# Patient Record
Sex: Male | Born: 1940 | Race: White | Hispanic: No | Marital: Married | Smoking: Current some day smoker
Health system: Southern US, Community
[De-identification: ages and names within clinical notes are randomized; demographics above are authoritative.]

## PROBLEM LIST (undated history)

## (undated) DIAGNOSIS — K219 Gastro-esophageal reflux disease without esophagitis: Secondary | ICD-10-CM

## (undated) DIAGNOSIS — J449 Chronic obstructive pulmonary disease, unspecified: Secondary | ICD-10-CM

## (undated) DIAGNOSIS — J984 Other disorders of lung: Secondary | ICD-10-CM

## (undated) DIAGNOSIS — I10 Essential (primary) hypertension: Secondary | ICD-10-CM

## (undated) DIAGNOSIS — C801 Malignant (primary) neoplasm, unspecified: Secondary | ICD-10-CM

## (undated) DIAGNOSIS — I739 Peripheral vascular disease, unspecified: Secondary | ICD-10-CM

## (undated) DIAGNOSIS — F419 Anxiety disorder, unspecified: Secondary | ICD-10-CM

## (undated) DIAGNOSIS — M1990 Unspecified osteoarthritis, unspecified site: Secondary | ICD-10-CM

## (undated) DIAGNOSIS — E785 Hyperlipidemia, unspecified: Secondary | ICD-10-CM

## (undated) HISTORY — DX: Hyperlipidemia, unspecified: E78.5

## (undated) HISTORY — PX: CATARACT EXTRACTION: SUR2

## (undated) HISTORY — DX: Anxiety disorder, unspecified: F41.9

## (undated) HISTORY — DX: Gastro-esophageal reflux disease without esophagitis: K21.9

## (undated) HISTORY — DX: Chronic obstructive pulmonary disease, unspecified: J44.9

## (undated) HISTORY — DX: Peripheral vascular disease, unspecified: I73.9

## (undated) HISTORY — DX: Unspecified osteoarthritis, unspecified site: M19.90

## (undated) HISTORY — DX: Malignant (primary) neoplasm, unspecified: C80.1

## (undated) HISTORY — DX: Other disorders of lung: J98.4

## (undated) HISTORY — DX: Essential (primary) hypertension: I10

---

## 2002-07-15 ENCOUNTER — Encounter: Payer: Self-pay | Admitting: Emergency Medicine

## 2002-07-15 ENCOUNTER — Emergency Department (HOSPITAL_COMMUNITY): Admission: EM | Admit: 2002-07-15 | Discharge: 2002-07-15 | Payer: Self-pay | Admitting: Emergency Medicine

## 2005-07-09 ENCOUNTER — Ambulatory Visit: Payer: Self-pay | Admitting: Cardiology

## 2006-04-29 ENCOUNTER — Ambulatory Visit: Payer: Self-pay | Admitting: Cardiovascular Disease

## 2008-02-04 ENCOUNTER — Emergency Department (HOSPITAL_COMMUNITY): Admission: EM | Admit: 2008-02-04 | Discharge: 2008-02-04 | Payer: Self-pay | Admitting: Emergency Medicine

## 2010-05-01 LAB — APTT: aPTT: 29 seconds (ref 24–37)

## 2010-05-01 LAB — CBC
HCT: 46.1 % (ref 39.0–52.0)
MCV: 93.5 fL (ref 78.0–100.0)

## 2010-05-01 LAB — DIFFERENTIAL
Basophils Relative: 1 % (ref 0–1)
Lymphocytes Relative: 31 % (ref 12–46)
Neutro Abs: 3.1 10*3/uL (ref 1.7–7.7)
Neutrophils Relative %: 54 % (ref 43–77)

## 2010-05-01 LAB — PROTIME-INR: INR: 1 (ref 0.00–1.49)

## 2010-06-02 NOTE — Assessment & Plan Note (Signed)
[name]                       [place] CARDIOLOGY OFFICE NOTE   [name], [name]                      MRN:          [id number]  DATE:04/29/2006                            DOB:          1940/12/06    [name] is seen today as a new consult at the request of [facility] Department.  Apparently, the patient was seen last year at some  point in [place].  He does not know the doctor who saw him.  He has been  having substernal chest pain.  The patient is an extremely heavy smoker,  over two packs per day.  His chest pain syndrome sounds like angina.  It  is exertional.  It is relieved with rest.  There is some radiation to  the back.  He also may have symptoms of claudication.   The patient unfortunately has been quite depressed.  He had a suicide  attempt back a few months ago.  Apparently, he took too many Xanax.  The  patient did not want to talk about all of his problems.  I tried on  multiple occasions to engage him, but he said he would rather not.  The  patient is married and says his wife is not part of the problem and has  been supportive.   The patient's past medical history is remarkable for some dizziness,  which may have been vertigo as well as some headaches.  He has had some  reflux and epigastric discomfort in the past.   He has been on lisinopril/hydrochlorothiazide for high blood pressure  since about the middle of last year.   I do not have an old EKG on the patient.  He has not been hospitalized  for his heart.  He was hospitalized in 2007 for his drug overdose.   His current medications include lisinopril/hydrochlorothiazide 10/12.5  and p.r.n. nitroglycerin.   His only past medical history is significant for a suicide attempt and  hypertension.   He has not had any previous surgery.   He is married.  They have seven children.  He has over a 60-pack-year  history of smoking.  He does not drink alcohol.  He  did not want to get  into issues regarding his significant depression.   His mother died at the age of 76 of coronary disease.  Father died at  the age of 13 of unknown causes.   A 10-point review of systems is remarkable for a bit of a chronic cough  with productive sputum.  He has not had a fever.  He has not had recent  PFTs.  The patient does describe symptoms that could be consistent with  claudication.  The 10-point review of systems is otherwise only  remarkable for occasional headaches and a chest pain syndrome.   PHYSICAL EXAMINATION:  VITAL SIGNS:  His exam is remarkable for a blood  pressure of 130/70, pulse 88 and regular.  HEENT:  Normal.  NECK:  Carotids are without bruit.  LUNGS:  Clear.  There is no wheezing.  CARDIAC:  There is an S1 and S2 with normal heart  sounds.  ABDOMEN:  Benign.  There are no renal bruits.  EXTREMITIES:  Distal pulses are intact with no edema.  There are no  femoral bruits.   IMPRESSION:  Will have to try to get records from [place].  Unfortunately,  the patient does not recall who his doctor was.  The patient clearly  needs a heart catheterization.  He has multiple coronary risk factors  and is having exertional angina.  The risks of catheterization were  discussed with him, including stroke, need for emergency surgery, and  dye reaction.  He is willing to proceed.  We will do an EKG on him today  to see if there is evidence of previous silent myocardial infarction or  coronary disease.  He will have this routine blood work and the chest x-  ray.   Depending on the results of his cath, he will need extensive risk factor  modification.  It will be difficult to get him to stop smoking, but he  will probably need baseline PFTs.   He will continue to follow up with [facility] Department in  regards to his depression.     Noralyn Pick. Eden Emms, MD, [facility]  Electronically Signed    PCN/MedQ  DD: 04/29/2006  DT: 04/30/2006  Job #: [id number]

## 2010-07-14 ENCOUNTER — Other Ambulatory Visit: Payer: Self-pay

## 2010-07-14 ENCOUNTER — Encounter (HOSPITAL_COMMUNITY)
Admission: RE | Admit: 2010-07-14 | Discharge: 2010-07-14 | Disposition: A | Discharge status: Home / Self Care | Payer: Medicare Other | Source: Ambulatory Visit | Attending: Ophthalmology | Admitting: Ophthalmology

## 2010-07-14 LAB — BASIC METABOLIC PANEL
BUN: 13 mg/dL (ref 6–23)
CO2: 26 mEq/L (ref 19–32)
Chloride: 108 mEq/L (ref 96–112)
Creatinine, Ser: 1.29 mg/dL (ref 0.50–1.35)
Glucose, Bld: 75 mg/dL (ref 70–99)

## 2010-07-20 ENCOUNTER — Ambulatory Visit (HOSPITAL_COMMUNITY)
Admission: RE | Admit: 2010-07-20 | Discharge: 2010-07-20 | Disposition: A | Discharge status: Home / Self Care | Payer: Medicare Other | Source: Ambulatory Visit | Attending: Ophthalmology | Admitting: Ophthalmology

## 2010-07-20 DIAGNOSIS — I10 Essential (primary) hypertension: Secondary | ICD-10-CM | POA: Insufficient documentation

## 2010-07-20 DIAGNOSIS — Z01812 Encounter for preprocedural laboratory examination: Secondary | ICD-10-CM | POA: Insufficient documentation

## 2010-07-20 DIAGNOSIS — H2589 Other age-related cataract: Secondary | ICD-10-CM | POA: Insufficient documentation

## 2010-07-20 DIAGNOSIS — J4489 Other specified chronic obstructive pulmonary disease: Secondary | ICD-10-CM | POA: Insufficient documentation

## 2010-07-20 DIAGNOSIS — J449 Chronic obstructive pulmonary disease, unspecified: Secondary | ICD-10-CM | POA: Insufficient documentation

## 2010-07-20 DIAGNOSIS — Z01810 Encounter for preprocedural cardiovascular examination: Secondary | ICD-10-CM | POA: Insufficient documentation

## 2010-07-24 NOTE — Op Note (Signed)
  NAMEBRENNEN, CAMPER NO.:  [id number]  MEDICAL RECORD NO.:  [id number]  LOCATION:  [facility]                          FACILITY:  [facility]  PHYSICIAN:  Susanne Greenhouse, MD       DATE OF BIRTH:  [birth year]  DATE OF PROCEDURE:  07/20/2010 DATE OF DISCHARGE:                              OPERATIVE REPORT   PREOPERATIVE DIAGNOSIS:  Combined cataract, left eye, diagnosis code 366.19.  POSTOPERATIVE DIAGNOSIS:  Combined cataract, left eye, diagnosis code 366.19.  OPERATION PERFORMED:  Phacoemulsification with posterior chamber intraocular lens implantation, left eye.  SURGEON:  Bonne Dolores. Ernesto Lashway, MD  ANESTHESIA:  General endotracheal anesthesia.  OPERATIVE SUMMARY:  In the preoperative area, dilating drops were placed into the left eye.  The patient was then brought into the operating room where he was placed under general anesthesia.  The eye was then prepped and draped.  Beginning with a 75 blade, a paracentesis port was made at the surgeon's 2 o'clock position.  The anterior chamber was then filled with a 1% nonpreserved lidocaine solution with epinephrine.  This was followed by Viscoat to deepen the chamber.  A small fornix-based peritomy was performed superiorly.  Next, a single iris hook was placed through the limbus superiorly.  A 2.4-mm keratome blade was then used to make a clear corneal incision over the iris hook.  A bent cystotome needle and Utrata forceps were used to create a continuous tear capsulotomy.  Hydrodissection was performed using balanced salt solution on a fine cannula.  The lens nucleus was then removed using phacoemulsification in a quadrant cracking technique.  The cortical material was then removed with irrigation and aspiration.  The capsular bag and anterior chamber were refilled with Provisc.  The wound was widened to approximately 3 mm and a posterior chamber intraocular lens was placed into the capsular bag without difficulty using an  [employer] lens injecting system.  A single 10-0 nylon suture was then used to close the incision as well as stromal hydration.  The Provisc was removed from the anterior chamber and capsular bag with irrigation and aspiration.  At this point, the wounds were tested for leak, which were negative.  The anterior chamber remained deep and stable.  The patient tolerated the procedure well.  There were no operative complications, and he awoke from general anesthesia without problem.  No surgical specimens.  Prosthetic device used is a Lenstec posterior chamber lens, model Softec HD, power of 22.5, serial number is [id number].          ______________________________ Susanne Greenhouse, MD     KEH/MEDQ  D:  07/20/2010  T:  07/21/2010  Job:  [id number]  Electronically Signed by Gemma Payor MD on 07/24/2010 12:37:30 PM

## 2010-10-15 ENCOUNTER — Other Ambulatory Visit: Payer: Self-pay | Admitting: Family Medicine

## 2011-03-01 ENCOUNTER — Ambulatory Visit: Payer: Medicare Other | Admitting: Family Medicine

## 2011-03-14 ENCOUNTER — Encounter: Payer: Self-pay | Admitting: Family Medicine

## 2011-03-14 ENCOUNTER — Ambulatory Visit (INDEPENDENT_AMBULATORY_CARE_PROVIDER_SITE_OTHER): Payer: Medicare Other | Admitting: Family Medicine

## 2011-03-14 VITALS — BP 160/80 | HR 110 | Resp 18 | Ht 66.25 in | Wt 138.0 lb

## 2011-03-14 DIAGNOSIS — J449 Chronic obstructive pulmonary disease, unspecified: Secondary | ICD-10-CM

## 2011-03-14 DIAGNOSIS — M2550 Pain in unspecified joint: Secondary | ICD-10-CM | POA: Insufficient documentation

## 2011-03-14 DIAGNOSIS — E785 Hyperlipidemia, unspecified: Secondary | ICD-10-CM

## 2011-03-14 DIAGNOSIS — C4490 Unspecified malignant neoplasm of skin, unspecified: Secondary | ICD-10-CM

## 2011-03-14 DIAGNOSIS — F418 Other specified anxiety disorders: Secondary | ICD-10-CM | POA: Insufficient documentation

## 2011-03-14 DIAGNOSIS — F411 Generalized anxiety disorder: Secondary | ICD-10-CM

## 2011-03-14 DIAGNOSIS — I10 Essential (primary) hypertension: Secondary | ICD-10-CM | POA: Insufficient documentation

## 2011-03-14 DIAGNOSIS — F419 Anxiety disorder, unspecified: Secondary | ICD-10-CM

## 2011-03-14 DIAGNOSIS — I739 Peripheral vascular disease, unspecified: Secondary | ICD-10-CM

## 2011-03-14 MED ORDER — AMLODIPINE BESYLATE 10 MG PO TABS: 10.0000 mg | ORAL_TABLET | Freq: Every day | ORAL | Status: DC

## 2011-03-14 MED ORDER — TIOTROPIUM BROMIDE MONOHYDRATE 18 MCG IN CAPS: 18.0000 ug | ORAL_CAPSULE | Freq: Every day | RESPIRATORY_TRACT | Status: DC

## 2011-03-14 MED ORDER — ALBUTEROL SULFATE HFA 108 (90 BASE) MCG/ACT IN AERS: 2.0000 | INHALATION_SPRAY | RESPIRATORY_TRACT | Status: DC | PRN

## 2011-03-14 NOTE — Assessment & Plan Note (Signed)
Likely has underlying osteoarthritis. At this point his pain does not bother his regular activities. He is in fairly good shape for his age.

## 2011-03-14 NOTE — Assessment & Plan Note (Signed)
Currently at baseline. Reassure the importance of cutting back on his tobacco or cessation. He will continue Spiriva and ProAir

## 2011-03-14 NOTE — Assessment & Plan Note (Signed)
He currently does not seem to have a problem with his nervousness. We'll continue to follow.

## 2011-03-14 NOTE — Progress Notes (Signed)
  Subjective:    Patient ID: [name], male    DOB: 1940/09/12, 71 y.o.   MRN: [id number]  HPI Patient presents to establish care. Last primary care provider Dr. Margo Aye in [facility]. Medications and history reviewed.  COPD- he's been smoking for greater than 50 years approximately one to one and a half packs per day. He is maintained on Provera and Spiriva. He needs these medications refilled today. He does have shortness of breath at times. He feels his breathing is at baseline currently. He's tried Chantix in the past which did not agree with him. He is also try patches. He's not ready to quit smoking.  Peripheral vascular disease- he had ABIs done about a year ago which suggested decreased blood flow in the right lower extremity. At times he has on and off discomfort in the right leg and a cold feeling.  Hypertension- he has not been taking his Hyzaar as he felt this is making him feel bad along with his Zoloft and Pravachol   Anxiety- states he had some stress regarding his wife in the past. He was feeling really nervous and anxious. That has since passed. He was started on Zoloft by his previous PCP however he only took one tablet and stop this medication   Joint pain- occasionally gets pain in his hips right greater than left when he walks however this does not occur on a regular basis. He does not take any medications for this.  Constipation- he takes over-the-counter stool softeners Skin cancer- he was told he had skin cancer on his head from direct sunlight. He was seen by a dermatologist but this was greater than 10 years ago. He has not follow up with dermatology since then. At this time he does not want to.  Colonoscopy- patient continues to decline  Review of Systems  GEN- denies fatigue, fever, weight loss,weakness, recent illness HEENT- denies eye drainage, change in vision, nasal discharge, CVS- denies chest pain, palpitations RESP- + SOB, cough, wheeze ABD-  denies N/V, change in stools, abd pain, +constipation GU- denies dysuria, hematuria, dribbling, incontinence, +urgency MSK- Occ shoulder joint pain, muscle aches, injury Neuro- denies headache, dizziness, syncope, seizure activity      Objective:   Physical Exam GEN- NAD, alert and oriented x3 HEENT- PERRL, EOMI, non injected sclera, pink conjunctiva, MMM, oropharynx clear, right cataract Neck- Supple, no bruit CVS- Regular rate- HR 90, occ PVC, no murmur RESP-CTAB, good air movement, no wheeze, no rhonchi ABD-NABS,soft, NT, ND EXT- No edema Pulses- Radial 2+, Right leg- DP 2+ PT 1+, Left PT/DP 2+ Psych- not anxious or depressed appearing, no hallucinations, normal mentation and speech Skin- keratoses noted, probable BCC on left forehead, many sunspots       Assessment & Plan:

## 2011-03-14 NOTE — Patient Instructions (Signed)
Start the new blood pressure- amlodipine  Continue the spiriva and Pro-Air  F/U in 3 weeks for your blood pressure

## 2011-03-14 NOTE — Assessment & Plan Note (Signed)
He stopped his medication secondary to feeling bad however he does not know if it was the Zoloft versus pravastatin versus his Hyzaar. He was on lisinopril HCTZ present no to he stopped this as well because of side effects. I will try him on amlodipine instead

## 2011-03-14 NOTE — Assessment & Plan Note (Signed)
He has rare symptoms at this point. I will hold on referral to vascular at this time. I did discuss with him his vascular disease is worsening because of his tobacco

## 2011-03-14 NOTE — Assessment & Plan Note (Signed)
It appears he has had basal cell carcinoma. He does not want any followup at this time. Will continue to watch any concerning lesions

## 2011-03-14 NOTE — Assessment & Plan Note (Signed)
His lipid panel from July of 2012 shows total cholesterol 2:30 HDL 32 LDL 160 triglycerides 188. At this time I will start one medication that time to make sure he does not have side effects. We will then try to reinstate his pravastatin

## 2011-03-16 ENCOUNTER — Telehealth: Payer: Self-pay | Admitting: Family Medicine

## 2011-03-16 MED ORDER — PREDNISONE 20 MG PO TABS: 20.0000 mg | ORAL_TABLET | Freq: Every day | ORAL | Status: AC

## 2011-03-16 MED ORDER — DOXYCYCLINE HYCLATE 100 MG PO TABS: 100.0000 mg | ORAL_TABLET | Freq: Two times a day (BID) | ORAL | Status: AC

## 2011-03-16 NOTE — Telephone Encounter (Signed)
Spoke with pt heavy smoker and COPD , he feels SOB, with wheezing and congestion and rattling his his chest. Also has nasal congestion Will send prednisone and doxycycline

## 2011-03-16 NOTE — Telephone Encounter (Signed)
He was just here on the 27th. He thinks he has a cold. Having some SOB No cough, clear nasal congestion. Suggested Sudafed and saline spray. Do you recommend anything else?  [facility]

## 2011-04-02 ENCOUNTER — Telehealth: Payer: Self-pay | Admitting: Family Medicine

## 2011-04-02 MED ORDER — PREDNISONE 10 MG PO TABS: ORAL_TABLET | ORAL | Status: DC

## 2011-04-02 MED ORDER — LEVOFLOXACIN 750 MG PO TABS: 750.0000 mg | ORAL_TABLET | Freq: Every day | ORAL | Status: DC

## 2011-04-02 NOTE — Telephone Encounter (Addendum)
Mr. Thum wife came in today, he his having increased cough and sputum production, he  improved with the antibiotics previously given but now has recurrent symptoms He has f/u appt on Thursday, will send in levaquin and prednisone and check him in the office

## 2011-04-04 ENCOUNTER — Emergency Department (HOSPITAL_COMMUNITY): Payer: Medicare Other

## 2011-04-04 ENCOUNTER — Other Ambulatory Visit: Payer: Self-pay

## 2011-04-04 ENCOUNTER — Inpatient Hospital Stay (HOSPITAL_COMMUNITY)
Admission: EM | Admit: 2011-04-04 | Discharge: 2011-04-07 | DRG: 208 | Disposition: A | Discharge status: Other Acute Inpatient Hospital | Payer: Medicare Other | Attending: Internal Medicine | Admitting: Internal Medicine

## 2011-04-04 ENCOUNTER — Encounter (HOSPITAL_COMMUNITY): Payer: Self-pay

## 2011-04-04 DIAGNOSIS — M2550 Pain in unspecified joint: Secondary | ICD-10-CM

## 2011-04-04 DIAGNOSIS — J449 Chronic obstructive pulmonary disease, unspecified: Secondary | ICD-10-CM

## 2011-04-04 DIAGNOSIS — F418 Other specified anxiety disorders: Secondary | ICD-10-CM | POA: Diagnosis present

## 2011-04-04 DIAGNOSIS — F29 Unspecified psychosis not due to a substance or known physiological condition: Secondary | ICD-10-CM | POA: Diagnosis present

## 2011-04-04 DIAGNOSIS — R0600 Dyspnea, unspecified: Secondary | ICD-10-CM

## 2011-04-04 DIAGNOSIS — Z720 Tobacco use: Secondary | ICD-10-CM

## 2011-04-04 DIAGNOSIS — E870 Hyperosmolality and hypernatremia: Secondary | ICD-10-CM | POA: Diagnosis not present

## 2011-04-04 DIAGNOSIS — Z79899 Other long term (current) drug therapy: Secondary | ICD-10-CM

## 2011-04-04 DIAGNOSIS — F17200 Nicotine dependence, unspecified, uncomplicated: Secondary | ICD-10-CM | POA: Diagnosis present

## 2011-04-04 DIAGNOSIS — I739 Peripheral vascular disease, unspecified: Secondary | ICD-10-CM | POA: Diagnosis present

## 2011-04-04 DIAGNOSIS — M1710 Unilateral primary osteoarthritis, unspecified knee: Secondary | ICD-10-CM | POA: Diagnosis present

## 2011-04-04 DIAGNOSIS — C4490 Unspecified malignant neoplasm of skin, unspecified: Secondary | ICD-10-CM

## 2011-04-04 DIAGNOSIS — R0902 Hypoxemia: Secondary | ICD-10-CM

## 2011-04-04 DIAGNOSIS — F411 Generalized anxiety disorder: Secondary | ICD-10-CM | POA: Diagnosis present

## 2011-04-04 DIAGNOSIS — E872 Acidosis: Secondary | ICD-10-CM

## 2011-04-04 DIAGNOSIS — J9620 Acute and chronic respiratory failure, unspecified whether with hypoxia or hypercapnia: Secondary | ICD-10-CM

## 2011-04-04 DIAGNOSIS — R0689 Other abnormalities of breathing: Secondary | ICD-10-CM

## 2011-04-04 DIAGNOSIS — E785 Hyperlipidemia, unspecified: Secondary | ICD-10-CM

## 2011-04-04 DIAGNOSIS — F419 Anxiety disorder, unspecified: Secondary | ICD-10-CM

## 2011-04-04 DIAGNOSIS — K219 Gastro-esophageal reflux disease without esophagitis: Secondary | ICD-10-CM | POA: Diagnosis present

## 2011-04-04 DIAGNOSIS — I10 Essential (primary) hypertension: Secondary | ICD-10-CM

## 2011-04-04 DIAGNOSIS — IMO0002 Reserved for concepts with insufficient information to code with codable children: Secondary | ICD-10-CM

## 2011-04-04 DIAGNOSIS — M19049 Primary osteoarthritis, unspecified hand: Secondary | ICD-10-CM | POA: Diagnosis present

## 2011-04-04 DIAGNOSIS — J441 Chronic obstructive pulmonary disease with (acute) exacerbation: Secondary | ICD-10-CM | POA: Diagnosis present

## 2011-04-04 LAB — DIFFERENTIAL
Lymphocytes Relative: 10 % — ABNORMAL LOW (ref 12–46)
Lymphs Abs: 0.9 10*3/uL (ref 0.7–4.0)
Monocytes Absolute: 0.8 10*3/uL (ref 0.1–1.0)
Monocytes Relative: 9 % (ref 3–12)
Neutro Abs: 6.7 10*3/uL (ref 1.7–7.7)
Neutrophils Relative %: 81 % — ABNORMAL HIGH (ref 43–77)

## 2011-04-04 LAB — URINE MICROSCOPIC-ADD ON

## 2011-04-04 LAB — BASIC METABOLIC PANEL
CO2: 29 mEq/L (ref 19–32)
Calcium: 10.1 mg/dL (ref 8.4–10.5)
Creatinine, Ser: 1.17 mg/dL (ref 0.50–1.35)
Glucose, Bld: 88 mg/dL (ref 70–99)

## 2011-04-04 LAB — TROPONIN I: Troponin I: <0.3 ng/mL (ref <0.30)

## 2011-04-04 LAB — URINALYSIS, ROUTINE W REFLEX MICROSCOPIC
Bilirubin Urine: NEGATIVE
Glucose, UA: NEGATIVE mg/dL
Nitrite: NEGATIVE
Specific Gravity, Urine: >1.03 — ABNORMAL HIGH (ref 1.005–1.030)
pH: 5.5 (ref 5.0–8.0)

## 2011-04-04 LAB — CBC
HCT: 51.8 % (ref 39.0–52.0)
MCHC: 34 g/dL (ref 30.0–36.0)

## 2011-04-04 MED ORDER — SODIUM CHLORIDE 0.9 % IV SOLN
INTRAVENOUS | Status: DC
  Administered 2011-04-04: 20:00:00 via INTRAVENOUS

## 2011-04-04 MED ORDER — IOHEXOL 350 MG/ML SOLN
100.0000 mL | Freq: Once | INTRAVENOUS | Status: AC | PRN
  Administered 2011-04-04: 100 mL via INTRAVENOUS

## 2011-04-04 MED ORDER — PREDNISONE 20 MG PO TABS
20.0000 mg | ORAL_TABLET | Freq: Once | ORAL | Status: AC
  Administered 2011-04-04: 20 mg via ORAL
  Filled 2011-04-04: qty 1

## 2011-04-04 MED ORDER — LORAZEPAM 2 MG/ML IJ SOLN
2.0000 mg | Freq: Once | INTRAMUSCULAR | Status: AC
  Administered 2011-04-04: 2 mg via INTRAVENOUS

## 2011-04-04 MED ORDER — ALBUTEROL SULFATE (5 MG/ML) 0.5% IN NEBU
10.0000 mg | INHALATION_SOLUTION | Freq: Once | RESPIRATORY_TRACT | Status: AC
  Administered 2011-04-04: 10 mg via RESPIRATORY_TRACT
  Filled 2011-04-04: qty 1.5
  Filled 2011-04-04: qty 0.5

## 2011-04-04 MED ORDER — IPRATROPIUM BROMIDE 0.02 % IN SOLN
1.0000 mg | Freq: Once | RESPIRATORY_TRACT | Status: AC
  Administered 2011-04-04: 1 mg via RESPIRATORY_TRACT
  Filled 2011-04-04: qty 2.5

## 2011-04-04 MED ORDER — LORAZEPAM 2 MG/ML IJ SOLN
INTRAMUSCULAR | Status: AC
  Administered 2011-04-04: 2 mg via INTRAVENOUS
  Filled 2011-04-04: qty 1

## 2011-04-04 NOTE — ED Notes (Signed)
Sob worsening since Monday, denies cp

## 2011-04-04 NOTE — ED Provider Notes (Signed)
History     CSN: [id number]  Arrival date & time 04/04/11  7829   First MD Initiated Contact with Patient 04/04/11 1955      Chief Complaint  Patient presents with  . Shortness of Breath    HPI Pt was seen at 2030.  Per pt and spouse, c/o gradual onset and worsening of persistent SOB, cough, and wheezing for the past 2 days.  Pt called his PMD, was rx levaquin and prednisone without relief.  Pt has been taking his home MDI without relief.  Pt's spouse states pt has been using her O2 N/C because he has been "so SOB."  Denies CP/SOB, no abd pain, no N/V/D, no back pain, no fevers.    Past Medical History  Diagnosis Date  . Hypertension   . Lung disease   . Chest pain   . COPD (chronic obstructive pulmonary disease)   . GERD (gastroesophageal reflux disease)   . Hyperlipidemia   . Anxiety   . PVD (peripheral vascular disease)     RLE ABI 0.8  . Cancer     skin    Past Surgical History  Procedure Date  . Cataract extraction     left eye    History  Substance Use Topics  . Smoking status: Current Everyday Smoker -- 1.0 packs/day    Types: Cigarettes, Cigars  . Smokeless tobacco: Not on file  . Alcohol Use: No    Review of Systems ROS: Statement: All systems negative except as marked or noted in the HPI; Constitutional: Negative for fever and chills. ; ; Eyes: Negative for eye pain, redness and discharge. ; ; ENMT: Negative for ear pain, hoarseness, nasal congestion, sinus pressure and sore throat. ; ; Cardiovascular: Negative for chest pain, palpitations, diaphoresis, and peripheral edema. ; ; Respiratory: +SOB, cough, wheezing.  Negative for stridor. ; ; Gastrointestinal: Negative for nausea, vomiting, diarrhea, abdominal pain, blood in stool, hematemesis, jaundice and rectal bleeding. . ; ; Genitourinary: Negative for dysuria, flank pain and hematuria. ; ; Musculoskeletal: Negative for back pain and neck pain. Negative for swelling and trauma.; ; Skin: Negative for  pruritus, rash, abrasions, blisters, bruising and skin lesion.; ; Neuro: Negative for headache, lightheadedness and neck stiffness. Negative for weakness, altered level of consciousness , altered mental status, extremity weakness, paresthesias, involuntary movement, seizure and syncope.     Allergies  Review of patient's allergies indicates no known allergies.  Home Medications   Current Outpatient Rx  Name Route Sig Dispense Refill  . ALBUTEROL SULFATE HFA 108 (90 BASE) MCG/ACT IN AERS Inhalation Inhale 2 puffs into the lungs every 4 (four) hours as needed for wheezing. 1 Inhaler 3  . AMLODIPINE BESYLATE 10 MG PO TABS Oral Take 1 tablet (10 mg total) by mouth daily. 30 tablet 2  . IBUPROFEN 200 MG PO TABS Oral Take 200 mg by mouth 3 (three) times daily as needed. For pain    . LEVOFLOXACIN 750 MG PO TABS Oral Take 1 tablet (750 mg total) by mouth daily. 7 tablet 0  . PREDNISONE 10 MG PO TABS  Take 40mg  x 3 days, 30mg  x 3 days, 20mg  x 3 days, then 10mg  x 3 days 30 tablet 0  . TIOTROPIUM BROMIDE MONOHYDRATE 18 MCG IN CAPS Inhalation Place 1 capsule (18 mcg total) into inhaler and inhale daily. 30 capsule 3    BP 144/87  Pulse 110  Temp(Src) 98.2 F (36.8 C) (Oral)  Resp 20  Ht 5\' 7"  (1.702 m)  Wt 137 lb (62.143 kg)  BMI 21.46 kg/m2  SpO2 98%  Physical Exam 2035: Physical examination:  Nursing notes reviewed; Vital signs and O2 SAT reviewed;  Constitutional: Well developed, Well nourished, Well hydrated, In no acute distress; Head:  Normocephalic, atraumatic; Eyes: EOMI, PERRL, No scleral icterus; ENMT: Mouth and pharynx normal, Mucous membranes moist; Neck: Supple, Full range of motion, No lymphadenopathy; Cardiovascular: Tachycardic rate and rhythm, No gallop; Respiratory: Breath sounds coarse, diminished & equal bilaterally, scattered wheezes, no audible wheezing, sitting upright on stretcher, tachypneic, speaking in phrases; Chest: Nontender, Movement normal; Abdomen: Soft, Nontender,  Nondistended, Normal bowel sounds; Extremities: Pulses normal, No tenderness, No edema, No calf edema or asymmetry.; Neuro: AA&Ox3, Major CN grossly intact.  No gross focal motor or sensory deficits in extremities.; Skin: Color normal, Warm, Dry, no rash.    ED Course  Procedures    MDM  MDM Reviewed: previous chart, nursing note and vitals Reviewed previous: ECG Interpretation: labs, ECG and x-ray    Date: 04/04/2011  Rate: 115  Rhythm: sinus tachycardia  QRS Axis: indeterminate  Intervals: normal  ST/T Wave abnormalities: normal  Conduction Disutrbances:none  Narrative Interpretation:   Old EKG Reviewed: unchanged; no significant changes from previous EKG dated 07/14/2010.  Results for orders placed during the hospital encounter of 04/04/11  TROPONIN I      Component Value Range   Troponin I <0.30  <0.30 (ng/mL)  BASIC METABOLIC PANEL      Component Value Range   Sodium 144  135 - 145 (mEq/L)   Potassium 3.5  3.5 - 5.1 (mEq/L)   Chloride 101  96 - 112 (mEq/L)   CO2 29  19 - 32 (mEq/L)   Glucose, Bld 88  70 - 99 (mg/dL)   BUN 23  6 - 23 (mg/dL)   Creatinine, Ser 1.61  0.50 - 1.35 (mg/dL)   Calcium 09.6  8.4 - 10.5 (mg/dL)   GFR calc non Af Amer 61 (*) >90 (mL/min)   GFR calc Af Amer 71 (*) >90 (mL/min)  CBC      Component Value Range   WBC 8.3  4.0 - 10.5 (K/uL)   RBC 5.55  4.22 - 5.81 (MIL/uL)   Hemoglobin 17.6 (*) 13.0 - 17.0 (g/dL)   HCT 04.5  40.9 - 81.1 (%)   MCV 93.3  78.0 - 100.0 (fL)   MCH 31.7  26.0 - 34.0 (pg)   MCHC 34.0  30.0 - 36.0 (g/dL)   RDW 91.4  78.2 - 95.6 (%)   Platelets 297  150 - 400 (K/uL)  DIFFERENTIAL      Component Value Range   Neutrophils Relative 81 (*) 43 - 77 (%)   Neutro Abs 6.7  1.7 - 7.7 (K/uL)   Lymphocytes Relative 10 (*) 12 - 46 (%)   Lymphs Abs 0.9  0.7 - 4.0 (K/uL)   Monocytes Relative 9  3 - 12 (%)   Monocytes Absolute 0.8  0.1 - 1.0 (K/uL)   Eosinophils Relative 0  0 - 5 (%)   Eosinophils Absolute 0.0  0.0 - 0.7  (K/uL)   Basophils Relative 0  0 - 1 (%)   Basophils Absolute 0.0  0.0 - 0.1 (K/uL)   Dg Chest 2 View 04/04/2011  *RADIOLOGY REPORT*  Clinical Data: Cough and shortness of breath  CHEST - 2 VIEW  Comparison: 07/26/2009.  Findings: Artifact overlies chest.  Heart size is normal.  There is atherosclerosis of the aorta. The lungs are hyperinflated consistent  with emphysema.  There is central bronchial thickening but no infiltrate, collapse or effusion. There is a peripheral density at the right apex measuring about 2 cm in size.  This is worrisome for a developing mass.  CT is recommended to evaluate this further.  IMPRESSION: Central bronchial thickening.  No consolidation or collapse.  Question developing 2 cm mass at the right apex laterally.  Chest CT suggested.  Original Report Authenticated By: Thomasenia Sales, M.D.     9:39 PM:  Pt already took prednisone 40mg  today, will give another 20mg  PO now.  Pt stating the nebulizer mask on his face is "smothering me."  Pt moved from sitting on stretcher with legs up to sitting off side of stretcher with legs dangling; pt c/o increasing SOB after this position change, became very tachypneic with access mm use and decreasing O2 Sats to 88% despite wearing O2 N/C.  Pt is agreeable to "try the nebulizer" again, but this time he will hold it in his hand in front of his face.  CT-A chest ordered to f/u CXR possible mass and r/o PE.     10:13 PM:  Pt is now sitting upright on stretcher with hand-held continuous neb in place, giving "thumbs up" to staff.  CT-A chest pending.  May need admit if does not improve after hour long neb, desats when walking, or concerning findings on CT scan.  Sign out to Dr. Effie Shy.         Laray Anger, DO 04/05/11 1404

## 2011-04-04 NOTE — ED Notes (Signed)
Called to CT to administer 2mg  ativan sivp per MD orders, pt tolerated procedure well after medication.

## 2011-04-05 ENCOUNTER — Ambulatory Visit: Payer: Medicare Other | Admitting: Family Medicine

## 2011-04-05 ENCOUNTER — Telehealth: Payer: Self-pay | Admitting: Family Medicine

## 2011-04-05 ENCOUNTER — Encounter (HOSPITAL_COMMUNITY): Payer: Self-pay | Admitting: Internal Medicine

## 2011-04-05 DIAGNOSIS — Z720 Tobacco use: Secondary | ICD-10-CM | POA: Diagnosis present

## 2011-04-05 LAB — BLOOD GAS, ARTERIAL
Acid-Base Excess: 0.2 mmol/L (ref 0.0–2.0)
Acid-Base Excess: 0 mmol/L (ref 0.0–2.0)
Bicarbonate: 26.7 mEq/L — ABNORMAL HIGH (ref 20.0–24.0)
Bicarbonate: 27.5 mEq/L — ABNORMAL HIGH (ref 20.0–24.0)
Delivery systems: POSITIVE
Expiratory PAP: 8
Inspiratory PAP: 20
O2 Content: 4 L/min
O2 Content: 4 L/min
O2 Saturation: 93.8 %
O2 Saturation: 95.5 %
O2 Saturation: 95.7 %
Patient temperature: 37
Patient temperature: 37
pCO2 arterial: 54.2 mmHg — ABNORMAL HIGH (ref 35.0–45.0)
pH, Arterial: 7.205 — ABNORMAL LOW (ref 7.350–7.450)
pO2, Arterial: 81.7 mmHg (ref 80.0–100.0)
pO2, Arterial: 89.6 mmHg (ref 80.0–100.0)

## 2011-04-05 LAB — URINE CULTURE
Colony Count: NO GROWTH
Culture  Setup Time: 201303202245

## 2011-04-05 LAB — MRSA PCR SCREENING: MRSA by PCR: NEGATIVE

## 2011-04-05 LAB — CBC
HCT: 44.3 % (ref 39.0–52.0)
Hemoglobin: 14.2 g/dL (ref 13.0–17.0)
MCV: 95.5 fL (ref 78.0–100.0)
RBC: 4.64 MIL/uL (ref 4.22–5.81)
RDW: 14.7 % (ref 11.5–15.5)
WBC: 5.7 10*3/uL (ref 4.0–10.5)

## 2011-04-05 LAB — BASIC METABOLIC PANEL
CO2: 28 mEq/L (ref 19–32)
Chloride: 106 mEq/L (ref 96–112)
Creatinine, Ser: 1.08 mg/dL (ref 0.50–1.35)
GFR calc Af Amer: 78 mL/min — ABNORMAL LOW (ref >90)
Potassium: 4.5 mEq/L (ref 3.5–5.1)
Sodium: 143 mEq/L (ref 135–145)

## 2011-04-05 LAB — TSH: TSH: 2.493 u[IU]/mL (ref 0.350–4.500)

## 2011-04-05 MED ORDER — IPRATROPIUM BROMIDE 0.02 % IN SOLN
0.5000 mg | Freq: Four times a day (QID) | RESPIRATORY_TRACT | Status: DC
  Administered 2011-04-05 – 2011-04-07 (×9): 0.5 mg via RESPIRATORY_TRACT
  Filled 2011-04-05 (×9): qty 2.5

## 2011-04-05 MED ORDER — LORAZEPAM 2 MG/ML IJ SOLN
0.5000 mg | INTRAMUSCULAR | Status: DC | PRN
  Administered 2011-04-05 – 2011-04-06 (×3): 1 mg via INTRAVENOUS
  Administered 2011-04-06: 0.5 mg via INTRAVENOUS
  Administered 2011-04-06 – 2011-04-07 (×3): 1 mg via INTRAVENOUS
  Filled 2011-04-05 (×7): qty 1

## 2011-04-05 MED ORDER — IPRATROPIUM BROMIDE 0.02 % IN SOLN
0.5000 mg | RESPIRATORY_TRACT | Status: DC | PRN
  Administered 2011-04-06 – 2011-04-07 (×2): 0.5 mg via RESPIRATORY_TRACT
  Filled 2011-04-05 (×3): qty 2.5

## 2011-04-05 MED ORDER — LORAZEPAM 2 MG/ML IJ SOLN
0.5000 mg | INTRAMUSCULAR | Status: DC | PRN
  Administered 2011-04-05 (×3): 1 mg via INTRAVENOUS
  Filled 2011-04-05 (×3): qty 1

## 2011-04-05 MED ORDER — BISACODYL 10 MG RE SUPP: 10.0000 mg | Freq: Every day | RECTAL | Status: DC | PRN

## 2011-04-05 MED ORDER — BIOTENE DRY MOUTH MT LIQD
15.0000 mL | Freq: Two times a day (BID) | OROMUCOSAL | Status: DC
  Administered 2011-04-05 – 2011-04-07 (×4): 15 mL via OROMUCOSAL

## 2011-04-05 MED ORDER — OXYCODONE HCL 5 MG PO TABS: 5.0000 mg | ORAL_TABLET | ORAL | Status: DC | PRN

## 2011-04-05 MED ORDER — SODIUM CHLORIDE 0.9 % IJ SOLN
3.0000 mL | Freq: Two times a day (BID) | INTRAMUSCULAR | Status: DC
  Administered 2011-04-05: 3 mL via INTRAVENOUS
  Filled 2011-04-05: qty 3

## 2011-04-05 MED ORDER — ACETAMINOPHEN 650 MG RE SUPP: 650.0000 mg | Freq: Four times a day (QID) | RECTAL | Status: DC | PRN

## 2011-04-05 MED ORDER — ALBUTEROL SULFATE (5 MG/ML) 0.5% IN NEBU: 5.0000 mg | INHALATION_SOLUTION | RESPIRATORY_TRACT | Status: AC | PRN

## 2011-04-05 MED ORDER — CHLORHEXIDINE GLUCONATE 0.12 % MT SOLN
15.0000 mL | Freq: Two times a day (BID) | OROMUCOSAL | Status: DC
  Administered 2011-04-05 – 2011-04-07 (×4): 15 mL via OROMUCOSAL
  Filled 2011-04-05 (×4): qty 15

## 2011-04-05 MED ORDER — SODIUM CHLORIDE 0.9 % IV SOLN
INTRAVENOUS | Status: DC
  Administered 2011-04-05: 07:00:00 via INTRAVENOUS
  Filled 2011-04-05: qty 1000

## 2011-04-05 MED ORDER — POTASSIUM CHLORIDE IN NACL 20-0.9 MEQ/L-% IV SOLN
INTRAVENOUS | Status: DC
  Administered 2011-04-05 – 2011-04-06 (×4): via INTRAVENOUS

## 2011-04-05 MED ORDER — LEVALBUTEROL HCL 1.25 MG/0.5ML IN NEBU
1.2500 mg | INHALATION_SOLUTION | RESPIRATORY_TRACT | Status: DC | PRN
  Administered 2011-04-06 – 2011-04-07 (×2): 1.25 mg via RESPIRATORY_TRACT
  Filled 2011-04-05 (×3): qty 0.5

## 2011-04-05 MED ORDER — ONDANSETRON HCL 4 MG/2ML IJ SOLN: 4.0000 mg | INTRAMUSCULAR | Status: DC | PRN

## 2011-04-05 MED ORDER — MORPHINE SULFATE 2 MG/ML IJ SOLN: 2.0000 mg | INTRAMUSCULAR | Status: DC | PRN

## 2011-04-05 MED ORDER — NICOTINE 21 MG/24HR TD PT24
21.0000 mg | MEDICATED_PATCH | Freq: Every day | TRANSDERMAL | Status: DC
  Administered 2011-04-05 – 2011-04-07 (×3): 21 mg via TRANSDERMAL
  Filled 2011-04-05 (×3): qty 1

## 2011-04-05 MED ORDER — AMLODIPINE BESYLATE 5 MG PO TABS
10.0000 mg | ORAL_TABLET | Freq: Every day | ORAL | Status: DC
  Administered 2011-04-05 – 2011-04-07 (×3): 10 mg via ORAL
  Filled 2011-04-05 (×3): qty 2

## 2011-04-05 MED ORDER — MOXIFLOXACIN HCL IN NACL 400 MG/250ML IV SOLN
400.0000 mg | INTRAVENOUS | Status: DC
  Administered 2011-04-05 – 2011-04-07 (×3): 400 mg via INTRAVENOUS
  Filled 2011-04-05 (×5): qty 250

## 2011-04-05 MED ORDER — LORAZEPAM 2 MG/ML IJ SOLN: 1.0000 mg | INTRAMUSCULAR | Status: DC | PRN

## 2011-04-05 MED ORDER — SODIUM CHLORIDE 0.9 % IV SOLN: INTRAVENOUS | Status: DC

## 2011-04-05 MED ORDER — ENOXAPARIN SODIUM 40 MG/0.4ML ~~LOC~~ SOLN
40.0000 mg | Freq: Every day | SUBCUTANEOUS | Status: DC
  Administered 2011-04-05 – 2011-04-07 (×3): 40 mg via SUBCUTANEOUS
  Filled 2011-04-05 (×3): qty 0.4

## 2011-04-05 MED ORDER — MOXIFLOXACIN HCL IN NACL 400 MG/250ML IV SOLN
INTRAVENOUS | Status: AC
  Filled 2011-04-05: qty 250

## 2011-04-05 MED ORDER — LORAZEPAM 2 MG/ML IJ SOLN
0.5000 mg | Freq: Once | INTRAMUSCULAR | Status: AC
Start: 2011-04-05 — End: 2011-04-05
  Administered 2011-04-05: 0.5 mg via INTRAVENOUS
  Filled 2011-04-05: qty 1

## 2011-04-05 MED ORDER — GUAIFENESIN ER 600 MG PO TB12
1200.0000 mg | ORAL_TABLET | Freq: Two times a day (BID) | ORAL | Status: DC
  Administered 2011-04-05 – 2011-04-06 (×4): 1200 mg via ORAL
  Filled 2011-04-05: qty 2
  Filled 2011-04-05: qty 1
  Filled 2011-04-05 (×2): qty 2

## 2011-04-05 MED ORDER — POLYETHYLENE GLYCOL 3350 17 G PO PACK: 17.0000 g | PACK | Freq: Every day | ORAL | Status: DC | PRN

## 2011-04-05 MED ORDER — TRAZODONE HCL 50 MG PO TABS: 25.0000 mg | ORAL_TABLET | Freq: Every evening | ORAL | Status: DC | PRN

## 2011-04-05 MED ORDER — METHYLPREDNISOLONE SODIUM SUCC 125 MG IJ SOLR
125.0000 mg | Freq: Four times a day (QID) | INTRAMUSCULAR | Status: DC
  Administered 2011-04-05 – 2011-04-07 (×10): 125 mg via INTRAVENOUS
  Filled 2011-04-05 (×8): qty 2

## 2011-04-05 MED ORDER — ONDANSETRON HCL 4 MG PO TABS: 4.0000 mg | ORAL_TABLET | Freq: Four times a day (QID) | ORAL | Status: DC | PRN

## 2011-04-05 MED ORDER — FLEET ENEMA 7-19 GM/118ML RE ENEM: 1.0000 | ENEMA | Freq: Once | RECTAL | Status: AC | PRN

## 2011-04-05 MED ORDER — LEVALBUTEROL HCL 1.25 MG/0.5ML IN NEBU
1.2500 mg | INHALATION_SOLUTION | Freq: Four times a day (QID) | RESPIRATORY_TRACT | Status: DC
  Administered 2011-04-05 – 2011-04-07 (×9): 1.25 mg via RESPIRATORY_TRACT
  Filled 2011-04-05 (×9): qty 0.5

## 2011-04-05 MED ORDER — ACETAMINOPHEN 325 MG PO TABS: 650.0000 mg | ORAL_TABLET | ORAL | Status: DC | PRN

## 2011-04-05 MED ORDER — METHYLPREDNISOLONE SODIUM SUCC 125 MG IJ SOLR
125.0000 mg | Freq: Once | INTRAMUSCULAR | Status: AC
  Administered 2011-04-05: 125 mg via INTRAVENOUS
  Filled 2011-04-05: qty 2

## 2011-04-05 MED ORDER — POTASSIUM CHLORIDE IN NACL 20-0.9 MEQ/L-% IV SOLN
INTRAVENOUS | Status: DC
  Administered 2011-04-05: 1000 mL via INTRAVENOUS

## 2011-04-05 NOTE — Plan of Care (Signed)
Problem: Consults Goal: Respiratory Problems Patient Education See Patient Education Module for education specifics.  Outcome: Progressing On bipap since admission  Problem: ICU Phase Progression Outcomes Goal: O2 sats trending toward baseline Outcome: Progressing Came to floor on Bipap Goal: Dyspnea controlled at rest Outcome: Not Progressing SOB at rest Goal: Pain controlled with appropriate interventions Outcome: Not Applicable Date Met:  04/05/11 No c/o pain

## 2011-04-05 NOTE — Progress Notes (Signed)
Subjective: This man was admitted with exacerbation of COPD and was in acute respiratory failure with type II respiratory failure. He has required BiPAP.           Physical Exam: Blood pressure 121/67, pulse 94, temperature 98.8 F (37.1 C), temperature source Oral, resp. rate 24, height 5\' 7"  (1.702 m), weight 61.5 kg (135 lb 9.3 oz), SpO2 97.00%. History looks comfortable on the BiPAP, slightly sleepy. Lung fields are actually pretty clear with a few scattered wheezes. Heart sounds are present and normal without murmurs or gallop rhythm. Abdomen is soft and nontender.   Investigations:  Recent Results (from the past 240 hour(s))  MRSA PCR SCREENING     Status: Normal   Collection Time   04/05/11  3:45 AM      Component Value Range Status Comment   MRSA by PCR NEGATIVE  NEGATIVE  Final      Basic Metabolic Panel:  Basename 04/05/11 0420 04/04/11 1957  NA 143 144  K 4.5 3.5  CL 106 101  CO2 28 29  GLUCOSE 98 88  BUN 23 23  CREATININE 1.08 1.17  CALCIUM 8.5 10.1  MG -- --  PHOS -- --   Liver Function Tests:    CBC:  Basename 04/05/11 0420 04/04/11 1957  WBC 5.7 8.3  NEUTROABS -- 6.7  HGB 14.2 17.6*  HCT 44.3 51.8  MCV 95.5 93.3  PLT 225 297    Dg Chest 2 View  04/04/2011  *RADIOLOGY REPORT*  Clinical Data: Cough and shortness of breath  CHEST - 2 VIEW  Comparison: 07/26/2009.  Findings: Artifact overlies chest.  Heart size is normal.  There is atherosclerosis of the aorta. The lungs are hyperinflated consistent with emphysema.  There is central bronchial thickening but no infiltrate, collapse or effusion. There is a peripheral density at the right apex measuring about 2 cm in size.  This is worrisome for a developing mass.  CT is recommended to evaluate this further.  IMPRESSION: Central bronchial thickening.  No consolidation or collapse.  Question developing 2 cm mass at the right apex laterally.  Chest CT suggested.  Original Report Authenticated By: Thomasenia Sales, M.D.   Ct Angio Chest W/cm &/or Wo Cm  04/04/2011  *RADIOLOGY REPORT*  Clinical Data: Shortness of breath; question of mass at the right lung apex on chest radiograph.  CT ANGIOGRAPHY CHEST  Technique:  Multidetector CT imaging of the chest using the standard protocol during bolus administration of intravenous contrast. Multiplanar reconstructed images including MIPs were obtained and reviewed to evaluate the vascular anatomy.  Contrast: OMNIPAQUE IOHEXOL 350 MG/ML IV SOLN  Comparison: Chest radiograph performed earlier today at 07:56 p.m.  Findings: There is no evidence of pulmonary embolus.  A large set of blebs is noted at the right lung apex; scattered smaller left-sided blebs are seen.  Soft tissue densities are seen at both lung apices, most likely reflecting scarring given the appearance.  No suspicious pulmonary nodules are identified.  The lungs are otherwise grossly clear.  There is no evidence of significant focal consolidation, pleural effusion or pneumothorax. No abnormal focal contrast enhancement is seen.  Scattered mediastinal nodes remain normal in size.  No mediastinal lymphadenopathy is seen.  No pericardial effusion is identified. The great vessels are unremarkable in appearance.  No axillary lymphadenopathy is seen.  The visualized portions of the thyroid gland are unremarkable in appearance.  There is a 4.7 cm cyst noted within the right hepatic lobe. Scattered  smaller hypodensities within the liver likely reflects small cysts.  The visualized portions of liver are otherwise unremarkable.  The visualized portions of the spleen are within normal limits.  No acute osseous abnormalities are seen.  IMPRESSION:  1.  No evidence of pulmonary embolus. 2.  Large set of blebs at the right lung apex; scattered smaller left-sided blebs seen. 3.  Soft tissue densities at both lung apices most likely reflect associated scarring, corresponding to the findings on prior chest radiograph.  No  suspicious pulmonary nodules identified. 4.  Scattered hepatic cysts, measuring up to 4.7 cm in size.  Original Report Authenticated By: Tonia Ghent, M.D.      Medications: I have reviewed the patient's current medications.  Impression: 1. Acute respiratory failure secondary to COPD exacerbation. 2. Hypertension. 3. Tobacco abuse, ongoing. 4. History of anxiety.     Plan: 1. ABG now. 2. If ABG looks okay, discontinue BiPAP. 3. Continue with IV steroids and antibiotics.     LOS: 1 day   Wilson Singer Pager [phone]  04/05/2011, 8:01 AM

## 2011-04-05 NOTE — ED Notes (Signed)
Hospitalist at bedside to assess pt.

## 2011-04-05 NOTE — Consult Note (Signed)
NAMECLAUDIO, [name]               ACCOUNT NO.:  [id number]  MEDICAL RECORD NO.:  [id number]  LOCATION:  IC10                          FACILITY:  [facility]  PHYSICIAN:  Xitlali Kastens L. Juanetta Gosling, M.D.DATE OF BIRTH:  [birth year]  DATE OF CONSULTATION: DATE OF DISCHARGE:                                CONSULTATION   REASON FOR CONSULTATION:  COPD.  CONSULTING PHYSICIAN:  [facility].  SUBJECTIVE:  [name] is a 71 year old who came to the emergency room because of increasing shortness of breath.  His wife also has COPD and he has apparently been using her nebulizer and home oxygen.  He has had increased shortness of breath for about a week, and came to the ER in severe respiratory distress with confusion.  He had chest CT at that point and this did not show pneumonia or pulmonary embolism, but did show COPD.  He is in the intensive care unit and has been on BiPAP most of the night.  He is confused.  He has not had any fever, chills, or chest pain.  He cannot give a lot of his history at this point.  PAST MEDICAL HISTORY:  Positive for hypertension, COPD, chest pain, gastroesophageal reflux disease, peripheral vascular disease, hyperlipidemia, and anxiety.  At home, he has been using a nebulizer and oxygen which are not prescribed to him.  He is on amlodipine 10 mg daily, albuterol inhaler, Levaquin 1 daily, prednisone on taper, and Spiriva, it is not totally clear if he takes that or  not.  His social history shows that he has about a 50-60 pack-year smoking history.  He smokes both cigars and cigarettes.  He does not use any alcohol.  He lives at home with his wife.  FAMILY HISTORY:  Positive apparently for COPD, but the extent of that is unknown.  PHYSICAL EXAMINATION:  GENERAL:  Shows that he is on a BiPAP.  He is confused. HEENT:  His pupils are reactive.  Nose and throat are clear.  Mucous membranes are moist. NECK:  Supple without masses. CHEST:  Shows decreased breath  sounds and some wheezes. HEART:  Regular without gallop. ABDOMEN:  Soft.  No masses are felt.  Bowel sounds present and active. EXTREMITIES:  Showed no edema. CENTRAL NERVOUS SYSTEM:  Shows to be very confused.  His white blood count is 8300, hemoglobin 17.6, platelets 297.  His blood gas shows his pH is 7.20, pCO2 of 72, pO2 of 86.  ASSESSMENT:  He has acute respiratory failure.  He does not have definite evidence of pneumonia.  He should be treated with intravenous steroids, antibiotics, inhaled bronchodilators, etc.  Thank you for allowing me to see him with you.     Amiyah Shryock L. Juanetta Gosling, M.D.     ELH/MEDQ  D:  04/05/2011  T:  04/05/2011  Job:  [id number]  cc:   Milinda Antis, MD

## 2011-04-05 NOTE — H&P (Addendum)
PCP:   Milinda Antis, MD, MD   Chief Complaint:  Difficulty breathing for one week  HPI: [name] is an 71 y.o. male.  Multiple medical problems including COPD and ongoing tobacco abuse, has home nebulizers and also uses his wife's home oxygen, has been having progressive shortness of breath for the past week, and marked dyspnea on exertion, and eventually was forced to come to the emergency room was found to be in severe respiratory distress, and a little confused. CT scan does not show any evidence of pneumonia or pulmonary embolus, and despite his protestations that he wants to be discharged home the hospitalist service was called to assist with management.  Patient's breathing seems difficult, and he is in such distress that he can barely talk, and cannot contribute much else to the history. There is no history of fever; he does have a past history of noncompliance but were unable to confirm whether this is an issue in this case.   Rewiew of Systems:  Unable to obtain because of patient's severe respiratory distress   Past Medical History  Diagnosis Date  . Hypertension   . Lung disease   . Chest pain   . COPD (chronic obstructive pulmonary disease)   . GERD (gastroesophageal reflux disease)   . Hyperlipidemia   . Anxiety   . PVD (peripheral vascular disease)     RLE ABI 0.8  . Cancer     skin    Past Surgical History  Procedure Date  . Cataract extraction     left eye    Medications:  HOME MEDS: Prior to Admission medications   Medication Sig Start Date End Date Taking? Authorizing Provider  albuterol (PROAIR HFA) 108 (90 BASE) MCG/ACT inhaler Inhale 2 puffs into the lungs every 4 (four) hours as needed for wheezing. 03/14/11 03/13/12 Yes Salley Scarlet, MD  amLODipine (NORVASC) 10 MG tablet Take 1 tablet (10 mg total) by mouth daily. 03/14/11 03/13/12 Yes Salley Scarlet, MD  ibuprofen (ADVIL,MOTRIN) 200 MG tablet Take 200 mg by mouth 3 (three) times daily  as needed. For pain   Yes Historical Provider, MD  levofloxacin (LEVAQUIN) 750 MG tablet Take 1 tablet (750 mg total) by mouth daily. 04/02/11 04/09/11 Yes Salley Scarlet, MD  predniSONE (DELTASONE) 10 MG tablet Take 40mg  x 3 days, 30mg  x 3 days, 20mg  x 3 days, then 10mg  x 3 days 04/02/11  Yes Salley Scarlet, MD  tiotropium (SPIRIVA) 18 MCG inhalation capsule Place 1 capsule (18 mcg total) into inhaler and inhale daily. 03/14/11  Yes Salley Scarlet, MD     Allergies:  No Known Allergies  Social History:   reports that he has been smoking Cigarettes and Cigars.  He has been smoking about 1 pack per day. He does not have any smokeless tobacco history on file. He reports that he does not drink alcohol or use illicit drugs.  Family History: History reviewed. No pertinent family history.   Physical Exam: Filed Vitals:   04/04/11 1933 04/04/11 1956 04/04/11 2111 04/04/11 2337  BP: 144/87   120/63  Pulse: 121 110  113  Temp: 98.2 F (36.8 C)     TempSrc: Oral     Resp: 24 20    Height: 5\' 7"  (1.702 m)     Weight: 62.143 kg (137 lb)     SpO2: 94% 97% 98% 95%   Blood pressure 120/63, pulse 113, temperature 98.2 F (36.8 C), temperature source Oral, resp. rate  20, height 5\' 7"  (1.702 m), weight 62.143 kg (137 lb), SpO2 95.00%.  GEN:  Severely distressed elderly Caucasian gentleman sitting up on the stretcher wearing 2 L of O2; cooperative with exam PSYCH:  alert and apparently oriented; appears extremely anxious, spiked having received 2 mg of Ativan previously.  HEENT: Mucous membranes pink and anicteric; PERRLA; EOM intact; no cervical lymphadenopathy nor thyromegaly or carotid bruit; no JVD; Breasts:: Not examined CHEST WALL: No tenderness CHEST: Tachypneic, prolonged expiration; generalized wheezes and rhonchi bilaterally  HEART: Regular tachycardia; no murmurs rubs or gallops heard BACK:; no CVA tenderness ABDOMEN:  soft non-tender; no masses, no organomegaly, normal abdominal  bowel sounds; no pannus; no intertriginous candida. Rectal Exam: Not done EXTREMITIES: ; age-appropriate arthropathy of the hands and knees; no edema; no ulcerations. Genitalia: not examined PULSES: 2+ and symmetric SKIN: Normal hydration no rash or ulceration CNS: Cranial nerves 2-12 grossly intact no focal neurologic deficit   Labs & Imaging Results for orders placed during the hospital encounter of 04/04/11 (from the past 48 hour(s))  TROPONIN I     Status: Normal   Collection Time   04/04/11  7:57 PM      Component Value Range Comment   Troponin I <0.30  <0.30 (ng/mL)   BASIC METABOLIC PANEL     Status: Abnormal   Collection Time   04/04/11  7:57 PM      Component Value Range Comment   Sodium 144  135 - 145 (mEq/L)    Potassium 3.5  3.5 - 5.1 (mEq/L)    Chloride 101  96 - 112 (mEq/L)    CO2 29  19 - 32 (mEq/L)    Glucose, Bld 88  70 - 99 (mg/dL)    BUN 23  6 - 23 (mg/dL)    Creatinine, Ser 1.61  0.50 - 1.35 (mg/dL)    Calcium 09.6  8.4 - 10.5 (mg/dL)    GFR calc non Af Amer 61 (*) >90 (mL/min)    GFR calc Af Amer 71 (*) >90 (mL/min)   CBC     Status: Abnormal   Collection Time   04/04/11  7:57 PM      Component Value Range Comment   WBC 8.3  4.0 - 10.5 (K/uL)    RBC 5.55  4.22 - 5.81 (MIL/uL)    Hemoglobin 17.6 (*) 13.0 - 17.0 (g/dL)    HCT 04.5  40.9 - 81.1 (%)    MCV 93.3  78.0 - 100.0 (fL)    MCH 31.7  26.0 - 34.0 (pg)    MCHC 34.0  30.0 - 36.0 (g/dL)    RDW 91.4  78.2 - 95.6 (%)    Platelets 297  150 - 400 (K/uL)   DIFFERENTIAL     Status: Abnormal   Collection Time   04/04/11  7:57 PM      Component Value Range Comment   Neutrophils Relative 81 (*) 43 - 77 (%)    Neutro Abs 6.7  1.7 - 7.7 (K/uL)    Lymphocytes Relative 10 (*) 12 - 46 (%)    Lymphs Abs 0.9  0.7 - 4.0 (K/uL)    Monocytes Relative 9  3 - 12 (%)    Monocytes Absolute 0.8  0.1 - 1.0 (K/uL)    Eosinophils Relative 0  0 - 5 (%)    Eosinophils Absolute 0.0  0.0 - 0.7 (K/uL)    Basophils Relative 0   0 - 1 (%)    Basophils Absolute 0.0  0.0 - 0.1 (K/uL)   URINALYSIS, ROUTINE W REFLEX MICROSCOPIC     Status: Abnormal   Collection Time   04/04/11  9:58 PM      Component Value Range Comment   Color, Urine AMBER (*) YELLOW  BIOCHEMICALS MAY BE AFFECTED BY COLOR   APPearance CLEAR  CLEAR     Specific Gravity, Urine >1.030 (*) 1.005 - 1.030     pH 5.5  5.0 - 8.0     Glucose, UA NEGATIVE  NEGATIVE (mg/dL)    Hgb urine dipstick SMALL (*) NEGATIVE     Bilirubin Urine NEGATIVE  NEGATIVE     Ketones, ur NEGATIVE  NEGATIVE (mg/dL)    Protein, ur NEGATIVE  NEGATIVE (mg/dL)    Urobilinogen, UA 0.2  0.0 - 1.0 (mg/dL)    Nitrite NEGATIVE  NEGATIVE     Leukocytes, UA NEGATIVE  NEGATIVE    URINE MICROSCOPIC-ADD ON     Status: Abnormal   Collection Time   04/04/11  9:58 PM      Component Value Range Comment   Squamous Epithelial / LPF RARE  RARE     WBC, UA 3-6  <3 (WBC/hpf)    RBC / HPF 3-6  <3 (RBC/hpf)    Bacteria, UA FEW (*) RARE    BLOOD GAS, ARTERIAL     Status: Abnormal   Collection Time   04/05/11 12:40 AM      Component Value Range Comment   O2 Content 4.0      Delivery systems NASAL CANNULA      pH, Arterial 7.205 (*) 7.350 - 7.450     pCO2 arterial 72.2 (*) 35.0 - 45.0 (mmHg)    pO2, Arterial 86.7  80.0 - 100.0 (mmHg)    Bicarbonate 27.5 (*) 20.0 - 24.0 (mEq/L)    TCO2 25.0  0 - 100 (mmol/L)    Acid-Base Excess 0.3  0.0 - 2.0 (mmol/L)    O2 Saturation 93.8      Patient temperature 37.0      Collection site RIGHT RADIAL      Drawn by [id number]      Sample type ARTERIAL      Allens test (pass/fail) PASS  PASS     Dg Chest 2 View  04/04/2011  *RADIOLOGY REPORT*  Clinical Data: Cough and shortness of breath  CHEST - 2 VIEW  Comparison: 07/26/2009.  Findings: Artifact overlies chest.  Heart size is normal.  There is atherosclerosis of the aorta. The lungs are hyperinflated consistent with emphysema.  There is central bronchial thickening but no infiltrate, collapse or effusion. There is  a peripheral density at the right apex measuring about 2 cm in size.  This is worrisome for a developing mass.  CT is recommended to evaluate this further.  IMPRESSION: Central bronchial thickening.  No consolidation or collapse.  Question developing 2 cm mass at the right apex laterally.  Chest CT suggested.  Original Report Authenticated By: Thomasenia Sales, M.D.   Ct Angio Chest W/cm &/or Wo Cm  04/04/2011  *RADIOLOGY REPORT*  Clinical Data: Shortness of breath; question of mass at the right lung apex on chest radiograph.  CT ANGIOGRAPHY CHEST  Technique:  Multidetector CT imaging of the chest using the standard protocol during bolus administration of intravenous contrast. Multiplanar reconstructed images including MIPs were obtained and reviewed to evaluate the vascular anatomy.  Contrast: OMNIPAQUE IOHEXOL 350 MG/ML IV SOLN  Comparison: Chest radiograph performed earlier today at 07:56 p.m.  Findings:  There is no evidence of pulmonary embolus.  A large set of blebs is noted at the right lung apex; scattered smaller left-sided blebs are seen.  Soft tissue densities are seen at both lung apices, most likely reflecting scarring given the appearance.  No suspicious pulmonary nodules are identified.  The lungs are otherwise grossly clear.  There is no evidence of significant focal consolidation, pleural effusion or pneumothorax. No abnormal focal contrast enhancement is seen.  Scattered mediastinal nodes remain normal in size.  No mediastinal lymphadenopathy is seen.  No pericardial effusion is identified. The great vessels are unremarkable in appearance.  No axillary lymphadenopathy is seen.  The visualized portions of the thyroid gland are unremarkable in appearance.  There is a 4.7 cm cyst noted within the right hepatic lobe. Scattered smaller hypodensities within the liver likely reflects small cysts.  The visualized portions of liver are otherwise unremarkable.  The visualized portions of the spleen are  within normal limits.  No acute osseous abnormalities are seen.  IMPRESSION:  1.  No evidence of pulmonary embolus. 2.  Large set of blebs at the right lung apex; scattered smaller left-sided blebs seen. 3.  Soft tissue densities at both lung apices most likely reflect associated scarring, corresponding to the findings on prior chest radiograph.  No suspicious pulmonary nodules identified. 4.  Scattered hepatic cysts, measuring up to 4.7 cm in size.  Original Report Authenticated By: Tonia Ghent, M.D.      Assessment Present on Admission:  .Essential hypertension, benign .Anxiety .Acute-on-chronic respiratory failure .COPD acute exacerbation .Tobacco abuse .Hyperlipidemia  PLAN: Admit to the intensive care unit and placed on BiPAP,  hopefully we can get by without intubating this gentleman with severe COPD; will monitor his respiratory status carefully and consult the pulmonary service for assistance with management   We'll give IV steroids and serial nebulized patient; we'll use Xopenex rather than the albuterol because of his severe anxiety.  The when necessary Ativan for his anxiety. Nicotine counseling and nicotine replacement  Other plans as per orders.  Critical care time: 60 minutes.   Wray Goehring 04/05/2011, 2:02 AM

## 2011-04-05 NOTE — Progress Notes (Signed)
UR Chart Review Completed  

## 2011-04-05 NOTE — Telephone Encounter (Signed)
Late documentation, I spoke with [name] and his wife yesterday morning, he was using her home oxygen, I advised him to go to the ER but he refused stating he will come to the office on Thursday. I asked his wife to call EMS if his breathing became any worse she agreed. Noted Admission for COPD which is unfortunate

## 2011-04-05 NOTE — Progress Notes (Signed)
Attempted to see pt for PT eval.  RN states that pt will not be able to tolerate it today due to respiratory distress.  Will check on pt daily and see when pt is medically stable.Marland Kitchen

## 2011-04-05 NOTE — Progress Notes (Deleted)
CRITICAL VALUE ALERT  Critical value received: Troponin 9.21, INR 6.29  Date of notification: 04/05/11  Time of notification:  0645  Critical value read back:yes  Nurse who received alert:  Rockwell Germany rn  MD notified (1st page): DonDiego  Time of first WUJW:[id number]  MD notified (2nd page):  Time of second page:  Responding MD: Delbert Harness  Time MD responded: [phone]

## 2011-04-05 NOTE — Consult Note (Signed)
[id number] 

## 2011-04-05 NOTE — ED Notes (Signed)
Respiratory therapist, kim, called to alert about panic values.   Ph 7.205 co2 72.2 po2 86.7 Bicarb 27.5

## 2011-04-05 NOTE — ED Provider Notes (Signed)
Reevaluated after CT scan- no PE, pneumonia, CHF. No pulmonary nodules. He is dyspneic at rest on nasal cannula oxygen at 4L with O2 sat 91%. He has increased work of breathing. Repeat vital signs are improved, with persistent tachycardia. Patient states he wants to go home. Solu-Medrol, IV ordered. Blood gas, ordered.   Patient is apparently more calm after getting Ativan that was given to sedate him for the CT. Marland Kitchen His wife was with him agrees that he needs to be admitted to the hospital. Wife told me that the patient has been using her oxygen at home. He does not have oxygen. I ordered Solu-Medrol 125 mg IV. The blood gases are  consistent with partially compensated respiratory acidosis. BiPAP was ordered. Admission arranged in holding orders written.  CRITICAL CARE Performed by: Flint Melter   Total critical care time: 30 min Critical care time was exclusive of separately billable procedures and treating other patients.  Critical care was necessary to treat or prevent imminent or life-threatening deterioration.  Critical care was time spent personally by me on the following activities: development of treatment plan with patient and/or surrogate as well as nursing, discussions with consultants, evaluation of patient's response to treatment, examination of patient, obtaining history from patient or surrogate, ordering and performing treatments and interventions, ordering and review of laboratory studies, ordering and review of radiographic studies, pulse oximetry and re-evaluation of patient's condition.    Flint Melter, MD 04/05/11 [phone]

## 2011-04-06 ENCOUNTER — Inpatient Hospital Stay (HOSPITAL_COMMUNITY): Payer: Medicare Other

## 2011-04-06 LAB — BLOOD GAS, ARTERIAL
Bicarbonate: 27.7 mEq/L — ABNORMAL HIGH (ref 20.0–24.0)
Expiratory PAP: 8
Inspiratory PAP: 20
Inspiratory PAP: 20
O2 Saturation: 94.9 %
Patient temperature: 37
TCO2: 25 mmol/L (ref 0–100)
pCO2 arterial: 52.1 mmHg — ABNORMAL HIGH (ref 35.0–45.0)
pH, Arterial: 7.336 — ABNORMAL LOW (ref 7.350–7.450)
pO2, Arterial: 77 mmHg — ABNORMAL LOW (ref 80.0–100.0)

## 2011-04-06 MED ORDER — BOOST / RESOURCE BREEZE PO LIQD
1.0000 | Freq: Two times a day (BID) | ORAL | Status: DC
  Administered 2011-04-06 – 2011-04-07 (×2): 1 via ORAL
  Filled 2011-04-06 (×6): qty 1

## 2011-04-06 NOTE — Progress Notes (Signed)
INITIAL ADULT NUTRITION ASSESSMENT Date: 04/06/2011   Time: 3:42 PM  Reason for Assessment: Nutrition Risk Screen  ASSESSMENT: Male 71 y.o.  Dx: Acute-on-chronic respiratory failure   Past Medical History  Diagnosis Date  . Hypertension   . Lung disease   . Chest pain   . COPD (chronic obstructive pulmonary disease)   . GERD (gastroesophageal reflux disease)   . Hyperlipidemia   . Anxiety   . PVD (peripheral vascular disease)     RLE ABI 0.8  . Cancer     skin    Scheduled Meds:   . amLODipine  10 mg Oral Daily  . antiseptic oral rinse  15 mL Mouth Rinse q12n4p  . chlorhexidine  15 mL Mouth Rinse BID  . enoxaparin  40 mg Subcutaneous Daily  . guaiFENesin  1,200 mg Oral BID  . ipratropium  0.5 mg Nebulization Q6H  . levalbuterol  1.25 mg Nebulization Q6H  . methylPREDNISolone (SOLU-MEDROL) injection  125 mg Intravenous Q6H  . moxifloxacin  400 mg Intravenous Q24H  . nicotine  21 mg Transdermal Daily  . sodium chloride  3 mL Intravenous Q12H   Continuous Infusions:   . 0.9 % NaCl with KCl 20 mEq / L 75 mL/hr at 04/06/11 1000   PRN Meds:.acetaminophen, acetaminophen, albuterol, bisacodyl, ipratropium, levalbuterol, LORazepam, morphine, ondansetron (ZOFRAN) IV, ondansetron, oxyCODONE, polyethylene glycol, sodium phosphate, traZODone, DISCONTD: LORazepam  Ht: 5\' 7"  (170.2 cm)  Wt: 133 lb 6.1 oz (60.5 kg)  Ideal Wt:  66.1 kg (145#) % Ideal Wt: 92%  Usual Wt: 130-135# % Usual Wt: 100%  Body mass index is 20.89 kg/(m^2). Underweight (given pt is >40 yo)  Food/Nutrition Related Hx: Pt has difficulty providing hx due to shortness of breath. Spouse is present to assist. Denies wt loss. Consumes Regular diet at home. Favorite foods are peanut butter crackers and [employer]eps[employer]. Discussed option of downgrading diet to chopped meat and soft vegetables to optimize nutrition intake however pt refuses at this time.    CMP     Component Value Date/Time   NA 143 04/05/2011 0420   K 4.5 04/05/2011 0420   CL 106 04/05/2011 0420   CO2 28 04/05/2011 0420   GLUCOSE 98 04/05/2011 0420   BUN 23 04/05/2011 0420   CREATININE 1.08 04/05/2011 0420   CALCIUM 8.5 04/05/2011 0420   GFRNONAA 68* 04/05/2011 0420   GFRAA 78* 04/05/2011 0420    Intake/Output Summary (Last 24 hours) at 04/06/11 1550 Last data filed at 04/06/11 1000  Gross per 24 hour  Intake   2518 ml  Output   1601 ml  Net    917 ml     Diet Order: Heart Healthy- po 75% breakfast today  Supplements/Tube Feeding:none at this time  IVF:    0.9 % NaCl with KCl 20 mEq / L Last Rate: 75 mL/hr at 04/06/11 1000    Estimated Nutritional Needs:   Kcal:1830-2135 kcal per day Protein:79-88 grams per day Fluid:1.8 Liters per day  NUTRITION DIAGNOSIS: -Underweight (NI-3.1).  Status: Ongoing  RELATED TO: increased energy needs  AS EVIDENCE BY: hx of COPD, and BMI=20.9  MONITORING/EVALUATION(Goals): Monitor: po intake meals and supplements, labs and weight status Goal is to meet >80% of minimum est nutritional needs.  EDUCATION NEEDS: -Education needs addressed  INTERVENTION: -Add Resource Breeze BID between meals  -Offer Snack between meals  Dietitian [phone]  DOCUMENTATION CODES Per approved criteria  -Underweight    Francene Boyers 04/06/2011, 3:42 PM

## 2011-04-06 NOTE — Progress Notes (Signed)
Subjective: He has remained on BiPAP. He looks comfortable. He has had episodes of agitation and his Ativan has been increased and he's more comfortable. He was off BiPAP briefly yesterday but did not tolerated  Objective: Vital signs in last 24 hours: Temp:  [97.5 F (36.4 C)] 97.5 F (36.4 C) (03/22 0347) Pulse Rate:  [66-112] 72  (03/22 0600) Resp:  [19-34] 23  (03/22 0600) BP: (115-151)/(61-88) 121/72 mmHg (03/22 0600) SpO2:  [82 %-99 %] 94 % (03/22 0600) FiO2 (%):  [3 %-30 %] 30 % (03/22 0328) Weight:  [60.5 kg (133 lb 6.1 oz)] 60.5 kg (133 lb 6.1 oz) (03/22 0500) Weight change: -1.643 kg (-3 lb 10 oz) Last BM Date: 04/05/11  Intake/Output from previous day: 03/21 0701 - 03/22 0700 In: 1773 [P.O.:120; I.V.:1653] Out: 1801 [Urine:1800; Stool:1]  PHYSICAL EXAM General appearance: no distress and Mildly sedated Resp: rhonchi bilaterally Cardio: regular rate and rhythm, S1, S2 normal, no murmur, click, rub or gallop GI: soft, non-tender; bowel sounds normal; no masses,  no organomegaly Extremities: extremities normal, atraumatic, no cyanosis or edema  Lab Results:    Basic Metabolic Panel:  Basename 04/05/11 0420 04/04/11 1957  NA 143 144  K 4.5 3.5  CL 106 101  CO2 28 29  GLUCOSE 98 88  BUN 23 23  CREATININE 1.08 1.17  CALCIUM 8.5 10.1  MG -- --  PHOS -- --   Liver Function Tests: No results found for this basename: AST:2,ALT:2,ALKPHOS:2,BILITOT:2,PROT:2,ALBUMIN:2 in the last 72 hours No results found for this basename: LIPASE:2,AMYLASE:2 in the last 72 hours No results found for this basename: AMMONIA:2 in the last 72 hours CBC:  Basename 04/05/11 0420 04/04/11 1957  WBC 5.7 8.3  NEUTROABS -- 6.7  HGB 14.2 17.6*  HCT 44.3 51.8  MCV 95.5 93.3  PLT 225 297   Cardiac Enzymes:  Basename 04/04/11 1957  CKTOTAL --  CKMB --  CKMBINDEX --  TROPONINI <0.30   BNP: No results found for this basename: PROBNP:3 in the last 72 hours D-Dimer: No results  found for this basename: DDIMER:2 in the last 72 hours CBG: No results found for this basename: GLUCAP:6 in the last 72 hours Hemoglobin A1C: No results found for this basename: HGBA1C in the last 72 hours Fasting Lipid Panel: No results found for this basename: CHOL,HDL,LDLCALC,TRIG,CHOLHDL,LDLDIRECT in the last 72 hours Thyroid Function Tests:  Basename 04/05/11 0400  TSH 2.493  T4TOTAL --  FREET4 --  T3FREE --  THYROIDAB --   Anemia Panel: No results found for this basename: VITAMINB12,FOLATE,FERRITIN,TIBC,IRON,RETICCTPCT in the last 72 hours Coagulation: No results found for this basename: LABPROT:2,INR:2 in the last 72 hours Urine Drug Screen: Drugs of Abuse  No results found for this basename: labopia, cocainscrnur, labbenz, amphetmu, thcu, labbarb    Alcohol Level: No results found for this basename: ETH:2 in the last 72 hours Urinalysis:  Basename 04/04/11 2158  COLORURINE AMBER*  LABSPEC >1.030*  PHURINE 5.5  GLUCOSEU NEGATIVE  HGBUR SMALL*  BILIRUBINUR NEGATIVE  KETONESUR NEGATIVE  PROTEINUR NEGATIVE  UROBILINOGEN 0.2  NITRITE NEGATIVE  LEUKOCYTESUR NEGATIVE   Misc. Labs:  ABGS  Basename 04/06/11 0410  PHART 7.286*  PO2ART 78.5*  TCO2 25.0  HCO3 27.7*   CULTURES Recent Results (from the past 240 hour(s))  URINE CULTURE     Status: Normal   Collection Time   04/04/11  9:58 PM      Component Value Range Status Comment   Specimen Description URINE, RANDOM   Final  Special Requests NONE   Final    Culture  Setup Time [id number]   Final    Colony Count NO GROWTH   Final    Culture NO GROWTH   Final    Report Status 04/05/2011 FINAL   Final   MRSA PCR SCREENING     Status: Normal   Collection Time   04/05/11  3:45 AM      Component Value Range Status Comment   MRSA by PCR NEGATIVE  NEGATIVE  Final    Studies/Results: Dg Chest 2 View  04/04/2011  *RADIOLOGY REPORT*  Clinical Data: Cough and shortness of breath  CHEST - 2 VIEW  Comparison:  07/26/2009.  Findings: Artifact overlies chest.  Heart size is normal.  There is atherosclerosis of the aorta. The lungs are hyperinflated consistent with emphysema.  There is central bronchial thickening but no infiltrate, collapse or effusion. There is a peripheral density at the right apex measuring about 2 cm in size.  This is worrisome for a developing mass.  CT is recommended to evaluate this further.  IMPRESSION: Central bronchial thickening.  No consolidation or collapse.  Question developing 2 cm mass at the right apex laterally.  Chest CT suggested.  Original Report Authenticated By: Thomasenia Sales, M.D.   Ct Angio Chest W/cm &/or Wo Cm  04/04/2011  *RADIOLOGY REPORT*  Clinical Data: Shortness of breath; question of mass at the right lung apex on chest radiograph.  CT ANGIOGRAPHY CHEST  Technique:  Multidetector CT imaging of the chest using the standard protocol during bolus administration of intravenous contrast. Multiplanar reconstructed images including MIPs were obtained and reviewed to evaluate the vascular anatomy.  Contrast: OMNIPAQUE IOHEXOL 350 MG/ML IV SOLN  Comparison: Chest radiograph performed earlier today at 07:56 p.m.  Findings: There is no evidence of pulmonary embolus.  A large set of blebs is noted at the right lung apex; scattered smaller left-sided blebs are seen.  Soft tissue densities are seen at both lung apices, most likely reflecting scarring given the appearance.  No suspicious pulmonary nodules are identified.  The lungs are otherwise grossly clear.  There is no evidence of significant focal consolidation, pleural effusion or pneumothorax. No abnormal focal contrast enhancement is seen.  Scattered mediastinal nodes remain normal in size.  No mediastinal lymphadenopathy is seen.  No pericardial effusion is identified. The great vessels are unremarkable in appearance.  No axillary lymphadenopathy is seen.  The visualized portions of the thyroid gland are unremarkable in  appearance.  There is a 4.7 cm cyst noted within the right hepatic lobe. Scattered smaller hypodensities within the liver likely reflects small cysts.  The visualized portions of liver are otherwise unremarkable.  The visualized portions of the spleen are within normal limits.  No acute osseous abnormalities are seen.  IMPRESSION:  1.  No evidence of pulmonary embolus. 2.  Large set of blebs at the right lung apex; scattered smaller left-sided blebs seen. 3.  Soft tissue densities at both lung apices most likely reflect associated scarring, corresponding to the findings on prior chest radiograph.  No suspicious pulmonary nodules identified. 4.  Scattered hepatic cysts, measuring up to 4.7 cm in size.  Original Report Authenticated By: Tonia Ghent, M.D.    Medications:  Scheduled:   . amLODipine  10 mg Oral Daily  . antiseptic oral rinse  15 mL Mouth Rinse q12n4p  . chlorhexidine  15 mL Mouth Rinse BID  . enoxaparin  40 mg Subcutaneous Daily  . guaiFENesin  1,200 mg Oral BID  . ipratropium  0.5 mg Nebulization Q6H  . levalbuterol  1.25 mg Nebulization Q6H  . methylPREDNISolone (SOLU-MEDROL) injection  125 mg Intravenous Q6H  . moxifloxacin  400 mg Intravenous Q24H  . nicotine  21 mg Transdermal Daily  . sodium chloride  3 mL Intravenous Q12H   Continuous:   . 0.9 % NaCl with KCl 20 mEq / L 75 mL/hr at 04/06/11 0500  . DISCONTD: sodium chloride 0.9 % 1,000 mL with potassium chloride 20 mEq infusion 75 mL/hr at 04/05/11 0700   FAO:ZHYQMVHQIONGE, acetaminophen, albuterol, bisacodyl, ipratropium, levalbuterol, LORazepam, morphine, ondansetron (ZOFRAN) IV, ondansetron, oxyCODONE, polyethylene glycol, sodium phosphate, traZODone, DISCONTD: LORazepam  Assesment: He has acute on chronic respiratory failure on the basis of COPD exacerbation. His pH is marginally better this morning. He is still on BiPAP however. Dr. Karilyn Cota and I discussed his situation and we both feel that we should continue with  BiPAP for now but that he is marginal and may end up being intubated and placed on mechanical ventilation Principal Problem:  *Acute-on-chronic respiratory failure Active Problems:  Essential hypertension, benign  Hyperlipidemia  Anxiety  COPD exacerbation  Tobacco abuse    Plan: Continue with BiPAP recheck blood gases continue other treatments    LOS: 2 days   Melinna Linarez L 04/06/2011, 7:34 AM

## 2011-04-06 NOTE — Progress Notes (Signed)
CARE MANAGEMENT NOTE 04/06/2011  Patient:  [name], [name]   Account Number:  [id number]  Date Initiated:  04/06/2011  Documentation initiated by:  Rosemary Holms  Subjective/Objective Assessment:   Pt admitted from home with his spouse. Admitted with Acute Respiratory Failure and was put on BiPAP. Now on nasal canula O2     Action/Plan:   Spoke to wife at bedside. DC with [facility] will come from [place]. (Wife currently uses them for her O2 needs) HH needs will be assessed closer to DC   Anticipated DC Date:  04/10/2011   Anticipated DC Plan:  HOME W HOME HEALTH SERVICES      DC Planning Services  CM consult      Choice offered to / List presented to:     DME arranged  [facility]UL[facility]  [facility]      DME agency  [facility]        [facility] agency  [facility]   Status of service:  In process, will continue to follow Medicare Important Message given?   (If response is "NO", the following Medicare IM given date fields will be blank) Date Medicare IM given:   Date Additional Medicare IM given:    Discharge Disposition:    Per UR Regulation:    If discussed at Long Length of Stay Meetings, dates discussed:    Comments:  04/06/11 1530 Rhyen Mazariego Leanord Hawking RN BSN CM If pt should be DC'd this weekend, please order O2/DME needs from [facility] at [phone]. Wife stated HH needs from [facility] [phone]

## 2011-04-06 NOTE — Evaluation (Signed)
Physical Therapy Evaluation Patient Details Name: [name] MRN: [id number] DOB: 11-02-1940 Today's Date: 04/06/2011  Problem List:  Patient Active Problem List  Diagnoses  . COPD (chronic obstructive pulmonary disease)  . Essential hypertension, benign  . Hyperlipidemia  . PVD (peripheral vascular disease)  . Anxiety  . Joint pain  . Skin cancer  . Acute-on-chronic respiratory failure  . COPD exacerbation  . Tobacco abuse    Past Medical History:  Past Medical History  Diagnosis Date  . Hypertension   . Lung disease   . Chest pain   . COPD (chronic obstructive pulmonary disease)   . GERD (gastroesophageal reflux disease)   . Hyperlipidemia   . Anxiety   . PVD (peripheral vascular disease)     RLE ABI 0.8  . Cancer     skin   Past Surgical History:  Past Surgical History  Procedure Date  . Cataract extraction     left eye    PT Assessment/Plan/Recommendation PT Assessment Clinical Impression Statement: pt is very deconditioned from baseline due to recent illness.Marland KitchenMarland KitchenPTA he had been very active, so he should return to normal ADLs once medical stability is achieved...currently, he needs a walker for gait and gait endurance is very limitedf...his wife and I do not think that he would be agreeable to SNF...hopefully, he will be able to rehab to baseline while in hospital  PT Recommendation/Assessment: Patient will need skilled PT in the acute care venue PT Problem List: Decreased strength;Decreased activity tolerance;Decreased mobility;Cardiopulmonary status limiting activity;Decreased knowledge of precautions;Decreased safety awareness Barriers to Discharge: None PT Therapy Diagnosis : Difficulty walking;Generalized weakness PT Plan PT Frequency: Min 3X/week PT Treatment/Interventions: DME instruction;Gait training;Functional mobility training;Therapeutic activities;Therapeutic exercise;Patient/family education PT Recommendation Follow Up Recommendations: No PT  follow up Equipment Recommended: None recommended by PT PT Goals  Acute Rehab PT Goals PT Goal Formulation: With patient Time For Goal Achievement: 2 weeks Pt will Stand: with modified independence;with no upper extremity support;1 - 2 min PT Goal: Stand - Progress: Goal set today Pt will Ambulate: 16 - 50 feet;with supervision;with least restrictive assistive device PT Goal: Ambulate - Progress: Goal set today Pt will Perform Home Exercise Program: with supervision, verbal cues required/provided PT Goal: Perform Home Exercise Program - Progress: Goal set today  PT Evaluation Precautions/Restrictions  Precautions Precautions: Fall Required Braces or Orthoses: No Restrictions Weight Bearing Restrictions: No Prior Functioning  Home Living Lives With: Spouse Receives Help From: Family Type of Home: House Home Layout: One level Home Access: Stairs to enter Entrance Stairs-Rails: None Entrance Stairs-Number of Steps: 1 Bathroom Shower/Tub: Fish farm manager Equipment: Walker - rolling;Wheelchair - manual Prior Function Level of Independence: Independent with basic ADLs;Independent with homemaking with ambulation;Independent with gait;Independent with transfers Driving: Yes Vocation: Retired Producer, television/film/video: Awake/alert Overall Cognitive Status: Appears within functional limits for tasks assessed Orientation Level: Oriented to person;Oriented to place Sensation/Coordination Sensation Light Touch: Appears Intact Stereognosis: Not tested Hot/Cold: Not tested Proprioception: Appears Intact Coordination Gross Motor Movements are Fluid and Coordinated: Yes Fine Motor Movements are Fluid and Coordinated: Not tested Extremity Assessment RUE Assessment RUE Assessment: Within Functional Limits LUE Assessment LUE Assessment: Within Functional Limits RLE Assessment RLE Assessment: Within Functional Limits LLE Assessment LLE Assessment: Within  Functional Limits Mobility (including Balance) Bed Mobility Bed Mobility: Yes Supine to Sit: 5: Supervision Sit to Supine: 5: Supervision Transfers Transfers: Yes Sit to Stand: 6: Modified independent (Device/Increase time) Stand to Sit: 6: Modified independent (Device/Increase time) Ambulation/Gait Ambulation/Gait: Yes Ambulation/Gait  Assistance: 4: Min assist Ambulation/Gait Assistance Details (indicate cue type and reason): needs assistive device due to mild gait instability...needs assist to guide walker Ambulation Distance (Feet): 12 Feet Assistive device: Rolling walker Gait Pattern: Trunk flexed;Shuffle Stairs: No Corporate treasurer: No  Posture/Postural Control Posture/Postural Control: No significant limitations Balance Balance Assessed: No Exercise    End of Session PT - End of Session Equipment Utilized During Treatment: Gait belt Activity Tolerance: Patient tolerated treatment well Patient left: in chair;with call bell in reach;with family/visitor present Nurse Communication: Mobility status for transfers;Mobility status for ambulation General Behavior During Session: [facility] for tasks performed Cognition: [facility] for tasks performed  Konrad Penta 04/06/2011, 2:58 PM

## 2011-04-06 NOTE — Progress Notes (Signed)
Subjective: This man was admitted with exacerbation of COPD and was in acute respiratory failure with type II respiratory failure. He has required BiPAP. Yesterday morning he was able to be taken off BiPAP but then he had to be put back on because of type II respiratory failure.           Physical Exam: Blood pressure 121/72, pulse 72, temperature 97.5 F (36.4 C), temperature source Axillary, resp. rate 23, height 5\' 7"  (1.702 m), weight 60.5 kg (133 lb 6.1 oz), SpO2 94.00%. History looks comfortable on the BiPAP, slightly sleepy. Lung fields are actually pretty clear with a few scattered wheezes. Heart sounds are present and normal without murmurs or gallop rhythm. Abdomen is soft and nontender.   Investigations:  Recent Results (from the past 240 hour(s))  URINE CULTURE     Status: Normal   Collection Time   04/04/11  9:58 PM      Component Value Range Status Comment   Specimen Description URINE, RANDOM   Final    Special Requests NONE   Final    Culture  Setup Time [id number]   Final    Colony Count NO GROWTH   Final    Culture NO GROWTH   Final    Report Status 04/05/2011 FINAL   Final   MRSA PCR SCREENING     Status: Normal   Collection Time   04/05/11  3:45 AM      Component Value Range Status Comment   MRSA by PCR NEGATIVE  NEGATIVE  Final      Basic Metabolic Panel:  Basename 04/05/11 0420 04/04/11 1957  NA 143 144  K 4.5 3.5  CL 106 101  CO2 28 29  GLUCOSE 98 88  BUN 23 23  CREATININE 1.08 1.17  CALCIUM 8.5 10.1  MG -- --  PHOS -- --   Liver Function Tests:    CBC:  Basename 04/05/11 0420 04/04/11 1957  WBC 5.7 8.3  NEUTROABS -- 6.7  HGB 14.2 17.6*  HCT 44.3 51.8  MCV 95.5 93.3  PLT 225 297    Dg Chest 2 View  04/04/2011  *RADIOLOGY REPORT*  Clinical Data: Cough and shortness of breath  CHEST - 2 VIEW  Comparison: 07/26/2009.  Findings: Artifact overlies chest.  Heart size is normal.  There is atherosclerosis of the aorta. The lungs are  hyperinflated consistent with emphysema.  There is central bronchial thickening but no infiltrate, collapse or effusion. There is a peripheral density at the right apex measuring about 2 cm in size.  This is worrisome for a developing mass.  CT is recommended to evaluate this further.  IMPRESSION: Central bronchial thickening.  No consolidation or collapse.  Question developing 2 cm mass at the right apex laterally.  Chest CT suggested.  Original Report Authenticated By: Thomasenia Sales, M.D.   Ct Angio Chest W/cm &/or Wo Cm  04/04/2011  *RADIOLOGY REPORT*  Clinical Data: Shortness of breath; question of mass at the right lung apex on chest radiograph.  CT ANGIOGRAPHY CHEST  Technique:  Multidetector CT imaging of the chest using the standard protocol during bolus administration of intravenous contrast. Multiplanar reconstructed images including MIPs were obtained and reviewed to evaluate the vascular anatomy.  Contrast: OMNIPAQUE IOHEXOL 350 MG/ML IV SOLN  Comparison: Chest radiograph performed earlier today at 07:56 p.m.  Findings: There is no evidence of pulmonary embolus.  A large set of blebs is noted at the right lung apex; scattered smaller left-sided  blebs are seen.  Soft tissue densities are seen at both lung apices, most likely reflecting scarring given the appearance.  No suspicious pulmonary nodules are identified.  The lungs are otherwise grossly clear.  There is no evidence of significant focal consolidation, pleural effusion or pneumothorax. No abnormal focal contrast enhancement is seen.  Scattered mediastinal nodes remain normal in size.  No mediastinal lymphadenopathy is seen.  No pericardial effusion is identified. The great vessels are unremarkable in appearance.  No axillary lymphadenopathy is seen.  The visualized portions of the thyroid gland are unremarkable in appearance.  There is a 4.7 cm cyst noted within the right hepatic lobe. Scattered smaller hypodensities within the liver  likely reflects small cysts.  The visualized portions of liver are otherwise unremarkable.  The visualized portions of the spleen are within normal limits.  No acute osseous abnormalities are seen.  IMPRESSION:  1.  No evidence of pulmonary embolus. 2.  Large set of blebs at the right lung apex; scattered smaller left-sided blebs seen. 3.  Soft tissue densities at both lung apices most likely reflect associated scarring, corresponding to the findings on prior chest radiograph.  No suspicious pulmonary nodules identified. 4.  Scattered hepatic cysts, measuring up to 4.7 cm in size.  Original Report Authenticated By: Tonia Ghent, M.D.      Medications: I have reviewed the patient's current medications.  Impression: 1. Acute respiratory failure secondary to COPD exacerbation. Requiring BiPAP at the present time. 2. Hypertension. 3. Tobacco abuse, ongoing. 4. History of anxiety.     Plan: 1. Continue on BiPAP for today. Repeat arterial blood gas at 8 AM. 2. Continue with IV steroids and antibiotics. 3. Appreciate Dr. Zadie Cleverly input.     LOS: 2 days   Wilson Singer Pager [phone]  04/06/2011, 7:48 AM

## 2011-04-07 ENCOUNTER — Encounter (HOSPITAL_COMMUNITY): Payer: Self-pay | Admitting: Anesthesiology

## 2011-04-07 ENCOUNTER — Inpatient Hospital Stay (HOSPITAL_COMMUNITY): Payer: Medicare Other

## 2011-04-07 LAB — COMPREHENSIVE METABOLIC PANEL
ALT: 14 U/L (ref 0–53)
AST: 19 U/L (ref 0–37)
Albumin: 2.7 g/dL — ABNORMAL LOW (ref 3.5–5.2)
Alkaline Phosphatase: 66 U/L (ref 39–117)
BUN: 27 mg/dL — ABNORMAL HIGH (ref 6–23)
CO2: 34 mEq/L — ABNORMAL HIGH (ref 19–32)
Calcium: 8.2 mg/dL — ABNORMAL LOW (ref 8.4–10.5)
Chloride: 112 mEq/L (ref 96–112)
Creatinine, Ser: 1.01 mg/dL (ref 0.50–1.35)
GFR calc Af Amer: 85 mL/min — ABNORMAL LOW (ref >90)
GFR calc non Af Amer: 73 mL/min — ABNORMAL LOW (ref >90)
Glucose, Bld: 139 mg/dL — ABNORMAL HIGH (ref 70–99)
Potassium: 4.5 mEq/L (ref 3.5–5.1)
Sodium: 148 mEq/L — ABNORMAL HIGH (ref 135–145)
Total Bilirubin: 0.1 mg/dL — ABNORMAL LOW (ref 0.3–1.2)
Total Protein: 5.8 g/dL — ABNORMAL LOW (ref 6.0–8.3)

## 2011-04-07 LAB — CBC
HCT: 43.1 % (ref 39.0–52.0)
Hemoglobin: 13.7 g/dL (ref 13.0–17.0)
MCH: 31.1 pg (ref 26.0–34.0)
MCHC: 31.8 g/dL (ref 30.0–36.0)
MCV: 98 fL (ref 78.0–100.0)
Platelets: 226 10*3/uL (ref 150–400)
RBC: 4.4 MIL/uL (ref 4.22–5.81)
RDW: 14.6 % (ref 11.5–15.5)
WBC: 5.7 10*3/uL (ref 4.0–10.5)

## 2011-04-07 LAB — BLOOD GAS, ARTERIAL
Acid-Base Excess: 3.4 mmol/L — ABNORMAL HIGH (ref 0.0–2.0)
Acid-Base Excess: 3.6 mmol/L — ABNORMAL HIGH (ref 0.0–2.0)
Bicarbonate: 31.4 mEq/L — ABNORMAL HIGH (ref 20.0–24.0)
Bicarbonate: 30 mEq/L — ABNORMAL HIGH (ref 20.0–24.0)
Bicarbonate: 30 mEq/L — ABNORMAL HIGH (ref 20.0–24.0)
FIO2: 100 %
MECHVT: 500 mL
O2 Content: 3 L/min
O2 Saturation: 92.2 %
O2 Saturation: 99.3 %
PEEP: 5 cmH2O
Patient temperature: 37
Patient temperature: 37
Patient temperature: 37
RATE: 15 resp/min
TCO2: 29.3 mmol/L (ref 0–100)
TCO2: 27.1 mmol/L (ref 0–100)
TCO2: 27.2 mmol/L (ref 0–100)
pCO2 arterial: 69.3 mmHg (ref 35.0–45.0)
pCO2 arterial: 67.1 mmHg (ref 35.0–45.0)
pH, Arterial: 7.194 — CL (ref 7.350–7.450)
pH, Arterial: 7.259 — ABNORMAL LOW (ref 7.350–7.450)
pH, Arterial: 7.273 — ABNORMAL LOW (ref 7.350–7.450)
pO2, Arterial: 66.2 mmHg — ABNORMAL LOW (ref 80.0–100.0)
pO2, Arterial: 497 mmHg — ABNORMAL HIGH (ref 80.0–100.0)

## 2011-04-07 MED ORDER — ETOMIDATE 2 MG/ML IV SOLN
INTRAVENOUS | Status: AC
  Filled 2011-04-07: qty 20

## 2011-04-07 MED ORDER — PROPOFOL 10 MG/ML IV EMUL
INTRAVENOUS | Status: AC
  Filled 2011-04-07: qty 100

## 2011-04-07 MED ORDER — ROCURONIUM BROMIDE 50 MG/5ML IV SOLN
INTRAVENOUS | Status: AC
  Filled 2011-04-07: qty 2

## 2011-04-07 MED ORDER — POTASSIUM CHLORIDE IN NACL 20-0.45 MEQ/L-% IV SOLN
INTRAVENOUS | Status: DC
  Administered 2011-04-07: 09:00:00 via INTRAVENOUS
  Filled 2011-04-07 (×3): qty 1000

## 2011-04-07 MED ORDER — LIDOCAINE HCL (CARDIAC) 20 MG/ML IV SOLN
INTRAVENOUS | Status: AC
  Filled 2011-04-07: qty 5

## 2011-04-07 MED ORDER — MOXIFLOXACIN HCL IN NACL 400 MG/250ML IV SOLN: 400.0000 mg | INTRAVENOUS | Status: DC

## 2011-04-07 MED ORDER — ETOMIDATE 2 MG/ML IV SOLN
INTRAVENOUS | Status: DC | PRN
  Administered 2011-04-07: 12 mg via INTRAVENOUS
  Administered 2011-04-07: 4 mg via INTRAVENOUS

## 2011-04-07 MED ORDER — PROPOFOL 10 MG/ML IV EMUL: 5.0000 ug/kg/min | INTRAVENOUS | Status: DC

## 2011-04-07 MED ORDER — POTASSIUM CHLORIDE IN NACL 20-0.45 MEQ/L-% IV SOLN: 50.0000 mL/h | INTRAVENOUS | Status: DC

## 2011-04-07 MED ORDER — ENOXAPARIN SODIUM 40 MG/0.4ML ~~LOC~~ SOLN: 40.0000 mg | Freq: Every day | SUBCUTANEOUS | Status: DC

## 2011-04-07 MED ORDER — SUCCINYLCHOLINE CHLORIDE 20 MG/ML IJ SOLN
INTRAMUSCULAR | Status: AC
  Filled 2011-04-07: qty 1

## 2011-04-07 MED ORDER — SODIUM CHLORIDE 0.9 % IJ SOLN
INTRAMUSCULAR | Status: AC
  Filled 2011-04-07: qty 6

## 2011-04-07 MED ORDER — LORAZEPAM 2 MG/ML IJ SOLN: 0.5000 mg | INTRAMUSCULAR | Status: DC | PRN

## 2011-04-07 MED ORDER — METHYLPREDNISOLONE SODIUM SUCC 125 MG IJ SOLR: 125.0000 mg | Freq: Four times a day (QID) | INTRAMUSCULAR | Status: DC

## 2011-04-07 NOTE — Progress Notes (Signed)
[facility] medical center transport team here to pick up patient and transport to [facility].

## 2011-04-07 NOTE — Progress Notes (Signed)
Patient out via [facility] transport team.  Report called to [facility].  Writer left message on son's answering machine that patient had left the hospital and is on the way to [facility].  Transport team took patients watch off and left it at the bedside. Patients how number called and step-son stated he would come and pick the watch up soon.

## 2011-04-07 NOTE — Progress Notes (Signed)
Pt became increasingly agitated during night; pt pulled off bi-pap; notified MD, pt currently on [place] at 4 lpm; will continue to monitor pt

## 2011-04-07 NOTE — Progress Notes (Signed)
Paatient's son Teddrick Mallari called back and apologized to the writer and wishes for his father, [name], to be transferred to [facility] today.  Son verbalized he had talked with his brother and they had agreed to allow him to be placed on the ventilator and shipped to [facility].  Dr. Karilyn Cota notified and cobra signed.  Patients wife signed COBRA form and agrees to have patient transformed  Respiratory therapy notified, as well as anesthesia.  Transport set up through [facility] at [facility] [phone]

## 2011-04-07 NOTE — Progress Notes (Signed)
Pt refusing to wear Bipap and attempting to get out of bed.  Helped patient to bsc.  [place] at 3 literes  placed on patient.  Attempting to talk with patient and wife, explaining why the patient needs the BIPAP; not effective in communicating needs of the patient to patient or the wife.  Wife stated, "He was fine this morning until they put him back on the machine".  Dr. Karilyn Cota notified and came to talk with patient and wife.  Pt's son was on the telephone and talked directly with Dr. Karilyn Cota.  Son adamant about patient being moved to [facility].  Assisted patient back to bed and ask patient to wear Bipap.  Pt refused and stated he was ready to leave the hospital.  Dr. Karilyn Cota aware.  ABG to be drawn by RT.  Will notify Dr. Karilyn Cota of the results.

## 2011-04-07 NOTE — Progress Notes (Signed)
The patient's son, Masami Plata, has now agreed for his father, the patient, to be transferred to [facility] intubated and mechanically ventilated.

## 2011-04-07 NOTE — Progress Notes (Addendum)
I have spoken with the  physician at [facility] who is on-call for the intensive care. I discussed the patient's condition and the concern that the patient is not improving from a respiratory standpoint. The patient does not wish to have BiPAP. I've discussed the possibility of transfer to [facility] at the request of the family. After discussing the case with the physician at [facility], he has agreed to accept the patient and we both agree that the patient should be intubated and mechanically ventilated prior to the transfer occurring so that transfer would be safe. The patient's son [name] has told me on the phone that he does not wish the patient to be intubated and he wants to sign out the patient AGAINST MEDICAL ADVICE. The patient's wife his differing  decisions to [employer]. I have told [name] that, in my medical opinion, this is extremely unwise and unsafe as this could result in further deterioration in the patient's condition, including death. Unfortunately, [name] does not agree with this and says he is on his way to take his father to the emergency room at [facility] himself, leaving this hospital AGAINST MEDICAL ADVICE.

## 2011-04-07 NOTE — Anesthesia Procedure Notes (Signed)
Procedure Name: Intubation Date/Time: 04/07/2011 2:39 PM Performed by: Franco Nones Pre-anesthesia Checklist: Patient identified, Emergency Drugs available, Suction available, Patient being monitored and Timeout performed Patient Re-evaluated:Patient Re-evaluated prior to inductionOxygen Delivery Method: Ambu bag Preoxygenation: Pre-oxygenation with 100% oxygen Intubation Type: IV induction and Cricoid Pressure applied Laryngoscope Size: Mac and 3 Grade View: Grade I Tube type: Oral Tube size: 8.0 mm Number of attempts: 1 Placement Confirmation: ETT inserted through vocal cords under direct vision,  CO2 detector and breath sounds checked- equal and bilateral Secured at: 23 cm Comments: 12mg  Amidate IV at 1435 pre  And 4 mg post intubation

## 2011-04-07 NOTE — Progress Notes (Signed)
Subjective: This man was admitted with exacerbation of COPD and was in acute respiratory failure with type II respiratory failure. He has required BiPAP. Yesterday he was better and has been off the BiPAP in the daytime. In the nighttime he refused to have the BiPAP on him.           Physical Exam: Blood pressure 145/74, pulse 98, temperature 98.4 F (36.9 C), temperature source Axillary, resp. rate 25, height 5\' 7"  (1.702 m), weight 65.2 kg (143 lb 11.8 oz), SpO2 94.00%. Lung fields are actually pretty clear with a few scattered wheezes. Heart sounds are present and normal without murmurs or gallop rhythm. Abdomen is soft and nontender.   Investigations:  Recent Results (from the past 240 hour(s))  URINE CULTURE     Status: Normal   Collection Time   04/04/11  9:58 PM      Component Value Range Status Comment   Specimen Description URINE, RANDOM   Final    Special Requests NONE   Final    Culture  Setup Time [id number]   Final    Colony Count NO GROWTH   Final    Culture NO GROWTH   Final    Report Status 04/05/2011 FINAL   Final   MRSA PCR SCREENING     Status: Normal   Collection Time   04/05/11  3:45 AM      Component Value Range Status Comment   MRSA by PCR NEGATIVE  NEGATIVE  Final      Basic Metabolic Panel:  Basename 04/07/11 0507 04/05/11 0420  NA 148* 143  K 4.5 4.5  CL 112 106  CO2 34* 28  GLUCOSE 139* 98  BUN 27* 23  CREATININE 1.01 1.08  CALCIUM 8.2* 8.5  MG -- --  PHOS -- --   Liver Function Tests:    CBC:  Basename 04/07/11 0507 04/05/11 0420 04/04/11 1957  WBC 5.7 5.7 --  NEUTROABS -- -- 6.7  HGB 13.7 14.2 --  HCT 43.1 44.3 --  MCV 98.0 95.5 --  PLT 226 225 --    Dg Chest Port 1 View  04/06/2011  *RADIOLOGY REPORT*  Clinical Data: Respiratory failure  PORTABLE CHEST - 1 VIEW  Comparison: Chest radiograph 04/04/2011  Findings: Normal mediastinum and cardiac silhouette.  Lungs are hyperinflated.  No effusion, infiltrate, or  pneumothorax.  IMPRESSION: Emphysematous change.  No acute findings.  Original Report Authenticated By: Genevive Bi, M.D.      Medications: I have reviewed the patient's current medications.  Impression: 1. Acute respiratory failure secondary to COPD exacerbation. Now off BiPAP. 2. Hypertension. 3. Tobacco abuse, ongoing. 4. History of anxiety. 5. Hypernatremia.    Plan: 1. Arterial blood gas this morning. 2. Change IV fluids to half normal saline. 3. If blood gas looks reasonable I think we could probably remove him to the floor.     LOS: 3 days   Wilson Singer Pager [phone]  04/07/2011, 7:22 AM

## 2011-04-07 NOTE — Progress Notes (Signed)
CRITICAL VALUE ALERT  Critical value received:  abg  Date of notification:  04-07-2011  Time of notification:  0856  Critical value read back:yes  Nurse who received alert:  Gypsy Decant, RN  MD notified (1st page): [phone]  Time of first page:  [phone]  MD notified (2nd page):  Time of second page:  Responding MD:  Dr. Karilyn Cota  Time MD responded:  [phone]

## 2011-04-07 NOTE — Progress Notes (Signed)
Patient's son [name] called the room and wanted to talk to Clinical research associate on the phone. Step-mother gave the phone to the Clinical research associate.  Bad connection with call, number given so son could call the desk.  Son, [name] called to talk to Clinical research associate and expressed he wanted his father to be transferred to [facility].  Explained to the son the conditions of patient coming to [facility] with patient being intubated.  Patients son stated, "He will not go on the ventilator". "If anything happens to my father while he is in that hospital, I will sue the hospital for everything you have".  Explained to son I would take his number and have the Doctor call him.  He continued to tell me he had a lawyer and he knew what he was talking about.  Relayed once more that the doctor would call him.  Dr. Karilyn Cota notified and will come to the floor in approx. 10 mins.

## 2011-04-07 NOTE — Discharge Summary (Signed)
Physician Discharge Summary  Patient ID: [name] MRN: [id number] DOB/AGE: December 08, 1940 71 y.o. Primary Care Physician:[place], Kingsley Spittle, MD, MD Admit date: 04/04/2011 Discharge date: 04/07/2011    Discharge Diagnoses:  1. Acute on chronic respiratory failure secondary to exacerbation of COPD requiring intubation and mechanical ventilation. 2. Hypertension. 3. COPD. 4. Tobacco abuse, still ongoing. 5. Anxiety.   Medication List  As of 04/07/2011  2:33 PM   STOP taking these medications         albuterol 108 (90 BASE) MCG/ACT inhaler      levofloxacin 750 MG tablet      predniSONE 10 MG tablet      tiotropium 18 MCG inhalation capsule         TAKE these medications         0.45 % NaCl with KCl 20 mEq / L 20-0.45 MEQ/L-%   Inject 50 mL/hr into the vein continuous.      amLODipine 10 MG tablet   Commonly known as: NORVASC   Take 1 tablet (10 mg total) by mouth daily.      enoxaparin 40 MG/0.4ML injection   Commonly known as: LOVENOX   Inject 0.4 mLs (40 mg total) into the skin daily.      ibuprofen 200 MG tablet   Commonly known as: ADVIL,MOTRIN   Take 200 mg by mouth 3 (three) times daily as needed. For pain      LORazepam 2 MG/ML injection   Commonly known as: ATIVAN   Inject 0.25-0.5 mLs (0.5-1 mg total) into the vein every 2 (two) hours as needed for anxiety.      methylPREDNISolone sodium succinate 125 mg/2 mL injection   Commonly known as: SOLU-MEDROL   Inject 2 mLs (125 mg total) into the vein every 6 (six) hours.      moxifloxacin 400 MG/250ML IVPB   Commonly known as: AVELOX   Inject 250 mLs (400 mg total) into the vein daily.            Discharged Condition: Stable, on ventilator.    Consults: Pulmonology, Dr. Juanetta Gosling.  Significant Diagnostic Studies: Dg Chest 2 View  04/04/2011  *RADIOLOGY REPORT*  Clinical Data: Cough and shortness of breath  CHEST - 2 VIEW  Comparison: 07/26/2009.  Findings: Artifact overlies chest.  Heart size is  normal.  There is atherosclerosis of the aorta. The lungs are hyperinflated consistent with emphysema.  There is central bronchial thickening but no infiltrate, collapse or effusion. There is a peripheral density at the right apex measuring about 2 cm in size.  This is worrisome for a developing mass.  CT is recommended to evaluate this further.  IMPRESSION: Central bronchial thickening.  No consolidation or collapse.  Question developing 2 cm mass at the right apex laterally.  Chest CT suggested.  Original Report Authenticated By: Thomasenia Sales, M.D.   Ct Angio Chest W/cm &/or Wo Cm  04/04/2011  *RADIOLOGY REPORT*  Clinical Data: Shortness of breath; question of mass at the right lung apex on chest radiograph.  CT ANGIOGRAPHY CHEST  Technique:  Multidetector CT imaging of the chest using the standard protocol during bolus administration of intravenous contrast. Multiplanar reconstructed images including MIPs were obtained and reviewed to evaluate the vascular anatomy.  Contrast: OMNIPAQUE IOHEXOL 350 MG/ML IV SOLN  Comparison: Chest radiograph performed earlier today at 07:56 p.m.  Findings: There is no evidence of pulmonary embolus.  A large set of blebs is noted at the right lung apex; scattered smaller  left-sided blebs are seen.  Soft tissue densities are seen at both lung apices, most likely reflecting scarring given the appearance.  No suspicious pulmonary nodules are identified.  The lungs are otherwise grossly clear.  There is no evidence of significant focal consolidation, pleural effusion or pneumothorax. No abnormal focal contrast enhancement is seen.  Scattered mediastinal nodes remain normal in size.  No mediastinal lymphadenopathy is seen.  No pericardial effusion is identified. The great vessels are unremarkable in appearance.  No axillary lymphadenopathy is seen.  The visualized portions of the thyroid gland are unremarkable in appearance.  There is a 4.7 cm cyst noted within the right  hepatic lobe. Scattered smaller hypodensities within the liver likely reflects small cysts.  The visualized portions of liver are otherwise unremarkable.  The visualized portions of the spleen are within normal limits.  No acute osseous abnormalities are seen.  IMPRESSION:  1.  No evidence of pulmonary embolus. 2.  Large set of blebs at the right lung apex; scattered smaller left-sided blebs seen. 3.  Soft tissue densities at both lung apices most likely reflect associated scarring, corresponding to the findings on prior chest radiograph.  No suspicious pulmonary nodules identified. 4.  Scattered hepatic cysts, measuring up to 4.7 cm in size.  Original Report Authenticated By: Tonia Ghent, M.D.   Dg Chest Port 1 View  04/06/2011  *RADIOLOGY REPORT*  Clinical Data: Respiratory failure  PORTABLE CHEST - 1 VIEW  Comparison: Chest radiograph 04/04/2011  Findings: Normal mediastinum and cardiac silhouette.  Lungs are hyperinflated.  No effusion, infiltrate, or pneumothorax.  IMPRESSION: Emphysematous change.  No acute findings.  Original Report Authenticated By: Genevive Bi, M.D.    Lab Results: Basic Metabolic Panel:  Basename 04/07/11 0507 04/05/11 0420  NA 148* 143  K 4.5 4.5  CL 112 106  CO2 34* 28  GLUCOSE 139* 98  BUN 27* 23  CREATININE 1.01 1.08  CALCIUM 8.2* 8.5  MG -- --  PHOS -- --   Liver Function Tests:  Basename 04/07/11 0507  AST 19  ALT 14  ALKPHOS 66  BILITOT 0.1*  PROT 5.8*  ALBUMIN 2.7*     CBC:  Basename 04/07/11 0507 04/05/11 0420 04/04/11 1957  WBC 5.7 5.7 --  NEUTROABS -- -- 6.7  HGB 13.7 14.2 --  HCT 43.1 44.3 --  MCV 98.0 95.5 --  PLT 226 225 --    Recent Results (from the past 240 hour(s))  URINE CULTURE     Status: Normal   Collection Time   04/04/11  9:58 PM      Component Value Range Status Comment   Specimen Description URINE, RANDOM   Final    Special Requests NONE   Final    Culture  Setup Time [id number]   Final    Colony Count NO  GROWTH   Final    Culture NO GROWTH   Final    Report Status 04/05/2011 FINAL   Final   MRSA PCR SCREENING     Status: Normal   Collection Time   04/05/11  3:45 AM      Component Value Range Status Comment   MRSA by PCR NEGATIVE  NEGATIVE  Final      Hospital Course: This 71 year old man was admitted with symptoms of dyspnea for one week prior to admission. When he was seen in the emergency room he was in type II respiratory failure with hypercapnia and hypoxia. At this time he did not wish to be admitted  to the hospital but he finally agreed. In view of his type II respiratory failure, he was put on BiPAP which he tolerated. The following day he seemed to be better enough to stop the BiPAP but this only lasted for a few hours. He was then put back on BiPAP. Yesterday and today in the first days that he overall has been off BiPAP but he is becoming fatigued and his PCO2 was rising. He is also getting very anxious and intravenous Ativan had to be given. I've discussed the patient's medical problems with his wife and his son over the phone. I've explained the severity of her COPD. The family wanted the patient to be transferred to [facility]. I've spoken with the physician at [facility] in the critical care team and he has agreed to accept the transfer. Initially the patient's son, [name], did not wish the patient to go to [facility] intubated. He has now changed his mind. Discharge Exam: Blood pressure 155/80, pulse 102, temperature 98.1 F (36.7 C), temperature source Oral, resp. rate 26, height 5\' 7"  (1.702 m), weight 65.2 kg (143 lb 11.8 oz), SpO2 99.00%.  does not look well. He is drowsy. Lung fields show bilateral wheezing. There are no crackles or bronchial breathing. Heart sounds are present and normal. He is   drowsy, likely secondary to retention of CO2. There are no obvious focal neurological signs. Disposition:  transfer to [facility] intubated and mechanically  ventilated.  Discharge Orders    Future Orders Please Complete By Expires   Diet - low sodium heart healthy      Increase activity slowly         Follow-up Information    Follow up with Milinda Antis, MD .         SignedWilson Singer Pager [phone]  04/07/2011, 2:33 PM

## 2011-04-08 NOTE — Progress Notes (Signed)
NAMEJERIAH, [name]               ACCOUNT NO.:  [id number]  MEDICAL RECORD NO.:  [id number]  LOCATION:  IC10                          FACILITY:  [facility]  PHYSICIAN:  [name], M.D.DATE OF BIRTH:  [birth year]  DATE OF PROCEDURE: DATE OF DISCHARGE:  04/07/2011                                PROGRESS NOTE   [name] is a patient of the hospitalist who is admitted with COPD exacerbation.  He has been on BiPAP, then came off the BiPAP and did not want to wear last night.  Last night.  He has done pretty well.  He is more alert than he has been.  He is still coughing a lot.  He has no other new complaints.  His laboratory work is pending.  PHYSICAL EXAMINATION:  VITAL SIGNS:  As recorded in the electronic record. CHEST:  Rhonchi and expiratory wheezes. HEART:  Regular. ABDOMEN:  Soft. GENERAL:  He is still somewhat confused but alert.  His oxygen saturation is running in the mid 90s on nasal cannula.  ASSESSMENT:  He has chronic obstructive pulmonary disease with acute respiratory failure which is improving.  Laboratory work still pending. I did not want to change any of his medicines today.     [name], M.D.     ELH/MEDQ  D:  04/07/2011  T:  04/07/2011  Job:  [id number]

## 2011-04-12 ENCOUNTER — Encounter: Payer: Self-pay | Admitting: Family Medicine

## 2011-04-16 ENCOUNTER — Telehealth: Payer: Self-pay | Admitting: Family Medicine

## 2011-04-16 MED ORDER — ALBUTEROL SULFATE (2.5 MG/3ML) 0.083% IN NEBU: 2.5000 mg | INHALATION_SOLUTION | RESPIRATORY_TRACT | Status: DC | PRN

## 2011-04-16 MED ORDER — NICOTINE 21 MG/24HR TD PT24: 1.0000 | MEDICATED_PATCH | TRANSDERMAL | Status: AC

## 2011-04-16 NOTE — Telephone Encounter (Signed)
I spoke with pt he is doing well, he was not sure about all of his meds I sent in nicotine patch for him- he is willing to try Use nebulizer as needed He will complete course of antibotics Continue symbicort No oxygen at home F/U on Thursday

## 2011-04-19 ENCOUNTER — Encounter: Payer: Self-pay | Admitting: Family Medicine

## 2011-04-19 ENCOUNTER — Ambulatory Visit (INDEPENDENT_AMBULATORY_CARE_PROVIDER_SITE_OTHER): Payer: Medicare Other | Admitting: Family Medicine

## 2011-04-19 VITALS — BP 130/64 | HR 80 | Resp 16 | Ht 66.25 in | Wt 138.0 lb

## 2011-04-19 DIAGNOSIS — R609 Edema, unspecified: Secondary | ICD-10-CM

## 2011-04-19 DIAGNOSIS — F17200 Nicotine dependence, unspecified, uncomplicated: Secondary | ICD-10-CM

## 2011-04-19 DIAGNOSIS — Z720 Tobacco use: Secondary | ICD-10-CM

## 2011-04-19 DIAGNOSIS — J449 Chronic obstructive pulmonary disease, unspecified: Secondary | ICD-10-CM

## 2011-04-19 DIAGNOSIS — F419 Anxiety disorder, unspecified: Secondary | ICD-10-CM

## 2011-04-19 DIAGNOSIS — I10 Essential (primary) hypertension: Secondary | ICD-10-CM

## 2011-04-19 DIAGNOSIS — F411 Generalized anxiety disorder: Secondary | ICD-10-CM

## 2011-04-19 DIAGNOSIS — R600 Localized edema: Secondary | ICD-10-CM

## 2011-04-19 MED ORDER — FUROSEMIDE 20 MG PO TABS: 20.0000 mg | ORAL_TABLET | Freq: Every day | ORAL | Status: DC

## 2011-04-19 MED ORDER — ALPRAZOLAM 0.25 MG PO TABS: 0.2500 mg | ORAL_TABLET | Freq: Three times a day (TID) | ORAL | Status: DC | PRN

## 2011-04-19 NOTE — Patient Instructions (Signed)
F/U 2 months  For breathing Continue current medications Call if you need an allergy medicine  Keep up the good work with the smoking.

## 2011-04-19 NOTE — Assessment & Plan Note (Signed)
Pt has quit tobacco, hopefully he will stay off tobacco products

## 2011-04-19 NOTE — Progress Notes (Signed)
  Subjective:    Patient ID: [name], male    DOB: 03/03/1940, 71 y.o.   MRN: [id number]  HPI  Pt here to f/u hospital admission, admitted with severe COPD exacerbation requiring BIPAP then intubation and ventilation. He was transferred to [facility] where he recovered. Discharged without oxygen, started on symbicort, in addition to his previous spiriva and albuterol. Has a [facility] nurse weekly. Feels he is doing great, quit tobacco.  Anxiety - given xanax for anxiety during admission this helped calm him a lot, currently out of meds, he states he has been trying to change things since his admission, denies feeling depressed or overly anxious although his wife told me he was acting like a busy body and seemed very anxious to her.  Review of Systems  GEN- denies fatigue, fever, weight loss,weakness, recent illness HEENT- denies eye drainage, change in vision, nasal discharge, CVS- denies chest pain, palpitations, +leg swelling since admission RESP- denies SOB, +cough, wheeze ABD- denies N/V, change in stools, abd pain GU- denies dysuria, hematuria, dribbling, incontinence MSK- denies joint pain, muscle aches, injury Neuro- denies headache, dizziness, syncope, seizure activity       Objective:   Physical Exam GEN- NAD, alert and oriented x3 HEENT- PERRL, EOMI, non injected sclera, pink conjunctiva, MMM, oropharynx clear Neck- Supple,  CVS- RRR, no murmur RESP-few scattered expiratory wheeze, normal WOB, no retractions,no rales, no rhonchi ABD-NABS,soft, NT,ND EXT- 1+ pitting edema lower ext Pulses- Radial, DP- 2+ Psych- began to cry when discussing tobacco use, not anxious appearing, not depressed appearing       Assessment & Plan:

## 2011-04-19 NOTE — Assessment & Plan Note (Signed)
B/P improved.

## 2011-04-19 NOTE — Assessment & Plan Note (Signed)
I think this is secondary to fluid resuscitation as no previous edema. Will give him low dose lasix.

## 2011-04-19 NOTE — Assessment & Plan Note (Signed)
Will continue low dose xanax, he does not want meds changed

## 2011-04-19 NOTE — Assessment & Plan Note (Signed)
Continue current inhalers, pt quit tobacco since admission,states he does not need any further meds to help him

## 2011-04-26 ENCOUNTER — Telehealth: Payer: Self-pay | Admitting: Family Medicine

## 2011-04-26 NOTE — Telephone Encounter (Signed)
I spoke with pt, legs are swollen but he feels good, he did not know about the water pill I did speak with his HH nurse today, BP was 130/60's He will try the lasix x 1 week, if not improved, consider decreasing or changing his norvasc

## 2011-05-04 ENCOUNTER — Other Ambulatory Visit: Payer: Self-pay | Admitting: Family Medicine

## 2011-05-04 ENCOUNTER — Telehealth: Payer: Self-pay | Admitting: Family Medicine

## 2011-05-22 ENCOUNTER — Telehealth: Payer: Self-pay

## 2011-05-22 ENCOUNTER — Telehealth: Payer: Self-pay | Admitting: Family Medicine

## 2011-05-22 NOTE — Telephone Encounter (Signed)
He needs to be seen, please give appt

## 2011-05-22 NOTE — Telephone Encounter (Signed)
Called pt with no answer.

## 2011-05-22 NOTE — Telephone Encounter (Signed)
Spoke with pt he needs an appt to be seen, states he has a chest cold He will come in tomorrow at 10:30am

## 2011-05-23 ENCOUNTER — Encounter: Payer: Self-pay | Admitting: Family Medicine

## 2011-05-23 ENCOUNTER — Ambulatory Visit (INDEPENDENT_AMBULATORY_CARE_PROVIDER_SITE_OTHER): Payer: Medicare Other | Admitting: Family Medicine

## 2011-05-23 VITALS — BP 142/72 | HR 84 | Resp 18 | Ht 66.25 in | Wt 133.1 lb

## 2011-05-23 DIAGNOSIS — I10 Essential (primary) hypertension: Secondary | ICD-10-CM

## 2011-05-23 DIAGNOSIS — J449 Chronic obstructive pulmonary disease, unspecified: Secondary | ICD-10-CM

## 2011-05-23 DIAGNOSIS — R600 Localized edema: Secondary | ICD-10-CM

## 2011-05-23 DIAGNOSIS — R609 Edema, unspecified: Secondary | ICD-10-CM

## 2011-05-23 DIAGNOSIS — F17200 Nicotine dependence, unspecified, uncomplicated: Secondary | ICD-10-CM

## 2011-05-23 DIAGNOSIS — Z720 Tobacco use: Secondary | ICD-10-CM

## 2011-05-23 MED ORDER — PREDNISONE 10 MG PO TABS: ORAL_TABLET | ORAL | Status: DC

## 2011-05-23 MED ORDER — DOXYCYCLINE HYCLATE 100 MG PO TABS: 100.0000 mg | ORAL_TABLET | Freq: Two times a day (BID) | ORAL | Status: AC

## 2011-05-23 NOTE — Telephone Encounter (Signed)
Pt in for ov 5/8

## 2011-05-23 NOTE — Telephone Encounter (Signed)
Pt seen in office

## 2011-05-23 NOTE — Patient Instructions (Addendum)
Take the symbicort twice a day  Take the antibiotics- Doxycyline twice a day for the lungs and sinuses Try the mucinex over the counter for the congestion  Use the nebulizer if your breathing gets bad  Try warm tea, salt water gargles or cholroseptic spray for your throat Keep previous appt June 4th

## 2011-05-24 ENCOUNTER — Encounter: Payer: Self-pay | Admitting: Family Medicine

## 2011-05-24 NOTE — Assessment & Plan Note (Signed)
Will treat for early COPD exacerbation with antibiotics and low dose prednisone.  Encouraged tobacco cessation, he still smokes occ

## 2011-05-24 NOTE — Assessment & Plan Note (Signed)
Resolved, no longer requires lasix

## 2011-05-24 NOTE — Assessment & Plan Note (Signed)
Continue norvasc 

## 2011-05-24 NOTE — Progress Notes (Signed)
  Subjective:    Patient ID: [name], male    DOB: 04-23-1940, 71 y.o.   MRN: [id number]  HPI Cough with congestion and rattling in chest for the past few days. He also has some sinus pressure and congestion. He continues to go out to work in the yard without difficulty. He's not experiencing any wheezing at bedtime and has been using his Symbicort only once a day as he was confused with the Spiriva. His home health nurse called in stating she had some concerns about his coughing congestion. He was recently admitted to [facility] and intubated secondary to severe COPD exacerbation. He states his nerves are good and he rarely takes the Xanax. His leg swelling has resolved and he no longer uses the Lasix.   Review of Systems  GEN- denies fatigue, fever, weight loss,weakness, recent illness HEENT- denies eye drainage, change in vision,+ nasal discharge, +sore throat CVS- denies chest pain, palpitations RESP- denies SOB,+ cough,+ wheeze ABD- denies N/V, change in stools, abd pain MSK- denies joint pain, muscle aches, injury Neuro- denies headache, dizziness, syncope, seizure activity       Objective:   Physical Exam GEN- NAD, alert and oriented x3 HEENT- PERRL, EOMI, non injected sclera, pink conjunctiva, MMM, oropharynx clear, no maxillary sinus pressure, TM Clear bilat Neck- Supple, no LAD CVS- RRR, no murmur RESP-few scattered wheeze, good air movement, normal WOB ABD-NABS,soft, NT,ND EXT- No edema Pulses- Radial, DP- 2+        Assessment & Plan:

## 2011-05-24 NOTE — Assessment & Plan Note (Signed)
He does smoke occ, everyone in his home smokes which makes this difficult for him

## 2011-06-03 ENCOUNTER — Other Ambulatory Visit: Payer: Self-pay | Admitting: Family Medicine

## 2011-06-04 ENCOUNTER — Other Ambulatory Visit: Payer: Self-pay | Admitting: Family Medicine

## 2011-06-19 ENCOUNTER — Ambulatory Visit (INDEPENDENT_AMBULATORY_CARE_PROVIDER_SITE_OTHER): Payer: Medicare Other | Admitting: Family Medicine

## 2011-06-19 ENCOUNTER — Encounter: Payer: Self-pay | Admitting: Family Medicine

## 2011-06-19 VITALS — BP 130/70 | HR 69 | Resp 18 | Ht 66.25 in | Wt 134.1 lb

## 2011-06-19 DIAGNOSIS — F419 Anxiety disorder, unspecified: Secondary | ICD-10-CM

## 2011-06-19 DIAGNOSIS — Z720 Tobacco use: Secondary | ICD-10-CM

## 2011-06-19 DIAGNOSIS — Z23 Encounter for immunization: Secondary | ICD-10-CM

## 2011-06-19 DIAGNOSIS — I10 Essential (primary) hypertension: Secondary | ICD-10-CM

## 2011-06-19 DIAGNOSIS — F411 Generalized anxiety disorder: Secondary | ICD-10-CM

## 2011-06-19 DIAGNOSIS — E785 Hyperlipidemia, unspecified: Secondary | ICD-10-CM

## 2011-06-19 DIAGNOSIS — F17200 Nicotine dependence, unspecified, uncomplicated: Secondary | ICD-10-CM

## 2011-06-19 DIAGNOSIS — J449 Chronic obstructive pulmonary disease, unspecified: Secondary | ICD-10-CM

## 2011-06-19 MED ORDER — ALPRAZOLAM 0.25 MG PO TABS: 0.2500 mg | ORAL_TABLET | Freq: Three times a day (TID) | ORAL | Status: DC | PRN

## 2011-06-19 NOTE — Patient Instructions (Signed)
Continue your current medications Pneumonia shot given today F/U 3 months

## 2011-06-20 ENCOUNTER — Encounter: Payer: Self-pay | Admitting: Family Medicine

## 2011-06-20 NOTE — Assessment & Plan Note (Signed)
Repeat lipid profile next visit

## 2011-06-20 NOTE — Assessment & Plan Note (Signed)
He does not want to change or add any long acting meds, uses sparingly, will continue

## 2011-06-20 NOTE — Assessment & Plan Note (Signed)
Well controlled, no change to meds 

## 2011-06-20 NOTE — Assessment & Plan Note (Signed)
Currently stable, no change to inhalers, spriva, symbicort, albuterol as needed

## 2011-06-20 NOTE — Assessment & Plan Note (Signed)
Counseled on cessation importance

## 2011-06-20 NOTE — Progress Notes (Signed)
  Subjective:    Patient ID: [name], male    DOB: 28-Nov-1940, 71 y.o.   MRN: [id number]  HPI  Pt here to f/u chronic medical problems, medications and history reviewed No concerns Breathing has been doing good, he is back to smoking 1ppd. Uses xanax as needed feels this keeps him calm. Declines colonoscopy Due for pneumovax  Review of Systems   GEN- denies fatigue, fever, weight loss,weakness, recent illness HEENT- denies eye drainage, change in vision, nasal discharge, CVS- denies chest pain, palpitations RESP- denies SOB, cough, wheeze ABD- denies N/V, change in stools, abd pain GU- denies dysuria, hematuria, dribbling, incontinence MSK- denies joint pain, muscle aches, injury Neuro- denies headache, dizziness, syncope, seizure activity      Objective:   Physical Exam GEN- NAD, alert and oriented x3 HEENT- PERRL, EOMI, non injected sclera, pink conjunctiva, MMM, oropharynx clear Neck- Supple,  CVS- RRR, no murmur RESP-no wheeze, normal WOB, no retractions,no rales, no rhonchi ABD-NABS,soft, NT,ND EXT- no edema Pulses- Radial, DP- 2+ Psych- normal affect and Mood     Assessment & Plan:

## 2011-06-25 ENCOUNTER — Other Ambulatory Visit: Payer: Self-pay | Admitting: Family Medicine

## 2011-07-02 ENCOUNTER — Other Ambulatory Visit: Payer: Self-pay | Admitting: Family Medicine

## 2011-07-07 ENCOUNTER — Other Ambulatory Visit: Payer: Self-pay | Admitting: Family Medicine

## 2011-08-06 ENCOUNTER — Other Ambulatory Visit: Payer: Self-pay | Admitting: Family Medicine

## 2011-08-16 ENCOUNTER — Ambulatory Visit (INDEPENDENT_AMBULATORY_CARE_PROVIDER_SITE_OTHER): Payer: Medicare Other | Admitting: Family Medicine

## 2011-08-16 ENCOUNTER — Encounter: Payer: Self-pay | Admitting: Family Medicine

## 2011-08-16 VITALS — BP 130/80 | HR 72 | Resp 18 | Ht 66.25 in | Wt 138.0 lb

## 2011-08-16 DIAGNOSIS — F419 Anxiety disorder, unspecified: Secondary | ICD-10-CM

## 2011-08-16 DIAGNOSIS — F17200 Nicotine dependence, unspecified, uncomplicated: Secondary | ICD-10-CM

## 2011-08-16 DIAGNOSIS — Z720 Tobacco use: Secondary | ICD-10-CM

## 2011-08-16 DIAGNOSIS — J441 Chronic obstructive pulmonary disease with (acute) exacerbation: Secondary | ICD-10-CM

## 2011-08-16 DIAGNOSIS — F411 Generalized anxiety disorder: Secondary | ICD-10-CM

## 2011-08-16 DIAGNOSIS — J329 Chronic sinusitis, unspecified: Secondary | ICD-10-CM

## 2011-08-16 MED ORDER — PREDNISONE 10 MG PO TABS: ORAL_TABLET | ORAL | Status: DC

## 2011-08-16 MED ORDER — BUPROPION HCL ER (SR) 150 MG PO TB12: 150.0000 mg | ORAL_TABLET | Freq: Two times a day (BID) | ORAL | Status: DC

## 2011-08-16 MED ORDER — DOXYCYCLINE HYCLATE 100 MG PO TABS: 100.0000 mg | ORAL_TABLET | Freq: Two times a day (BID) | ORAL | Status: AC

## 2011-08-16 NOTE — Patient Instructions (Signed)
Treating for sinus and COPD Take the antibiotics and steroids Start the Wellbutrin 1 tablet daily for 1 week, then increase 1 tablet twice a day for smoking  Keep f/u appointment

## 2011-08-17 ENCOUNTER — Encounter: Payer: Self-pay | Admitting: Family Medicine

## 2011-08-17 DIAGNOSIS — J441 Chronic obstructive pulmonary disease with (acute) exacerbation: Secondary | ICD-10-CM | POA: Insufficient documentation

## 2011-08-17 DIAGNOSIS — J329 Chronic sinusitis, unspecified: Secondary | ICD-10-CM | POA: Insufficient documentation

## 2011-08-17 NOTE — Assessment & Plan Note (Signed)
Stable no change to meds 

## 2011-08-17 NOTE — Progress Notes (Signed)
  Subjective:    Patient ID: [name], male    DOB: 08-22-1940, 71 y.o.   MRN: [id number]  HPI  Patient presents with productive cough and sinus drainage for the past week. He is positive sick contacts of both his wife and a family friend who live with him. The family was actually admitted to the hospital recently. He's been using his albuterol inhaler every 4 hours he is also using Spiriva but only using Symbicort once a day due to misreading instructions. He's also had sinus pressure and headache. He denies fever, nausea vomiting, diarrhea. He is back to smoking one pack per day but would like help quitting.  He states his nerves are good right now he is using the Xanax only as needed he does not one any further medication and feels well otherwise.  Review of Systems - per above   GEN- denies fatigue, fever, weight loss,weakness, recent illness HEENT- denies eye drainage, change in vision, nasal discharge, CVS- denies chest pain, palpitations RESP- +SOB, +cough, +wheeze ABD- denies N/V, change in stools, abd pain GU- denies dysuria, hematuria, dribbling, incontinence MSK- denies joint pain, muscle aches, injury Neuro- denies headache, dizziness, syncope, seizure activity      Objective:   Physical Exam GEN- NAD, alert and oriented x3 HEENT- PERRL, EOMI, non injected sclera, pink conjunctiva, MMM, oropharynx clear, sinus TTP, TM clear bilat , nares clear rhinorrhea Neck- Supple,  CVS- RRR, no murmur RESP-few scattered wheeze, normal WOB, no retractions,no rales, + mild rhonchi right base ABD-NABS,soft, NT,ND EXT- no edema Pulses- Radial, DP- 2+ Psych- normal affect and Mood        Assessment & Plan:

## 2011-08-17 NOTE — Assessment & Plan Note (Addendum)
Antibiotics and prednisone, recent admission requiring mechanical ventilation due to COPD Discussed smoking cessation  Symbicort to be used BID

## 2011-08-17 NOTE — Assessment & Plan Note (Signed)
Trial of wellbutrin for smoking , pt wants to quit tobacco , 5 minutes discussing medication and cessation

## 2011-08-31 ENCOUNTER — Other Ambulatory Visit: Payer: Self-pay | Admitting: Family Medicine

## 2011-09-27 ENCOUNTER — Encounter: Payer: Self-pay | Admitting: Family Medicine

## 2011-09-27 ENCOUNTER — Ambulatory Visit (INDEPENDENT_AMBULATORY_CARE_PROVIDER_SITE_OTHER): Payer: Medicare Other | Admitting: Family Medicine

## 2011-09-27 VITALS — BP 142/72 | HR 74 | Resp 18 | Ht 66.25 in | Wt 142.1 lb

## 2011-09-27 DIAGNOSIS — I10 Essential (primary) hypertension: Secondary | ICD-10-CM

## 2011-09-27 DIAGNOSIS — E785 Hyperlipidemia, unspecified: Secondary | ICD-10-CM

## 2011-09-27 DIAGNOSIS — F17200 Nicotine dependence, unspecified, uncomplicated: Secondary | ICD-10-CM

## 2011-09-27 DIAGNOSIS — F411 Generalized anxiety disorder: Secondary | ICD-10-CM

## 2011-09-27 DIAGNOSIS — Z720 Tobacco use: Secondary | ICD-10-CM

## 2011-09-27 DIAGNOSIS — F419 Anxiety disorder, unspecified: Secondary | ICD-10-CM

## 2011-09-27 DIAGNOSIS — J449 Chronic obstructive pulmonary disease, unspecified: Secondary | ICD-10-CM

## 2011-09-27 NOTE — Patient Instructions (Addendum)
Continue your current medications Get the labs done fasting we will call with results Work on the smoking  F/U 4 months

## 2011-09-30 ENCOUNTER — Encounter: Payer: Self-pay | Admitting: Family Medicine

## 2011-09-30 NOTE — Assessment & Plan Note (Signed)
Lipid panel to be done

## 2011-09-30 NOTE — Assessment & Plan Note (Signed)
Currently stable.

## 2011-09-30 NOTE — Assessment & Plan Note (Signed)
He did not try wellbutrin, continues to smoke though states he wants to quit

## 2011-09-30 NOTE — Assessment & Plan Note (Signed)
NO BP meds taken today, previously well controlled, labs to be done

## 2011-09-30 NOTE — Progress Notes (Signed)
  Subjective:    Patient ID: FRANCIS DOENGES, male    DOB: [birth year], 71 y.o.   MRN: [id number]  HPI Pt here to f/u chronic medical problems, no specific concerns, doing well  Nerves are okay, rarely uses xanax  Continues to smoke, did not try wellbutrin  Breathing has been good, no wheezing, no cough out of the ordinary, no SOB  Medications reviewed,     Review of Systems  GEN- denies fatigue, fever, weight loss,weakness, recent illness HEENT- denies eye drainage, change in vision, nasal discharge, CVS- denies chest pain, palpitations RESP- denies SOB, cough, wheeze ABD- denies N/V, change in stools, abd pain GU- denies dysuria, hematuria, dribbling, incontinence MSK- denies joint pain, muscle aches, injury Neuro- denies headache, dizziness, syncope, seizure activity      Objective:   Physical Exam GEN- NAD, alert and oriented x3 HEENT- PERRL, EOMI, non injected sclera, pink conjunctiva, MMM, oropharynx clear Neck- Supple,  CVS- RRR, no murmur RESP-CTAB EXT- No edema Pulses- Radial, DP- 2+ Psych-normal affect and Mood       Assessment & Plan:

## 2011-09-30 NOTE — Assessment & Plan Note (Signed)
Doing well currently, declined Flu shot, no change to meds

## 2011-10-16 ENCOUNTER — Other Ambulatory Visit: Payer: Self-pay | Admitting: Family Medicine

## 2011-10-19 ENCOUNTER — Other Ambulatory Visit: Payer: Self-pay | Admitting: Family Medicine

## 2011-10-26 ENCOUNTER — Other Ambulatory Visit: Payer: Self-pay | Admitting: Family Medicine

## 2011-11-26 ENCOUNTER — Other Ambulatory Visit: Payer: Self-pay | Admitting: Family Medicine

## 2011-12-17 ENCOUNTER — Other Ambulatory Visit: Payer: Self-pay | Admitting: Family Medicine

## 2012-01-14 ENCOUNTER — Other Ambulatory Visit: Payer: Self-pay | Admitting: Family Medicine

## 2012-01-29 ENCOUNTER — Ambulatory Visit: Payer: Medicare Other | Admitting: Family Medicine

## 2012-02-08 ENCOUNTER — Ambulatory Visit: Payer: Medicare Other | Admitting: Family Medicine

## 2012-02-19 ENCOUNTER — Telehealth: Payer: Self-pay | Admitting: Family Medicine

## 2012-02-19 DIAGNOSIS — R5381 Other malaise: Secondary | ICD-10-CM

## 2012-02-19 DIAGNOSIS — I10 Essential (primary) hypertension: Secondary | ICD-10-CM

## 2012-02-19 DIAGNOSIS — J449 Chronic obstructive pulmonary disease, unspecified: Secondary | ICD-10-CM

## 2012-02-19 DIAGNOSIS — E785 Hyperlipidemia, unspecified: Secondary | ICD-10-CM

## 2012-02-20 NOTE — Telephone Encounter (Signed)
Printed and faxed and also sent up front

## 2012-02-21 ENCOUNTER — Ambulatory Visit: Payer: Medicare Other | Admitting: Family Medicine

## 2012-02-22 ENCOUNTER — Ambulatory Visit (INDEPENDENT_AMBULATORY_CARE_PROVIDER_SITE_OTHER): Payer: Medicare Other | Admitting: Family Medicine

## 2012-02-22 ENCOUNTER — Telehealth: Payer: Self-pay | Admitting: Family Medicine

## 2012-02-22 ENCOUNTER — Encounter: Payer: Self-pay | Admitting: Family Medicine

## 2012-02-22 VITALS — BP 138/80 | HR 93 | Resp 16 | Ht 66.5 in | Wt 146.0 lb

## 2012-02-22 DIAGNOSIS — J449 Chronic obstructive pulmonary disease, unspecified: Secondary | ICD-10-CM

## 2012-02-22 DIAGNOSIS — I739 Peripheral vascular disease, unspecified: Secondary | ICD-10-CM

## 2012-02-22 DIAGNOSIS — Z720 Tobacco use: Secondary | ICD-10-CM

## 2012-02-22 DIAGNOSIS — E785 Hyperlipidemia, unspecified: Secondary | ICD-10-CM

## 2012-02-22 DIAGNOSIS — F17200 Nicotine dependence, unspecified, uncomplicated: Secondary | ICD-10-CM

## 2012-02-22 DIAGNOSIS — I10 Essential (primary) hypertension: Secondary | ICD-10-CM

## 2012-02-22 LAB — LIPID PANEL
HDL: 35 mg/dL — ABNORMAL LOW (ref >39)
LDL Cholesterol: 189 mg/dL — ABNORMAL HIGH (ref 0–99)
Total CHOL/HDL Ratio: 6.9 Ratio

## 2012-02-22 LAB — CBC WITH DIFFERENTIAL/PLATELET
Basophils Absolute: 0 10*3/uL (ref 0.0–0.1)
Eosinophils Absolute: 0.2 10*3/uL (ref 0.0–0.7)
Eosinophils Relative: 3 % (ref 0–5)
MCH: 30.8 pg (ref 26.0–34.0)
MCV: 88.8 fL (ref 78.0–100.0)
Platelets: 220 10*3/uL (ref 150–400)
RDW: 15.1 % (ref 11.5–15.5)
WBC: 6.1 10*3/uL (ref 4.0–10.5)

## 2012-02-22 LAB — COMPREHENSIVE METABOLIC PANEL
ALT: 8 U/L (ref 0–53)
Albumin: 4.2 g/dL (ref 3.5–5.2)
Alkaline Phosphatase: 89 U/L (ref 39–117)
CO2: 29 mEq/L (ref 19–32)
Potassium: 4.6 mEq/L (ref 3.5–5.3)
Sodium: 143 mEq/L (ref 135–145)
Total Bilirubin: 0.4 mg/dL (ref 0.3–1.2)
Total Protein: 6.2 g/dL (ref 6.0–8.3)

## 2012-02-22 MED ORDER — ALPRAZOLAM 0.25 MG PO TABS: ORAL_TABLET | ORAL | Status: DC

## 2012-02-22 MED ORDER — ATORVASTATIN CALCIUM 20 MG PO TABS: 20.0000 mg | ORAL_TABLET | Freq: Every day | ORAL | Status: DC

## 2012-02-22 NOTE — Assessment & Plan Note (Signed)
His cholesterol has worsened with an LDL of 189 he does have hypertension as well we'll start him on Lipitor 20 mg

## 2012-02-22 NOTE — Telephone Encounter (Signed)
Refilled

## 2012-02-22 NOTE — Assessment & Plan Note (Signed)
Counseled on cessation now he would like to try one of the they've for cigarettes or the electronic cigarette he did not try the Wellbutrin because of the possible side effects

## 2012-02-22 NOTE — Assessment & Plan Note (Signed)
Blood pressure is well-controlled continue current dose of amlodipine

## 2012-02-22 NOTE — Assessment & Plan Note (Signed)
Unfortunately he continues to smoke but is currently stable. He will continue Spiriva as Symbicort and his rescue inhaler

## 2012-02-22 NOTE — Progress Notes (Signed)
  Subjective:    Patient ID: [name], male    DOB: 07-20-1940, 72 y.o.   MRN: [id number]  HPI  Patient here to follow up chronic medical problems. He has no specific concerns he declines flu shot. He's been using his inhalers as prescribed and his breathing has been stable he states it does act up some time with the very cold weather. He rarely uses his Xanax. He had fasting labs done  Review of Systems  GEN- denies fatigue, fever, weight loss,weakness, recent illness HEENT- denies eye drainage, change in vision, nasal discharge, CVS- denies chest pain, palpitations RESP- denies SOB, cough, wheeze ABD- denies N/V, change in stools, abd pain GU- denies dysuria, hematuria, dribbling, incontinence MSK- denies joint pain, muscle aches, injury Neuro- denies headache, dizziness, syncope, seizure activity      Objective:   Physical Exam  GEN- NAD, alert and oriented x3 HEENT- PERRL, EOMI, non injected sclera, pink conjunctiva, MMM, oropharynx clear Neck- Supple, CVS- RRR, no murmur RESP-few scattered wheeze, no rales, normal WOB ABD-NABS,soft,NT,ND EXT- No edema Pulses- Radial, DP- 2+ Psych- normal affect and mood       Assessment & Plan:

## 2012-02-22 NOTE — Assessment & Plan Note (Signed)
Firstly he has not had any symptoms of claudication he does continue to smoke and his lipids have worsened start treatment per above

## 2012-02-22 NOTE — Patient Instructions (Signed)
Start the cholesterol medicine lipitor at bedtime Continue all other medications Try the vapor cigarrette Start aspirin 81mg  You need to work on the smoking! F/U 4 months

## 2012-03-11 ENCOUNTER — Telehealth: Payer: Self-pay | Admitting: Family Medicine

## 2012-03-11 NOTE — Telephone Encounter (Signed)
Unable to get through to family. He does not qualify for an electric wheelchair. Please see why he is requesting a bedside commode as well. Unless something has changed where he is unable to walk and he needs to come in to the office to evaluated in for hospital followup

## 2012-03-13 ENCOUNTER — Telehealth: Payer: Self-pay | Admitting: Family Medicine

## 2012-03-14 ENCOUNTER — Telehealth: Payer: Self-pay | Admitting: Family Medicine

## 2012-03-14 DIAGNOSIS — R531 Weakness: Secondary | ICD-10-CM | POA: Insufficient documentation

## 2012-03-14 NOTE — Telephone Encounter (Signed)
I spoke with patient's wife. He was discharged home with 24-hour oxygen he has been weak and has not been able to move around as quickly as previous. The bathroom is on the other end of the house and he gets short of breath walking over toward. Asking for a bedside commode for this reason. He also asked for an electric wheelchair so that he can ride around outside. Advised him at this point he is off-electric wheelchair I would give him a small regular wheelchair and will followup with him in the office from there. This is hopefully just an acute decompensation.

## 2012-03-14 NOTE — Telephone Encounter (Signed)
Concern addressed by physician.

## 2012-03-14 NOTE — Telephone Encounter (Signed)
Wife would like for rx to be held until she finds DME that will accept the patient's insurance.

## 2012-03-14 NOTE — Telephone Encounter (Signed)
Concern addressed by physician.  

## 2012-03-24 ENCOUNTER — Other Ambulatory Visit (HOSPITAL_COMMUNITY): Payer: Self-pay

## 2012-03-28 ENCOUNTER — Encounter: Payer: Self-pay | Admitting: Family Medicine

## 2012-03-28 ENCOUNTER — Ambulatory Visit (INDEPENDENT_AMBULATORY_CARE_PROVIDER_SITE_OTHER): Payer: Medicare Other | Admitting: Family Medicine

## 2012-03-28 VITALS — BP 120/74 | HR 79 | Resp 16 | Wt 142.0 lb

## 2012-03-28 DIAGNOSIS — G4700 Insomnia, unspecified: Secondary | ICD-10-CM

## 2012-03-28 DIAGNOSIS — F411 Generalized anxiety disorder: Secondary | ICD-10-CM

## 2012-03-28 DIAGNOSIS — J449 Chronic obstructive pulmonary disease, unspecified: Secondary | ICD-10-CM

## 2012-03-28 DIAGNOSIS — Z720 Tobacco use: Secondary | ICD-10-CM

## 2012-03-28 DIAGNOSIS — F419 Anxiety disorder, unspecified: Secondary | ICD-10-CM

## 2012-03-28 DIAGNOSIS — F17200 Nicotine dependence, unspecified, uncomplicated: Secondary | ICD-10-CM

## 2012-03-28 DIAGNOSIS — I10 Essential (primary) hypertension: Secondary | ICD-10-CM

## 2012-03-28 NOTE — Patient Instructions (Signed)
New oxygen tank -Helios Continue ibuprofen Try to take at bedtime We will check on wheelchair again I will get records F/U 3 months

## 2012-03-28 NOTE — Progress Notes (Signed)
  Subjective:    Patient ID: [name], male    DOB: May 05, 1940, 72 y.o.   MRN: [id number]  HPI  Patient presents for hospital followup he was admitted to [facility] secondary to COPD exacerbation along with pneumonia and positive MRSA infection. He's now on home oxygen. He did have a followup with pulmonology Dr. Juanetta Gosling there were no changes to the medications. He would like to get a [employer]ios oxygen tank his portable tank is too heavy for him to carry. Does not smoke in the past 5 week Does have some difficulty sleeping at bedtime however does not have difficulty during the day. He does use ibuprofen as needed for joint pains and this helps him relax and he is also been taking his Xanax as prescribed. He recalls having peeling skin on his scrotal when he came from the hospital however this resolved a couple days later  Review of Systems  GEN- denies fatigue, fever, weight loss,weakness, recent illness HEENT- denies eye drainage, change in vision, nasal discharge, CVS- denies chest pain, palpitations RESP- denies SOB, cough, wheeze ABD- denies N/V, change in stools, abd pain GU- denies dysuria, hematuria, dribbling, incontinence MSK- denies joint pain, muscle aches, injury Neuro- denies headache, dizziness, syncope, seizure activity      Objective:   Physical Exam GEN- NAD, alert and oriented x3 HEENT- PERRL, EOMI, non injected sclera, pink conjunctiva, MMM, oropharynx clear Neck- Supple, no thryomegaly CVS- RRR, no murmur RESP-CTAB EXT- No edema Pulses- Radial, DP- 2+        Assessment & Plan:

## 2012-03-29 DIAGNOSIS — G4700 Insomnia, unspecified: Secondary | ICD-10-CM | POA: Insufficient documentation

## 2012-03-29 NOTE — Assessment & Plan Note (Signed)
No tobacco past 5 weeks, reiterated importance of cessation, often he cries when discussing his tobacco use > 50 years

## 2012-03-29 NOTE — Assessment & Plan Note (Signed)
Now oxygen dependent since last hospitalization, record to be obtained, reviewed pulmonary note Will order helios tank, which he will be able to move around with

## 2012-03-29 NOTE — Assessment & Plan Note (Signed)
Declines additional meds

## 2012-03-29 NOTE — Assessment & Plan Note (Signed)
Well controlled 

## 2012-03-29 NOTE — Assessment & Plan Note (Signed)
I think  There is a lot of underlying anxiety and depressed mood, he declines this states he feels well, but his health has been declining with his COPD 2nd admission within 6 months. He is to use xanax at bedtime to help him relax

## 2012-04-01 ENCOUNTER — Telehealth: Payer: Self-pay | Admitting: Family Medicine

## 2012-04-01 NOTE — Telephone Encounter (Signed)
Call the number for the [employer] and ask them to fax me any order forms Let them know we have a company for the oxygen that is going to supply the helios We will notify them when things come through, they do not need to call repeatedly

## 2012-04-01 NOTE — Telephone Encounter (Signed)
Called and no answer. No option to leave message

## 2012-04-01 NOTE — Telephone Encounter (Signed)
Please call and clarify theses messages His oxygen was suppose to be to [facility] Please see who called with these messages

## 2012-04-01 NOTE — Telephone Encounter (Signed)
[name] said [employer] bought out the tank and it was huge. Wants to get it from [facility] because it is only 4 lbs there. I told her we would call on of the companies and have a form faxed to be filled out regarding the electric chair.   She called back again and said Val wants to speak to you regarding the wheelchair.

## 2012-04-01 NOTE — Telephone Encounter (Signed)
Spoke with patient husband's will not cover his oxygen secondary to his insurance. His wife wants to go by their to check insurance to see if they can get it covered anyway. Advised I will submit form for the wheelchair but he may not qualify and will need to pay a copay

## 2012-04-02 NOTE — Telephone Encounter (Signed)
fyi- Called both [facility] and [employer] and neither has Acupuncturist. [employer] states they just monitor cardiac patients and [facility] does medical supplies but no Software engineer.

## 2012-04-07 NOTE — Telephone Encounter (Signed)
Please call the family and  Let them know  Please see if [facility] or [facility] can get wheelchair and have them fax the paperwork

## 2012-04-08 ENCOUNTER — Ambulatory Visit (HOSPITAL_COMMUNITY): Payer: Medicare Other

## 2012-04-08 ENCOUNTER — Ambulatory Visit (HOSPITAL_COMMUNITY): Admission: RE | Admit: 2012-04-08 | Payer: Medicare Other | Source: Ambulatory Visit

## 2012-04-15 ENCOUNTER — Other Ambulatory Visit: Payer: Self-pay | Admitting: Family Medicine

## 2012-04-22 ENCOUNTER — Telehealth: Payer: Self-pay | Admitting: Family Medicine

## 2012-04-22 NOTE — Telephone Encounter (Signed)
Patient aware and will schedule appt. They do take his insurance

## 2012-04-22 NOTE — Telephone Encounter (Signed)
Called patient no answer Will tell him to contact [employer] to have them fax over a form

## 2012-04-22 NOTE — Telephone Encounter (Signed)
Will send up front for appt

## 2012-04-23 ENCOUNTER — Ambulatory Visit (HOSPITAL_COMMUNITY)
Admission: RE | Admit: 2012-04-23 | Discharge: 2012-04-23 | Disposition: A | Discharge status: Home / Self Care | Payer: Medicare HMO | Source: Ambulatory Visit | Attending: Pulmonary Disease | Admitting: Pulmonary Disease

## 2012-04-23 DIAGNOSIS — J449 Chronic obstructive pulmonary disease, unspecified: Secondary | ICD-10-CM | POA: Insufficient documentation

## 2012-04-23 DIAGNOSIS — J4489 Other specified chronic obstructive pulmonary disease: Secondary | ICD-10-CM | POA: Insufficient documentation

## 2012-04-23 DIAGNOSIS — R0989 Other specified symptoms and signs involving the circulatory and respiratory systems: Secondary | ICD-10-CM | POA: Insufficient documentation

## 2012-04-23 DIAGNOSIS — R0609 Other forms of dyspnea: Secondary | ICD-10-CM | POA: Insufficient documentation

## 2012-04-23 LAB — BLOOD GAS, ARTERIAL
Bicarbonate: 24.7 mEq/L — ABNORMAL HIGH (ref 20.0–24.0)
TCO2: 21.5 mmol/L (ref 0–100)
pCO2 arterial: 37.1 mmHg (ref 35.0–45.0)
pH, Arterial: 7.438 (ref 7.350–7.450)

## 2012-04-23 MED ORDER — ALBUTEROL SULFATE (5 MG/ML) 0.5% IN NEBU
2.5000 mg | INHALATION_SOLUTION | Freq: Once | RESPIRATORY_TRACT | Status: AC
  Administered 2012-04-23: 2.5 mg via RESPIRATORY_TRACT

## 2012-04-24 ENCOUNTER — Encounter: Payer: Self-pay | Admitting: Family Medicine

## 2012-04-24 ENCOUNTER — Ambulatory Visit (INDEPENDENT_AMBULATORY_CARE_PROVIDER_SITE_OTHER): Payer: Medicare HMO | Admitting: Family Medicine

## 2012-04-24 VITALS — BP 134/76 | HR 100 | Resp 18 | Ht 66.5 in | Wt 139.1 lb

## 2012-04-24 DIAGNOSIS — M1990 Unspecified osteoarthritis, unspecified site: Secondary | ICD-10-CM

## 2012-04-24 DIAGNOSIS — J449 Chronic obstructive pulmonary disease, unspecified: Secondary | ICD-10-CM

## 2012-04-24 DIAGNOSIS — Z7409 Other reduced mobility: Secondary | ICD-10-CM

## 2012-04-24 DIAGNOSIS — R69 Illness, unspecified: Secondary | ICD-10-CM

## 2012-04-24 NOTE — Patient Instructions (Addendum)
Continue current medications I will send over the [facility] application and your records Keep previous follow up

## 2012-04-25 ENCOUNTER — Encounter: Payer: Self-pay | Admitting: Family Medicine

## 2012-04-25 DIAGNOSIS — M1990 Unspecified osteoarthritis, unspecified site: Secondary | ICD-10-CM | POA: Insufficient documentation

## 2012-04-25 DIAGNOSIS — Z7409 Other reduced mobility: Secondary | ICD-10-CM | POA: Insufficient documentation

## 2012-04-25 NOTE — Assessment & Plan Note (Addendum)
Known in Hips and knees, no current meds, uses OTC tylenol as needed Would benefit from power wheelchair to help with mobility

## 2012-04-25 NOTE — Procedures (Signed)
[name], [name] NO.:  [id number]  MEDICAL RECORD NO.:  [id number]  LOCATION:                                 FACILITY:  PHYSICIAN:  Kanyah Matsushima L. Juanetta Gosling, M.D.DATE OF BIRTH:  [birth year]  DATE OF PROCEDURE:  04/24/2012 DATE OF DISCHARGE:                           PULMONARY FUNCTION TEST   ADDENDUM:  There is no significant bronchodilator improvement.     Argus Caraher L. Juanetta Gosling, M.D.     ELH/MEDQ  D:  04/24/2012  T:  04/25/2012  Job:  [id number]

## 2012-04-25 NOTE — Assessment & Plan Note (Signed)
[facility] application completed

## 2012-04-25 NOTE — Procedures (Signed)
NAMESTEWART, SASAKI NO.:  [id number]  MEDICAL RECORD NO.:  [id number]  LOCATION:                                 FACILITY:  PHYSICIAN:  Coila Wardell L. Juanetta Gosling, M.D.DATE OF BIRTH:  1940-02-17  DATE OF PROCEDURE:  04/24/2012 DATE OF DISCHARGE:                           PULMONARY FUNCTION TEST   Reason for pulmonary function testing is COPD; 1. Spirometry shows a severe ventilatory defect with evidence of     airflow obstruction. 2. Lung volumes show air trapping. 3. DLCO is severely reduced. 4. Airway resistance is high conforming the presence of airflow     obstruction. 5. Arterial blood gases normal. 6. This study is consistent with the clinical diagnosis of COPD.     Shayden Gingrich L. Juanetta Gosling, M.D.     ELH/MEDQ  D:  04/24/2012  T:  04/25/2012  Job:  [id number]

## 2012-04-25 NOTE — Assessment & Plan Note (Addendum)
His  COPD has been contributing to his difficulty with ambulation and moving around the house. He would benefit from a motorized chair to assist her with this activities of daily living. He will continues of oxygen and followup with pulmonologist. He currently lives on a one floor home

## 2012-04-25 NOTE — Progress Notes (Addendum)
  Subjective:    Patient ID: [name], male    DOB: 01-03-1941, 72 y.o.   MRN: [id number]  HPI [facility] questions 3-10. Patient here for however around examination. He needs help getting around his home as well as outside of the home he is requesting a wheelchair.  He has difficulty walking short distances in the home due to shortness of breath (COPD) and at times he has flares of his arthritis in his knees/Hips. This would help him get to the bathroom quickly for toileting and into the kitchen area when he is short of breath. He is using oxygen 2 L most of the day.  He is unable to use a cane or walker to meet his mobility needs secondary to her current use of oxygen and desaturations due to his COPD and his oxygen is needed even when he is trying to ambulate  short distances. He has not had any falls in the past 6 months.  A manual wheelchair will be too difficult for him to the new for due to history of osteoarthritis and some decreased range of motions in his upper extremities.  POV Scooter is not covered by his insurance, wheelchair has more ROM to assist in home. He would benefit from a power wheelchair as he is able to sit upright to maneuver the wheelchair and will have improved access to his home.   He has a history of anxiety but is able to mentally operate a power wheelchair. He is motivated and willing to use a power wheelchair.  Review of Systems  GEN- denies fatigue, fever, weight loss,weakness, recent illness HEENT- denies eye drainage, change in vision, nasal discharge, CVS- denies chest pain, palpitations RESP- + SOB, cough, wheeze ABD- denies N/V, change in stools, abd pain GU- denies dysuria, hematuria, dribbling, incontinence MSK- + joint pain, muscle aches, injury Neuro- denies headache, dizziness, syncope, seizure activity      Objective:   Physical Exam GEN- NAD, alert and oriented x3 HEENT- PERRL, EOMI, non injected sclera, pink conjunctiva, MMM,  oropharynx clear Neck- Supple, decreased ROM CVS- RRR, no murmur RESP-CTAB, no wheeze, no rhonchi , 2 L oxygen  EXT- No edema Neuro- CNII-XII in tact, no focal deficits,slow but steady gait  RUE 4/5 LUE 4+/5   RLE/LLE- 4+/5, DTR symmetric, good tone, grip decreased right hand compared to left  fair posture, able to sit up right MSK- normal inspections, Knees, shoulders, no joint effusions, Fair ROM bilateral knees, Hips  Pulses- Radial, DP- 2+        Assessment & Plan:    Power wheelchair   He has difficulty getting to bathroom and kitchen \ he gets SOB with minimal exertion and walking distances. Wheelchair will be used to help Primarly with ADL's and IDL's in home . He is physically and mentally able to use wheelchair as per above

## 2012-04-28 ENCOUNTER — Telehealth: Payer: Self-pay | Admitting: Family Medicine

## 2012-04-28 NOTE — Telephone Encounter (Signed)
Patient aware they were faxed

## 2012-04-29 ENCOUNTER — Telehealth: Payer: Self-pay | Admitting: Family Medicine

## 2012-04-29 NOTE — Telephone Encounter (Signed)
Order sent to [facility] by nurse

## 2012-04-29 NOTE — Telephone Encounter (Signed)
They are calling back now stating that [facility] don't take their insurance like I told them. Please sent oxygen order to [facility].

## 2012-04-29 NOTE — Telephone Encounter (Signed)
Paperwork and last year of office notes faxed back to [employer]

## 2012-05-01 ENCOUNTER — Telehealth: Payer: Self-pay | Admitting: Family Medicine

## 2012-05-01 NOTE — Telephone Encounter (Signed)
[name] stated she faxed to [facility] yesterday wife is aware

## 2012-05-05 ENCOUNTER — Telehealth: Payer: Self-pay | Admitting: Family Medicine

## 2012-05-13 ENCOUNTER — Encounter: Payer: Self-pay | Admitting: *Deleted

## 2012-05-13 NOTE — Telephone Encounter (Signed)
Will await further correspondence from [facility]

## 2012-05-15 ENCOUNTER — Telehealth: Payer: Self-pay | Admitting: Family Medicine

## 2012-05-15 NOTE — Telephone Encounter (Signed)
They do not have a contract with is insurance to do a scooter. Needs power wheelchair. They are faxing back the forms that they need completed. The notes also states that he has troble walking more than 2 blocks - which is not considered bad enough to qualify

## 2012-05-15 NOTE — Telephone Encounter (Signed)
I will follow up with [facility]

## 2012-05-16 ENCOUNTER — Other Ambulatory Visit: Payer: Self-pay | Admitting: Family Medicine

## 2012-06-03 ENCOUNTER — Telehealth: Payer: Self-pay | Admitting: Family Medicine

## 2012-06-04 ENCOUNTER — Other Ambulatory Visit: Payer: Self-pay | Admitting: Family Medicine

## 2012-06-06 NOTE — Telephone Encounter (Signed)
Spoke with [employer] rep and requested information faxed back.

## 2012-07-04 ENCOUNTER — Ambulatory Visit: Payer: Medicare Other | Admitting: Family Medicine

## 2012-07-04 ENCOUNTER — Ambulatory Visit (INDEPENDENT_AMBULATORY_CARE_PROVIDER_SITE_OTHER): Payer: Medicare HMO | Admitting: Family Medicine

## 2012-07-04 ENCOUNTER — Encounter: Payer: Self-pay | Admitting: Family Medicine

## 2012-07-04 VITALS — BP 118/58 | HR 78 | Temp 98.6°F | Resp 18 | Ht 66.5 in | Wt 148.0 lb

## 2012-07-04 DIAGNOSIS — F411 Generalized anxiety disorder: Secondary | ICD-10-CM

## 2012-07-04 DIAGNOSIS — J449 Chronic obstructive pulmonary disease, unspecified: Secondary | ICD-10-CM

## 2012-07-04 DIAGNOSIS — R252 Cramp and spasm: Secondary | ICD-10-CM

## 2012-07-04 DIAGNOSIS — I10 Essential (primary) hypertension: Secondary | ICD-10-CM

## 2012-07-04 DIAGNOSIS — F419 Anxiety disorder, unspecified: Secondary | ICD-10-CM

## 2012-07-04 DIAGNOSIS — E785 Hyperlipidemia, unspecified: Secondary | ICD-10-CM

## 2012-07-04 NOTE — Assessment & Plan Note (Signed)
Currently stable.

## 2012-07-04 NOTE — Progress Notes (Signed)
  Subjective:    Patient ID: [name], male    DOB: December 09, 1940, 72 y.o.   MRN: [id number]  HPI  Patient to follow chronic medical problems. He has no specific concerns, states occasionally he gets muscle cramps and he take a teaspoon or muscular. This was occurred before he was changed to Lipitor. Marland Kitchen He is doing well and she is able to get around the house and outside of the home with minimal shortness of breath due to use a wheelchair. He states he has not smoked since his last hospitalization.  His pulmonary note was reviewed for his COPD he was tried [facility] however he did not like the way the medication effect and therefore he is back on Spiriva, Symbicort and albuterol when necessary  Anxiety he denies any new stressors. His wife is always concerned about him but he denies depression. States that he sleeps well. His house is full of many family members which stresses him but he states he is dealing. He rarely takes the alprazolam  Hyperlipidemia- due for fasting labs  Review of Systems  GEN- denies fatigue, fever, weight loss,weakness, recent illness HEENT- denies eye drainage, change in vision, nasal discharge, CVS- denies chest pain, palpitations RESP- denies SOB, cough, wheeze ABD- denies N/V, change in stools, abd pain GU- denies dysuria, hematuria, dribbling, incontinence MSK- denies joint pain, muscle aches, injury Neuro- denies headache, dizziness, syncope, seizure activity      Objective:   Physical Exam  GEN- NAD, alert and oriented x3 HEENT- PERRL, EOMI, non injected sclera, pink conjunctiva, MMM, oropharynx clear Neck- Supple CVS- RRR, no murmur RESP-CTAB, no wheeze, normal WOB, no rhonchi EXT- No edema Pulses- Radial, DP- 2+ Psych- normal affect and mood       Assessment & Plan:

## 2012-07-04 NOTE — Patient Instructions (Signed)
Try tonic water Labs in 2 weeks -come in fasting Congratulations on the smoking F/U 4 months

## 2012-07-04 NOTE — Assessment & Plan Note (Signed)
Well controlled 

## 2012-07-04 NOTE — Assessment & Plan Note (Signed)
Pt states he is doing well, rare use of benzo

## 2012-07-04 NOTE — Assessment & Plan Note (Signed)
Check FLP on lipitor and LFT

## 2012-07-04 NOTE — Assessment & Plan Note (Signed)
He treat symptomatically with mustard. Also by she can drink a small amount of time acquired severe. I will check a CK of his symptoms are going on before the statin drug less likely that this is the cause. For now we will continue the Lipitor Advised him to stay hydrated as she often gets his symptoms after he has been outside for long periods of time

## 2012-07-21 ENCOUNTER — Other Ambulatory Visit: Payer: Self-pay | Admitting: Family Medicine

## 2012-07-21 NOTE — Telephone Encounter (Signed)
He rarely uses this. Would decrease quantity to # 30 / 0 refills

## 2012-07-21 NOTE — Telephone Encounter (Signed)
Ok to refill 

## 2012-07-21 NOTE — Telephone Encounter (Signed)
?  ok to refill.Dr. Jeanice Lim pt

## 2012-08-18 ENCOUNTER — Other Ambulatory Visit: Payer: Self-pay | Admitting: Family Medicine

## 2012-08-18 NOTE — Telephone Encounter (Signed)
Medication refilled per protocol. 

## 2012-08-20 ENCOUNTER — Other Ambulatory Visit: Payer: Self-pay | Admitting: Family Medicine

## 2012-08-22 ENCOUNTER — Telehealth: Payer: Self-pay | Admitting: Family Medicine

## 2012-08-22 MED ORDER — ALPRAZOLAM 0.25 MG PO TABS: ORAL_TABLET | ORAL | Status: DC

## 2012-08-22 NOTE — Telephone Encounter (Signed)
Okay to refill? 

## 2012-08-22 NOTE — Telephone Encounter (Signed)
Ok to refill 

## 2012-08-22 NOTE — Telephone Encounter (Signed)
Med phoned in °

## 2012-08-25 ENCOUNTER — Telehealth: Payer: Self-pay | Admitting: Family Medicine

## 2012-08-25 ENCOUNTER — Other Ambulatory Visit: Payer: Self-pay | Admitting: Family Medicine

## 2012-08-25 MED ORDER — BUDESONIDE-FORMOTEROL FUMARATE 160-4.5 MCG/ACT IN AERO: INHALATION_SPRAY | RESPIRATORY_TRACT | Status: DC

## 2012-08-25 NOTE — Telephone Encounter (Signed)
Meds refilled.

## 2012-09-10 ENCOUNTER — Telehealth: Payer: Self-pay | Admitting: Family Medicine

## 2012-09-10 MED ORDER — BUDESONIDE-FORMOTEROL FUMARATE 160-4.5 MCG/ACT IN AERO: INHALATION_SPRAY | RESPIRATORY_TRACT | Status: DC

## 2012-09-10 NOTE — Telephone Encounter (Signed)
Symbicort 160-4.5 mcg inhaler inhale 2 puffs q12 hours #10.2

## 2012-09-10 NOTE — Telephone Encounter (Signed)
Meds refilled.

## 2012-09-16 ENCOUNTER — Other Ambulatory Visit: Payer: Self-pay | Admitting: Family Medicine

## 2012-09-16 NOTE — Telephone Encounter (Signed)
Meds refilled.

## 2012-10-20 ENCOUNTER — Other Ambulatory Visit: Payer: Self-pay | Admitting: Family Medicine

## 2012-10-20 MED ORDER — ALBUTEROL SULFATE HFA 108 (90 BASE) MCG/ACT IN AERS: INHALATION_SPRAY | RESPIRATORY_TRACT | Status: DC

## 2012-10-20 NOTE — Telephone Encounter (Signed)
Meds refilled.

## 2012-10-20 NOTE — Telephone Encounter (Signed)
Med was ordered on 08/22/12 with 2 refills

## 2012-10-30 ENCOUNTER — Other Ambulatory Visit: Payer: Self-pay | Admitting: Family Medicine

## 2012-10-30 DIAGNOSIS — Z79899 Other long term (current) drug therapy: Secondary | ICD-10-CM

## 2012-10-30 DIAGNOSIS — E785 Hyperlipidemia, unspecified: Secondary | ICD-10-CM

## 2012-10-31 ENCOUNTER — Other Ambulatory Visit: Payer: Self-pay | Admitting: Family Medicine

## 2012-10-31 ENCOUNTER — Telehealth: Payer: Self-pay | Admitting: Family Medicine

## 2012-10-31 DIAGNOSIS — E785 Hyperlipidemia, unspecified: Secondary | ICD-10-CM

## 2012-10-31 DIAGNOSIS — R252 Cramp and spasm: Secondary | ICD-10-CM

## 2012-10-31 NOTE — Telephone Encounter (Signed)
Called [facility] in [place] and they should have the orders

## 2012-10-31 NOTE — Telephone Encounter (Signed)
Patient called in to let us know that he has labs this morning and the lab said that they do not have orders. [facility] in [place] . [facility]

## 2012-11-01 LAB — COMPREHENSIVE METABOLIC PANEL
ALT: 15 U/L (ref 0–53)
AST: 18 U/L (ref 0–37)
Albumin: 4.5 g/dL (ref 3.5–5.2)
BUN: 10 mg/dL (ref 6–23)
CO2: 27 mEq/L (ref 19–32)
Calcium: 9.6 mg/dL (ref 8.4–10.5)
Creat: 1.17 mg/dL (ref 0.50–1.35)
Potassium: 4.5 mEq/L (ref 3.5–5.3)
Total Bilirubin: 0.6 mg/dL (ref 0.3–1.2)

## 2012-11-01 LAB — LIPID PANEL
HDL: 39 mg/dL — ABNORMAL LOW (ref >39)
Total CHOL/HDL Ratio: 4.5 Ratio
VLDL: 29 mg/dL (ref 0–40)

## 2012-11-01 LAB — CBC
HCT: 47.1 % (ref 39.0–52.0)
Hemoglobin: 16.4 g/dL (ref 13.0–17.0)
MCH: 30.5 pg (ref 26.0–34.0)
MCHC: 34.8 g/dL (ref 30.0–36.0)
MCV: 87.5 fL (ref 78.0–100.0)
Platelets: 245 10*3/uL (ref 150–400)
RDW: 15.6 % — ABNORMAL HIGH (ref 11.5–15.5)

## 2012-11-01 LAB — CK: Total CK: 111 U/L (ref 7–232)

## 2012-11-03 ENCOUNTER — Ambulatory Visit (INDEPENDENT_AMBULATORY_CARE_PROVIDER_SITE_OTHER): Payer: Medicare HMO | Admitting: Family Medicine

## 2012-11-03 ENCOUNTER — Encounter: Payer: Self-pay | Admitting: Family Medicine

## 2012-11-03 VITALS — BP 106/80 | HR 98 | Temp 97.8°F | Resp 24 | Wt 148.0 lb

## 2012-11-03 DIAGNOSIS — F419 Anxiety disorder, unspecified: Secondary | ICD-10-CM

## 2012-11-03 DIAGNOSIS — Z720 Tobacco use: Secondary | ICD-10-CM

## 2012-11-03 DIAGNOSIS — F411 Generalized anxiety disorder: Secondary | ICD-10-CM

## 2012-11-03 DIAGNOSIS — H9190 Unspecified hearing loss, unspecified ear: Secondary | ICD-10-CM

## 2012-11-03 DIAGNOSIS — E785 Hyperlipidemia, unspecified: Secondary | ICD-10-CM

## 2012-11-03 DIAGNOSIS — F17200 Nicotine dependence, unspecified, uncomplicated: Secondary | ICD-10-CM

## 2012-11-03 DIAGNOSIS — I10 Essential (primary) hypertension: Secondary | ICD-10-CM

## 2012-11-03 DIAGNOSIS — J449 Chronic obstructive pulmonary disease, unspecified: Secondary | ICD-10-CM

## 2012-11-03 DIAGNOSIS — H9193 Unspecified hearing loss, bilateral: Secondary | ICD-10-CM

## 2012-11-03 MED ORDER — FLUOXETINE HCL 10 MG PO CAPS: 10.0000 mg | ORAL_CAPSULE | Freq: Every day | ORAL | Status: DC

## 2012-11-03 MED ORDER — NITROGLYCERIN 0.4 MG SL SUBL: SUBLINGUAL_TABLET | SUBLINGUAL | Status: DC

## 2012-11-03 NOTE — Assessment & Plan Note (Signed)
Due to age, given information for audiology in town, not sure if his insurance will cover

## 2012-11-03 NOTE — Assessment & Plan Note (Addendum)
His COPD is currently stable. He would continue his oxygen therapy. I have sent a prescription to see we can get him on home O2 sensor Declines flu shot

## 2012-11-03 NOTE — Assessment & Plan Note (Signed)
Start prozac 10mg  daily

## 2012-11-03 NOTE — Assessment & Plan Note (Signed)
FLP much improved on lipitor, LFT normal

## 2012-11-03 NOTE — Patient Instructions (Signed)
Continue current medications Use the oxygen as needed Start prozac once a day for your nerves Your cholesterol looks good  Call [facility]- see about hearing aides  [phone] F/U 2 months

## 2012-11-03 NOTE — Assessment & Plan Note (Signed)
He quit smoking but is now using snuff. Advise against all forms of tobacco

## 2012-11-03 NOTE — Assessment & Plan Note (Signed)
Well controlled 

## 2012-11-03 NOTE — Progress Notes (Signed)
  Subjective:    Patient ID: [name], male    DOB: [birth year], 72 y.o.   MRN: [id number]  HPI   Patient here follow chronic medical problems. He still being followed by pulmonology for his COPD. He uses oxygen as needed and mostly around the house. He has quit smoking however he does chew snuff now. He does get short of breath with minimal activity such as walking to the mailbox or trying to walk around the grocery store he has typically uses oxygen at these times. He still using his inhalers as prescribed. He asked for home oxygen sat  Anxiety-history of anxiety disorder. States he's under a lot of stress he is a lot of bills to pay and he is the only one working in the household. He also has a lot of stress with his wife. They have many people living in the home it is difficult for him to keep up. He also worries about his breathing and his health as his age increases. Last year he was hospitalized a couple of times because of his lungs. He uses his Xanax very sparingly.   Review of Systems   GEN- denies fatigue, fever, weight loss,weakness, recent illness HEENT- denies eye drainage, change in vision, nasal discharge, CVS- denies chest pain, palpitations RESP- denies SOB, cough, wheeze ABD- denies N/V, change in stools, abd pain GU- denies dysuria, hematuria, dribbling, incontinence MSK- denies joint pain, muscle aches, injury Neuro- denies headache, dizziness, syncope, seizure activity      Objective:   Physical Exam GEN- NAD, alert and oriented x3 HEENT- PERRL, EOMI, non injected sclera, pink conjunctiva, MMM, oropharynx clear, no oral lesions, snuff  Neck- Supple, no lad CVS- RRR, no murmur RESP-CTAB EXT- No edema Pulses- Radial, DP- 2+ Psych- normal affect and mood       Assessment & Plan:

## 2012-11-17 ENCOUNTER — Other Ambulatory Visit: Payer: Self-pay | Admitting: Family Medicine

## 2012-11-17 NOTE — Telephone Encounter (Signed)
Medication refilled per protocol. 

## 2012-12-01 ENCOUNTER — Other Ambulatory Visit: Payer: Self-pay | Admitting: Family Medicine

## 2012-12-01 NOTE — Telephone Encounter (Signed)
Meds refilled.

## 2012-12-01 NOTE — Telephone Encounter (Signed)
Okay to refill? 

## 2012-12-01 NOTE — Telephone Encounter (Signed)
?   OK to Refill  

## 2013-02-04 ENCOUNTER — Encounter: Payer: Self-pay | Admitting: Family Medicine

## 2013-02-04 ENCOUNTER — Ambulatory Visit (INDEPENDENT_AMBULATORY_CARE_PROVIDER_SITE_OTHER): Payer: Medicare HMO | Admitting: Family Medicine

## 2013-02-04 VITALS — BP 132/78 | HR 100 | Temp 98.3°F | Resp 17 | Ht 67.0 in | Wt 152.0 lb

## 2013-02-04 DIAGNOSIS — F411 Generalized anxiety disorder: Secondary | ICD-10-CM

## 2013-02-04 DIAGNOSIS — Z23 Encounter for immunization: Secondary | ICD-10-CM

## 2013-02-04 DIAGNOSIS — J449 Chronic obstructive pulmonary disease, unspecified: Secondary | ICD-10-CM

## 2013-02-04 DIAGNOSIS — F419 Anxiety disorder, unspecified: Secondary | ICD-10-CM

## 2013-02-04 DIAGNOSIS — I10 Essential (primary) hypertension: Secondary | ICD-10-CM | POA: Diagnosis not present

## 2013-02-04 NOTE — Assessment & Plan Note (Signed)
Improved with prozac

## 2013-02-04 NOTE — Patient Instructions (Signed)
Continue current meds Decrease the oxygen to 1L at bedtime, use during the day as needed There should absolutely be no smoking in the home or around [name] with his COPD!! Pneumonia Booster F/U 4 months

## 2013-02-04 NOTE — Progress Notes (Signed)
   Subjective:    Patient ID: [name], male    DOB: 1940-11-09, 73 y.o.   MRN: [id number]  HPI Patient here to follow chronic medical problems. He has no specific concerns today. He is doing well and all this medications. He does not use his oxygen throughout the day one to know if he could decrease this to just at bedtime and as needed. He's not been smoking recently. He declines flu shot but is due for Prevnar 13 Medications were reviewed   He states that he is doing well on the Prozac which is being used for his anxiety, he rarely uses the Xanax   Review of Systems  GEN- denies fatigue, fever, weight loss,weakness, recent illness HEENT- denies eye drainage, change in vision, nasal discharge, CVS- denies chest pain, palpitations RESP- denies SOB, cough, wheeze ABD- denies N/V, change in stools, abd pain GU- denies dysuria, hematuria, dribbling, incontinence MSK- denies joint pain, muscle aches, injury Neuro- denies headache, dizziness, syncope, seizure activity       Objective:   Physical Exam GEN- NAD, alert and oriented x3 HEENT- PERRL, EOMI, non injected sclera, pink conjunctiva, MMM, oropharynx clear,  Neck- Supple CVS- RRR, no murmur RESP-CTAB EXT- No edema Pulses- Radial, DP- 2+ Psych- normal affect and mood        Assessment & Plan:

## 2013-02-04 NOTE — Assessment & Plan Note (Signed)
Well controlled, no change to meds 

## 2013-02-04 NOTE — Assessment & Plan Note (Signed)
Doing well, breathing stable Okay to decrease Oxygen to 1L at bedtime and use as needed during the day Continue meds

## 2013-02-10 ENCOUNTER — Telehealth: Payer: Self-pay | Admitting: Family Medicine

## 2013-02-10 NOTE — Telephone Encounter (Signed)
Pt is needing cataract surgery and that is needing authorization to do so. [facility] is the office their phone number is  [phone] Call back number is [phone]

## 2013-02-23 ENCOUNTER — Other Ambulatory Visit: Payer: Self-pay | Admitting: Family Medicine

## 2013-02-24 NOTE — Telephone Encounter (Signed)
Okay to refill? 

## 2013-02-24 NOTE — Telephone Encounter (Signed)
?   Ok to refill, last refill 01/31/13, last ov 02/04/13

## 2013-02-26 NOTE — Telephone Encounter (Signed)
Medication called to pharmacy. 

## 2013-03-04 NOTE — Telephone Encounter (Signed)
Pt I believe had [employer] needed the auth on it.

## 2013-03-04 NOTE — Telephone Encounter (Signed)
[name],  Why was this routed to me?  See me.  Rob

## 2013-03-31 ENCOUNTER — Encounter: Payer: Self-pay | Admitting: Family Medicine

## 2013-03-31 ENCOUNTER — Ambulatory Visit (INDEPENDENT_AMBULATORY_CARE_PROVIDER_SITE_OTHER): Payer: Medicare HMO | Admitting: Family Medicine

## 2013-03-31 VITALS — BP 144/87 | HR 82 | Temp 98.2°F | Resp 22 | Ht 64.0 in | Wt 146.0 lb

## 2013-03-31 DIAGNOSIS — F17200 Nicotine dependence, unspecified, uncomplicated: Secondary | ICD-10-CM

## 2013-03-31 DIAGNOSIS — J189 Pneumonia, unspecified organism: Secondary | ICD-10-CM

## 2013-03-31 DIAGNOSIS — Z720 Tobacco use: Secondary | ICD-10-CM

## 2013-03-31 DIAGNOSIS — E785 Hyperlipidemia, unspecified: Secondary | ICD-10-CM

## 2013-03-31 DIAGNOSIS — J449 Chronic obstructive pulmonary disease, unspecified: Secondary | ICD-10-CM

## 2013-03-31 DIAGNOSIS — I10 Essential (primary) hypertension: Secondary | ICD-10-CM

## 2013-03-31 MED ORDER — LEVOFLOXACIN 500 MG PO TABS: 500.0000 mg | ORAL_TABLET | Freq: Every day | ORAL | Status: DC

## 2013-03-31 MED ORDER — PREDNISONE 10 MG PO TABS: ORAL_TABLET | ORAL | Status: DC

## 2013-03-31 NOTE — Assessment & Plan Note (Signed)
He is followed by pulmonologist we'll consider Daliresp in him

## 2013-03-31 NOTE — Assessment & Plan Note (Signed)
BP looks okay, fasting labs to be done

## 2013-03-31 NOTE — Progress Notes (Signed)
Patient ID: [name], male   DOB: 01-21-1940, 73 y.o.   MRN: [id number]   Subjective:    Patient ID: [name], male    DOB: 1940/12/15, 73 y.o.   MRN: [id number]  Patient presents for hospital F/U  issue to followup hospitalization. He was actually admitted to [facility] secondary to pneumonia and COPD. I was unaware that he was admitted to the hospital and I do not have a discharge summary. He states that none of his medications were changes and that he completed his antibiotics and prednisone. He was in the ICU but did not have to be intubated per report. He states that he is doing well and feels good. He still using his oxygen however he does not have an old right now. He has no specific concerns today.  Note he is chewing tobacco since he has quit smoking    Review Of Systems:  GEN- denies fatigue, fever, weight loss,weakness, recent illness HEENT- denies eye drainage, change in vision, nasal discharge, CVS- denies chest pain, palpitations RESP- denies SOB, cough, wheeze ABD- denies N/V, change in stools, abd pain GU- denies dysuria, hematuria, dribbling, incontinence MSK- denies joint pain, muscle aches, injury Neuro- denies headache, dizziness, syncope, seizure activity       Objective:    BP 144/87  Pulse 82  Temp(Src) 98.2 F (36.8 C)  Resp 22  Ht 5\' 4"  (1.626 m)  Wt 146 lb (66.225 kg)  BMI 25.05 kg/m2  SpO2 92% GEN- NAD, alert and oriented x3 HEENT- PERRL, EOMI, non injected sclera, pink conjunctiva, MMM, oropharynx clear Neck- Supple, no LAD CVS- RRR, no murmur RESP-CTAB, good air movement, no wheezing EXT- No edema Pulses- Radial 2+        Assessment & Plan:      Problem List Items Addressed This Visit   Hyperlipidemia - Primary   Relevant Orders      Lipid panel      Comprehensive metabolic panel   Essential hypertension, benign   Relevant Orders      CBC with Differential      Note: This dictation was prepared with Dragon  dictation along with smaller phrase technology. Any transcriptional errors that result from this process are unintentional.

## 2013-03-31 NOTE — Assessment & Plan Note (Signed)
Treated for community-acquired pneumonia. He has had pneumonia as well as multiple COPD exacerbations. I've given him some Levaquin and prednisone to have at home as he was unable to get to the office when his symptoms initially started. I trust his judgment were to start the medication and he would get him in the office for a quick followup. He will continue his Symbicort Spiriva and albuterol. Of note he does not like to use the nebulizer as he feels makes him worse when he is in acute exacerbation. I will obtain the hospital discharge summary

## 2013-03-31 NOTE — Patient Instructions (Addendum)
Get your labs done fasting Continue your current meds Ensure twice a day  Antibiotics and prednisone at home to use in case of emergency Release of records- [place] labs and discharge summary F/U as scheduled MAY

## 2013-03-31 NOTE — Assessment & Plan Note (Signed)
Discussed need to d/c all tobacco products

## 2013-04-07 ENCOUNTER — Other Ambulatory Visit: Payer: Self-pay | Admitting: *Deleted

## 2013-04-07 MED ORDER — FLUOXETINE HCL 10 MG PO CAPS: 10.0000 mg | ORAL_CAPSULE | Freq: Every day | ORAL | Status: DC

## 2013-04-07 NOTE — Telephone Encounter (Signed)
Refill appropriate and filled per protocol. 

## 2013-04-20 ENCOUNTER — Other Ambulatory Visit: Payer: Self-pay | Admitting: *Deleted

## 2013-04-20 MED ORDER — ATORVASTATIN CALCIUM 20 MG PO TABS: ORAL_TABLET | ORAL | Status: DC

## 2013-04-20 MED ORDER — AMLODIPINE BESYLATE 10 MG PO TABS: ORAL_TABLET | ORAL | Status: DC

## 2013-04-20 MED ORDER — ALBUTEROL SULFATE HFA 108 (90 BASE) MCG/ACT IN AERS
INHALATION_SPRAY | RESPIRATORY_TRACT | Status: DC
Start: 2013-04-20 — End: 2013-12-02

## 2013-04-20 NOTE — Telephone Encounter (Signed)
Refill appropriate and filled per protocol. 

## 2013-06-05 ENCOUNTER — Ambulatory Visit: Payer: Medicare HMO | Admitting: Family Medicine

## 2013-06-09 ENCOUNTER — Other Ambulatory Visit: Payer: Self-pay | Admitting: Family Medicine

## 2013-06-09 NOTE — Telephone Encounter (Signed)
Medication refilled per protocol. 

## 2013-06-17 ENCOUNTER — Telehealth: Payer: Self-pay | Admitting: Family Medicine

## 2013-06-17 MED ORDER — LEVOFLOXACIN 500 MG PO TABS: 500.0000 mg | ORAL_TABLET | Freq: Every day | ORAL | Status: DC

## 2013-06-17 MED ORDER — PREDNISONE 10 MG PO TABS: ORAL_TABLET | ORAL | Status: DC

## 2013-06-17 NOTE — Telephone Encounter (Signed)
Patient's wife here in the office. He's been sick with cough with production and low-grade fever chills the past few days. He has severe COPD and has had multiple [place] secondary to pneumonia. He is unable to come to her appointment because he has an outstanding bill at this time. She requests antibiotic be sent in. His high risk history for admission the hospital I will go ahead and start him on antibiotics and prednisone. Advised to go to the ER if he does not improve

## 2013-07-15 ENCOUNTER — Other Ambulatory Visit: Payer: Self-pay | Admitting: *Deleted

## 2013-07-15 MED ORDER — TIOTROPIUM BROMIDE MONOHYDRATE 18 MCG IN CAPS: ORAL_CAPSULE | RESPIRATORY_TRACT | Status: DC

## 2013-07-15 NOTE — Telephone Encounter (Signed)
Prescription sent to pharmacy.

## 2013-07-21 ENCOUNTER — Telehealth: Payer: Self-pay | Admitting: *Deleted

## 2013-07-21 MED ORDER — ALPRAZOLAM 0.25 MG PO TABS: ORAL_TABLET | ORAL | Status: DC

## 2013-07-21 NOTE — Telephone Encounter (Signed)
Medication called to pharmacy. 

## 2013-07-21 NOTE — Telephone Encounter (Signed)
okay

## 2013-07-21 NOTE — Telephone Encounter (Signed)
Received fax from pharmacy requesting refill on Xanax.   Ok to refill??  Last office visit 03/31/2013.  Last refill 02/26/2013, #2 refills.

## 2013-07-29 ENCOUNTER — Encounter: Payer: Self-pay | Admitting: Family Medicine

## 2013-07-29 ENCOUNTER — Ambulatory Visit (INDEPENDENT_AMBULATORY_CARE_PROVIDER_SITE_OTHER): Payer: Medicare HMO | Admitting: Family Medicine

## 2013-07-29 VITALS — BP 122/68 | HR 98 | Temp 98.6°F | Resp 20 | Ht 64.0 in | Wt 149.0 lb

## 2013-07-29 DIAGNOSIS — L570 Actinic keratosis: Secondary | ICD-10-CM

## 2013-07-29 DIAGNOSIS — I739 Peripheral vascular disease, unspecified: Secondary | ICD-10-CM

## 2013-07-29 DIAGNOSIS — J418 Mixed simple and mucopurulent chronic bronchitis: Secondary | ICD-10-CM

## 2013-07-29 DIAGNOSIS — F411 Generalized anxiety disorder: Secondary | ICD-10-CM

## 2013-07-29 DIAGNOSIS — F419 Anxiety disorder, unspecified: Secondary | ICD-10-CM

## 2013-07-29 DIAGNOSIS — J411 Mucopurulent chronic bronchitis: Secondary | ICD-10-CM

## 2013-07-29 DIAGNOSIS — E785 Hyperlipidemia, unspecified: Secondary | ICD-10-CM

## 2013-07-29 DIAGNOSIS — I10 Essential (primary) hypertension: Secondary | ICD-10-CM

## 2013-07-29 DIAGNOSIS — R0789 Other chest pain: Secondary | ICD-10-CM

## 2013-07-29 MED ORDER — FLUOXETINE HCL 20 MG PO CAPS: 20.0000 mg | ORAL_CAPSULE | Freq: Every day | ORAL | Status: DC

## 2013-07-29 NOTE — Assessment & Plan Note (Addendum)
Increase prozac to 20mg  once a day  Continue xanax

## 2013-07-29 NOTE — Assessment & Plan Note (Signed)
Well controlled 

## 2013-07-29 NOTE — Assessment & Plan Note (Signed)
Check lipids, non fasting

## 2013-07-29 NOTE — Assessment & Plan Note (Signed)
Pt declines treatment at this time, was seen by dermatology in the past, also concerned for possible BCC on nos

## 2013-07-29 NOTE — Assessment & Plan Note (Signed)
Currently stable, f/u pulmonary, continue oxygen, needs to quit smoking

## 2013-07-29 NOTE — Assessment & Plan Note (Signed)
Currently stable, no claudication symptoms

## 2013-07-29 NOTE — Progress Notes (Signed)
Patient ID: [name], male   DOB: 02-Apr-1940, 73 y.o.   MRN: [id number]   Subjective:    Patient ID: [name], male    DOB: 09/20/1940, 73 y.o.   MRN: [id number]  Patient presents for Breathing Issues and Increased Anxiety   patient here follow chronic medical problems. He is history of severe COPD he uses 2 L of oxygen as needed he's taken his inhalers as prescribed. He does continue to smoke and continues to have a cough but not very productive at this time. He also complained that he gets a left-sided chest pain on and off he's had this for the past 6 or 8 months he thinks it is due to his coughing but not sure. He denies any radiating pain no change in his breathing when he gets the chest pain he did not take any nitroglycerin when it occurred. The last episode was a couple months ago.  Anxiety he continues to be under a lot of stress they're 9 adult living in his home and he and his wife are caring for them all. He states that his utility bills are extremely high. He does not feel like he can get any rest or peace in his own home. He's taken the Prozac 10 mg which helps but he still uses the Xanax typically one tablet every other day.  He is due for fasting labs that he never had completed    Review Of Systems:  GEN- denies fatigue, fever, weight loss,weakness, recent illness HEENT- denies eye drainage, change in vision, nasal discharge, CVS- denies chest pain, palpitations RESP- denies SOB, cough, wheeze ABD- denies N/V, change in stools, abd pain GU- denies dysuria, hematuria, dribbling, incontinence MSK- denies joint pain, muscle aches, injury Neuro- denies headache, dizziness, syncope, seizure activity       Objective:    BP 122/68  Pulse 98  Temp(Src) 98.6 F (37 C) (Oral)  Resp 20  Ht 5\' 4"  (1.626 m)  Wt 149 lb (67.586 kg)  BMI 25.56 kg/m2  SpO2 95% GEN- NAD, alert and oriented x3 HEENT- PERRL, EOMI, non injected sclera, pink conjunctiva, MMM, oropharynx  clear Neck- Supple, CVS- RRR, no murmur RESP-CTAB ABD-NABS,soft,NT,ND EXT- No edema Psych- normal affect and mood Skin- AK on forehead and scalp, scaly erythematous lesion on bridge of nose Pulses- Radial, DP- 2+  EKG- NSR, no ST changes       Assessment & Plan:      Problem List Items Addressed This Visit   Anxiety - Primary     Increase to 20mg  once a day     Relevant Medications      FLUoxetine (PROZAC) capsule      Note: This dictation was prepared with Dragon dictation along with smaller phrase technology. Any transcriptional errors that result from this process are unintentional.

## 2013-07-29 NOTE — Patient Instructions (Signed)
Prozac increased to 20mg , you can take 2 of the 10mg  capsules until you run out Continue your inhalers Let me know when you want to go to dermatology Reschedule with the lung doctor F/U 4 months

## 2013-07-30 LAB — CBC WITH DIFFERENTIAL/PLATELET
Basophils Absolute: 0.1 10*3/uL (ref 0.0–0.1)
Basophils Relative: 1 % (ref 0–1)
EOS PCT: 3 % (ref 0–5)
Eosinophils Absolute: 0.2 10*3/uL (ref 0.0–0.7)
HCT: 46.5 % (ref 39.0–52.0)
Hemoglobin: 16.1 g/dL (ref 13.0–17.0)
LYMPHS ABS: 1.9 10*3/uL (ref 0.7–4.0)
LYMPHS PCT: 33 % (ref 12–46)
MCH: 30.6 pg (ref 26.0–34.0)
MCHC: 34.6 g/dL (ref 30.0–36.0)
MCV: 88.4 fL (ref 78.0–100.0)
Monocytes Absolute: 0.8 10*3/uL (ref 0.1–1.0)
Monocytes Relative: 13 % — ABNORMAL HIGH (ref 3–12)
Neutro Abs: 3 10*3/uL (ref 1.7–7.7)
Neutrophils Relative %: 50 % (ref 43–77)
Platelets: 273 10*3/uL (ref 150–400)
RBC: 5.26 MIL/uL (ref 4.22–5.81)
RDW: 15.1 % (ref 11.5–15.5)
WBC: 5.9 10*3/uL (ref 4.0–10.5)

## 2013-07-30 LAB — COMPREHENSIVE METABOLIC PANEL
ALT: 9 U/L (ref 0–53)
AST: 15 U/L (ref 0–37)
Albumin: 4.2 g/dL (ref 3.5–5.2)
Alkaline Phosphatase: 87 U/L (ref 39–117)
BUN: 13 mg/dL (ref 6–23)
CO2: 27 meq/L (ref 19–32)
CREATININE: 1.06 mg/dL (ref 0.50–1.35)
Calcium: 9.2 mg/dL (ref 8.4–10.5)
Chloride: 108 mEq/L (ref 96–112)
Glucose, Bld: 69 mg/dL — ABNORMAL LOW (ref 70–99)
Potassium: 4.9 mEq/L (ref 3.5–5.3)
SODIUM: 144 meq/L (ref 135–145)
TOTAL PROTEIN: 6.5 g/dL (ref 6.0–8.3)
Total Bilirubin: 0.4 mg/dL (ref 0.2–1.2)

## 2013-07-30 LAB — LIPID PANEL
CHOLESTEROL: 185 mg/dL (ref 0–200)
HDL: 33 mg/dL — AB (ref >39)
LDL Cholesterol: 124 mg/dL — ABNORMAL HIGH (ref 0–99)
TRIGLYCERIDES: 138 mg/dL (ref <150)
Total CHOL/HDL Ratio: 5.6 Ratio
VLDL: 28 mg/dL (ref 0–40)

## 2013-07-31 ENCOUNTER — Other Ambulatory Visit: Payer: Self-pay | Admitting: *Deleted

## 2013-07-31 MED ORDER — ATORVASTATIN CALCIUM 40 MG PO TABS: ORAL_TABLET | ORAL | Status: DC

## 2013-08-18 ENCOUNTER — Inpatient Hospital Stay (HOSPITAL_COMMUNITY)
Admission: EM | Admit: 2013-08-18 | Discharge: 2013-08-20 | DRG: 190 | Disposition: A | Discharge status: Home / Self Care | Payer: Medicare HMO | Attending: Family Medicine | Admitting: Family Medicine

## 2013-08-18 ENCOUNTER — Emergency Department (HOSPITAL_COMMUNITY): Payer: Medicare HMO

## 2013-08-18 ENCOUNTER — Encounter (HOSPITAL_COMMUNITY): Payer: Self-pay | Admitting: Emergency Medicine

## 2013-08-18 ENCOUNTER — Inpatient Hospital Stay (HOSPITAL_COMMUNITY): Payer: Medicare HMO

## 2013-08-18 DIAGNOSIS — J438 Other emphysema: Secondary | ICD-10-CM

## 2013-08-18 DIAGNOSIS — Z87891 Personal history of nicotine dependence: Secondary | ICD-10-CM | POA: Diagnosis not present

## 2013-08-18 DIAGNOSIS — Z91199 Patient's noncompliance with other medical treatment and regimen due to unspecified reason: Secondary | ICD-10-CM | POA: Diagnosis not present

## 2013-08-18 DIAGNOSIS — J9602 Acute respiratory failure with hypercapnia: Secondary | ICD-10-CM

## 2013-08-18 DIAGNOSIS — M129 Arthropathy, unspecified: Secondary | ICD-10-CM | POA: Diagnosis present

## 2013-08-18 DIAGNOSIS — I10 Essential (primary) hypertension: Secondary | ICD-10-CM | POA: Diagnosis present

## 2013-08-18 DIAGNOSIS — F411 Generalized anxiety disorder: Secondary | ICD-10-CM | POA: Diagnosis present

## 2013-08-18 DIAGNOSIS — J9621 Acute and chronic respiratory failure with hypoxia: Secondary | ICD-10-CM

## 2013-08-18 DIAGNOSIS — Z79899 Other long term (current) drug therapy: Secondary | ICD-10-CM | POA: Diagnosis not present

## 2013-08-18 DIAGNOSIS — K219 Gastro-esophageal reflux disease without esophagitis: Secondary | ICD-10-CM | POA: Diagnosis present

## 2013-08-18 DIAGNOSIS — R0602 Shortness of breath: Secondary | ICD-10-CM | POA: Diagnosis not present

## 2013-08-18 DIAGNOSIS — I509 Heart failure, unspecified: Secondary | ICD-10-CM | POA: Diagnosis present

## 2013-08-18 DIAGNOSIS — J9620 Acute and chronic respiratory failure, unspecified whether with hypoxia or hypercapnia: Secondary | ICD-10-CM | POA: Diagnosis present

## 2013-08-18 DIAGNOSIS — R0902 Hypoxemia: Secondary | ICD-10-CM

## 2013-08-18 DIAGNOSIS — Z9981 Dependence on supplemental oxygen: Secondary | ICD-10-CM | POA: Diagnosis not present

## 2013-08-18 DIAGNOSIS — J441 Chronic obstructive pulmonary disease with (acute) exacerbation: Principal | ICD-10-CM | POA: Diagnosis present

## 2013-08-18 DIAGNOSIS — R339 Retention of urine, unspecified: Secondary | ICD-10-CM | POA: Diagnosis present

## 2013-08-18 DIAGNOSIS — J449 Chronic obstructive pulmonary disease, unspecified: Secondary | ICD-10-CM | POA: Diagnosis present

## 2013-08-18 DIAGNOSIS — F17201 Nicotine dependence, unspecified, in remission: Secondary | ICD-10-CM

## 2013-08-18 DIAGNOSIS — J9611 Chronic respiratory failure with hypoxia: Secondary | ICD-10-CM

## 2013-08-18 DIAGNOSIS — Z9119 Patient's noncompliance with other medical treatment and regimen: Secondary | ICD-10-CM

## 2013-08-18 DIAGNOSIS — E785 Hyperlipidemia, unspecified: Secondary | ICD-10-CM | POA: Diagnosis present

## 2013-08-18 DIAGNOSIS — I739 Peripheral vascular disease, unspecified: Secondary | ICD-10-CM | POA: Diagnosis present

## 2013-08-18 DIAGNOSIS — J439 Emphysema, unspecified: Secondary | ICD-10-CM

## 2013-08-18 LAB — BASIC METABOLIC PANEL
Anion gap: 12 (ref 5–15)
BUN: 13 mg/dL (ref 6–23)
CHLORIDE: 100 meq/L (ref 96–112)
CO2: 28 meq/L (ref 19–32)
Calcium: 9.1 mg/dL (ref 8.4–10.5)
Creatinine, Ser: 1.07 mg/dL (ref 0.50–1.35)
GFR calc Af Amer: 77 mL/min — ABNORMAL LOW (ref >90)
GFR calc non Af Amer: 67 mL/min — ABNORMAL LOW (ref >90)
Glucose, Bld: 110 mg/dL — ABNORMAL HIGH (ref 70–99)
Potassium: 4.3 mEq/L (ref 3.7–5.3)
Sodium: 140 mEq/L (ref 137–147)

## 2013-08-18 LAB — CBC WITH DIFFERENTIAL/PLATELET
Basophils Absolute: 0 10*3/uL (ref 0.0–0.1)
Basophils Relative: 0 % (ref 0–1)
Eosinophils Absolute: 0 10*3/uL (ref 0.0–0.7)
Eosinophils Relative: 0 % (ref 0–5)
HEMATOCRIT: 45.8 % (ref 39.0–52.0)
Hemoglobin: 15.4 g/dL (ref 13.0–17.0)
LYMPHS PCT: 16 % (ref 12–46)
Lymphs Abs: 1.6 10*3/uL (ref 0.7–4.0)
MCH: 31 pg (ref 26.0–34.0)
MCHC: 33.6 g/dL (ref 30.0–36.0)
MCV: 92.3 fL (ref 78.0–100.0)
MONO ABS: 1.1 10*3/uL — AB (ref 0.1–1.0)
MONOS PCT: 11 % (ref 3–12)
NEUTROS ABS: 7.1 10*3/uL (ref 1.7–7.7)
Neutrophils Relative %: 72 % (ref 43–77)
Platelets: 192 10*3/uL (ref 150–400)
RBC: 4.96 MIL/uL (ref 4.22–5.81)
RDW: 13.4 % (ref 11.5–15.5)
WBC: 9.9 10*3/uL (ref 4.0–10.5)

## 2013-08-18 LAB — BLOOD GAS, ARTERIAL
Acid-Base Excess: 0.8 mmol/L (ref 0.0–2.0)
Bicarbonate: 26.1 mEq/L — ABNORMAL HIGH (ref 20.0–24.0)
DRAWN BY: 23534
O2 Content: 3 L/min
O2 Saturation: 87.5 %
PCO2 ART: 51.8 mmHg — AB (ref 35.0–45.0)
PH ART: 7.324 — AB (ref 7.350–7.450)
Patient temperature: 37
TCO2: 23.2 mmol/L (ref 0–100)
pO2, Arterial: 54.1 mmHg — ABNORMAL LOW (ref 80.0–100.0)

## 2013-08-18 LAB — HEPATIC FUNCTION PANEL
ALBUMIN: 3.4 g/dL — AB (ref 3.5–5.2)
ALK PHOS: 113 U/L (ref 39–117)
ALT: 10 U/L (ref 0–53)
AST: 13 U/L (ref 0–37)
Bilirubin, Direct: 0.2 mg/dL (ref 0.0–0.3)
Indirect Bilirubin: 0.5 mg/dL (ref 0.3–0.9)
TOTAL PROTEIN: 7.3 g/dL (ref 6.0–8.3)
Total Bilirubin: 0.7 mg/dL (ref 0.3–1.2)

## 2013-08-18 LAB — MRSA PCR SCREENING: MRSA BY PCR: POSITIVE — AB

## 2013-08-18 LAB — TROPONIN I
Troponin I: <0.3 ng/mL (ref <0.30)
Troponin I: <0.3 ng/mL (ref <0.30)

## 2013-08-18 LAB — PRO B NATRIURETIC PEPTIDE: Pro B Natriuretic peptide (BNP): 1078 pg/mL — ABNORMAL HIGH (ref 0–125)

## 2013-08-18 MED ORDER — VITAMIN B-12 1000 MCG PO TABS
1000.0000 ug | ORAL_TABLET | Freq: Every day | ORAL | Status: DC
  Administered 2013-08-18 – 2013-08-20 (×3): 1000 ug via ORAL
  Filled 2013-08-18 (×3): qty 1

## 2013-08-18 MED ORDER — SODIUM CHLORIDE 0.9 % IJ SOLN
3.0000 mL | Freq: Two times a day (BID) | INTRAMUSCULAR | Status: DC
  Administered 2013-08-19 – 2013-08-20 (×3): 3 mL via INTRAVENOUS

## 2013-08-18 MED ORDER — IPRATROPIUM-ALBUTEROL 0.5-2.5 (3) MG/3ML IN SOLN
3.0000 mL | Freq: Once | RESPIRATORY_TRACT | Status: AC
  Administered 2013-08-18: 3 mL via RESPIRATORY_TRACT
  Filled 2013-08-18: qty 3

## 2013-08-18 MED ORDER — CETYLPYRIDINIUM CHLORIDE 0.05 % MT LIQD
7.0000 mL | Freq: Two times a day (BID) | OROMUCOSAL | Status: DC
  Administered 2013-08-18: 7 mL via OROMUCOSAL

## 2013-08-18 MED ORDER — SODIUM CHLORIDE 0.9 % IJ SOLN
INTRAMUSCULAR | Status: AC
  Filled 2013-08-18: qty 600

## 2013-08-18 MED ORDER — VITAMIN C 500 MG PO TABS
500.0000 mg | ORAL_TABLET | Freq: Every day | ORAL | Status: DC
  Administered 2013-08-18 – 2013-08-20 (×3): 500 mg via ORAL
  Filled 2013-08-18 (×3): qty 1

## 2013-08-18 MED ORDER — ALBUTEROL SULFATE (2.5 MG/3ML) 0.083% IN NEBU
2.5000 mg | INHALATION_SOLUTION | Freq: Once | RESPIRATORY_TRACT | Status: AC
  Administered 2013-08-18: 2.5 mg via RESPIRATORY_TRACT
  Filled 2013-08-18: qty 3

## 2013-08-18 MED ORDER — METHYLPREDNISOLONE SODIUM SUCC 125 MG IJ SOLR
60.0000 mg | Freq: Four times a day (QID) | INTRAMUSCULAR | Status: DC
Start: 2013-08-18 — End: 2013-08-20
  Administered 2013-08-18 – 2013-08-20 (×8): 60 mg via INTRAVENOUS
  Filled 2013-08-18 (×8): qty 2

## 2013-08-18 MED ORDER — FUROSEMIDE 10 MG/ML IJ SOLN
20.0000 mg | Freq: Once | INTRAMUSCULAR | Status: AC
  Administered 2013-08-18: 20 mg via INTRAVENOUS
  Filled 2013-08-18: qty 2

## 2013-08-18 MED ORDER — ALPRAZOLAM 0.25 MG PO TABS
0.2500 mg | ORAL_TABLET | Freq: Three times a day (TID) | ORAL | Status: DC | PRN
  Administered 2013-08-18 – 2013-08-19 (×2): 0.25 mg via ORAL
  Filled 2013-08-18 (×2): qty 1

## 2013-08-18 MED ORDER — FUROSEMIDE 10 MG/ML IJ SOLN
20.0000 mg | Freq: Every day | INTRAMUSCULAR | Status: DC
  Administered 2013-08-18: 20 mg via INTRAVENOUS
  Filled 2013-08-18: qty 2

## 2013-08-18 MED ORDER — ENOXAPARIN SODIUM 40 MG/0.4ML ~~LOC~~ SOLN
40.0000 mg | SUBCUTANEOUS | Status: DC
  Administered 2013-08-18 – 2013-08-19 (×2): 40 mg via SUBCUTANEOUS
  Filled 2013-08-18 (×2): qty 0.4

## 2013-08-18 MED ORDER — SODIUM CHLORIDE 0.9 % IJ SOLN
INTRAMUSCULAR | Status: AC
  Administered 2013-08-18: 3 mL
  Filled 2013-08-18: qty 30

## 2013-08-18 MED ORDER — IPRATROPIUM-ALBUTEROL 0.5-2.5 (3) MG/3ML IN SOLN
3.0000 mL | RESPIRATORY_TRACT | Status: DC
  Administered 2013-08-18 – 2013-08-19 (×5): 3 mL via RESPIRATORY_TRACT
  Filled 2013-08-18 (×6): qty 3

## 2013-08-18 MED ORDER — ONDANSETRON HCL 4 MG PO TABS: 4.0000 mg | ORAL_TABLET | Freq: Four times a day (QID) | ORAL | Status: DC | PRN

## 2013-08-18 MED ORDER — IPRATROPIUM-ALBUTEROL 0.5-2.5 (3) MG/3ML IN SOLN: 3.0000 mL | Freq: Once | RESPIRATORY_TRACT | Status: DC

## 2013-08-18 MED ORDER — IOHEXOL 350 MG/ML SOLN
100.0000 mL | Freq: Once | INTRAVENOUS | Status: AC | PRN
  Administered 2013-08-18: 100 mL via INTRAVENOUS

## 2013-08-18 MED ORDER — CHLORHEXIDINE GLUCONATE 0.12 % MT SOLN
15.0000 mL | Freq: Two times a day (BID) | OROMUCOSAL | Status: DC
  Administered 2013-08-18: 15 mL via OROMUCOSAL
  Filled 2013-08-18: qty 15

## 2013-08-18 MED ORDER — AMLODIPINE BESYLATE 5 MG PO TABS
5.0000 mg | ORAL_TABLET | Freq: Every day | ORAL | Status: DC
  Administered 2013-08-18 – 2013-08-20 (×3): 5 mg via ORAL
  Filled 2013-08-18 (×3): qty 1

## 2013-08-18 MED ORDER — ACETAMINOPHEN 650 MG RE SUPP: 650.0000 mg | Freq: Four times a day (QID) | RECTAL | Status: DC | PRN

## 2013-08-18 MED ORDER — VITAMIN D 1000 UNITS PO TABS
1000.0000 [IU] | ORAL_TABLET | Freq: Every day | ORAL | Status: DC
  Administered 2013-08-18 – 2013-08-20 (×3): 1000 [IU] via ORAL
  Filled 2013-08-18 (×3): qty 1

## 2013-08-18 MED ORDER — BUDESONIDE-FORMOTEROL FUMARATE 160-4.5 MCG/ACT IN AERO
2.0000 | INHALATION_SPRAY | Freq: Two times a day (BID) | RESPIRATORY_TRACT | Status: DC
  Administered 2013-08-18 – 2013-08-19 (×2): 2 via RESPIRATORY_TRACT
  Filled 2013-08-18: qty 6

## 2013-08-18 MED ORDER — ACETAMINOPHEN 325 MG PO TABS: 650.0000 mg | ORAL_TABLET | Freq: Four times a day (QID) | ORAL | Status: DC | PRN

## 2013-08-18 MED ORDER — ATORVASTATIN CALCIUM 10 MG PO TABS
10.0000 mg | ORAL_TABLET | Freq: Every day | ORAL | Status: DC
  Administered 2013-08-18 – 2013-08-19 (×2): 10 mg via ORAL
  Filled 2013-08-18 (×2): qty 1

## 2013-08-18 MED ORDER — LEVOFLOXACIN IN D5W 750 MG/150ML IV SOLN
750.0000 mg | INTRAVENOUS | Status: DC
  Administered 2013-08-18 – 2013-08-19 (×2): 750 mg via INTRAVENOUS
  Filled 2013-08-18 (×2): qty 150

## 2013-08-18 MED ORDER — ALBUTEROL SULFATE (2.5 MG/3ML) 0.083% IN NEBU: 2.5000 mg | INHALATION_SOLUTION | RESPIRATORY_TRACT | Status: DC | PRN

## 2013-08-18 MED ORDER — FLUOXETINE HCL 20 MG PO CAPS
20.0000 mg | ORAL_CAPSULE | Freq: Every day | ORAL | Status: DC
  Administered 2013-08-18 – 2013-08-20 (×3): 20 mg via ORAL
  Filled 2013-08-18 (×3): qty 1

## 2013-08-18 MED ORDER — ONDANSETRON HCL 4 MG/2ML IJ SOLN: 4.0000 mg | Freq: Four times a day (QID) | INTRAMUSCULAR | Status: DC | PRN

## 2013-08-18 NOTE — ED Provider Notes (Addendum)
CSN: [id number]     Arrival date & time 08/18/13  1032 History  This chart was scribed for Maudry Diego, MD by Irene Pap, ED Scribe. This patient was seen in room APA16A/APA16A and patient care was started at 10:44 AM.    Chief Complaint  Patient presents with  . Shortness of Breath   Patient is a 73 y.o. male presenting with shortness of breath. The history is provided by the patient. No language interpreter was used.  Shortness of Breath Severity:  Moderate Onset quality:  Sudden Duration:  2 days Timing:  Intermittent Chronicity:  New Ineffective treatments:  Oxygen Associated symptoms: cough and headaches   Associated symptoms: no abdominal pain, no chest pain, no fever and no rash    HPI Comments: [name] is a 73 y.o. male with a history of COPD who presents to the Emergency Department complaining of SOB onset two days ago. He states that he has a headache when coughing. Upon arrival, patient reports that he does not have SOB, but rather weakness, which is why he called EMS. He states that he was given breathing treatments upon arrival. He reports that Dr. Buelah Manis is his PCP. He states that he uses 4-5 L O2 at home. Patient denies fever.   Past Medical History  Diagnosis Date  . Hypertension   . Lung disease   . Chest pain   . GERD (gastroesophageal reflux disease)   . Hyperlipidemia   . Anxiety   . PVD (peripheral vascular disease)     RLE ABI 0.8  . Cancer     skin  . COPD (chronic obstructive pulmonary disease)     [facility]  . Arthritis    Past Surgical History  Procedure Laterality Date  . Cataract extraction      left eye   No family history on file. History  Substance Use Topics  . Smoking status: Former Smoker -- 1.00 packs/day    Types: Cigarettes, Cigars  . Smokeless tobacco: Current User    Types: Snuff  . Alcohol Use: No    Review of Systems  Constitutional: Negative for fever, appetite change and fatigue.  HENT:  Negative for congestion, ear discharge and sinus pressure.   Eyes: Negative for discharge.  Respiratory: Positive for cough and shortness of breath.   Cardiovascular: Negative for chest pain.  Gastrointestinal: Negative for abdominal pain and diarrhea.  Genitourinary: Negative for frequency and hematuria.  Musculoskeletal: Negative for back pain.  Skin: Negative for rash.  Neurological: Positive for weakness and headaches. Negative for seizures.  Psychiatric/Behavioral: Negative for hallucinations.   Allergies  Review of patient's allergies indicates no known allergies.  Home Medications   Prior to Admission medications   Medication Sig Start Date End Date Taking? Authorizing Provider  albuterol (PROAIR HFA) 108 (90 BASE) MCG/ACT inhaler INHALE 2 PUFFS BY MOUTH EVERY 4 HOURS AS NEEDED FOR WHEEZING 04/20/13   Alycia Rossetti, MD  ALPRAZolam Duanne Moron) 0.25 MG tablet take 1 tablet by mouth three times a day if needed FOR ANXIETY 07/21/13   Alycia Rossetti, MD  amLODipine (NORVASC) 10 MG tablet take 1 tablet by mouth once daily 04/20/13   Alycia Rossetti, MD  atorvastatin (LIPITOR) 40 MG tablet take 1 tablet by mouth once daily 07/31/13   Alycia Rossetti, MD  budesonide-formoterol [facility]) 160-4.5 MCG/ACT inhaler inhale 2 puffs by mouth every 12 hours 09/10/12   Alycia Rossetti, MD  FLUoxetine (PROZAC) 20 MG  capsule Take 1 capsule (20 mg total) by mouth daily. For your nerves 07/29/13   Alycia Rossetti, MD  ibuprofen (ADVIL,MOTRIN) 200 MG tablet Take 200 mg by mouth 3 (three) times daily as needed. For pain    Historical Provider, MD  nitroGLYCERIN (NITROSTAT) 0.4 MG SL tablet place 1 tablet under the tongue if needed every 5 minutes for chest pain for 3 doses IF NO RELIEF AFTER 3RD DOSE CALL PRESCRIBER OR 911. 11/03/12   Alycia Rossetti, MD  tiotropium (SPIRIVA HANDIHALER) 18 MCG inhalation capsule inhale the contents of one capsule in the handihaler once daily 07/15/13   Alycia Rossetti, MD    BP 133/92  Pulse 113  Temp(Src) 98.4 F (36.9 C) (Oral)  Resp 26  Ht 5\' 7"  (1.702 m)  Wt 140 lb (63.504 kg)  BMI 21.92 kg/m2  SpO2 98%  Physical Exam  Constitutional: He is oriented to person, place, and time. He appears well-developed.  HENT:  Head: Normocephalic.  dry mucous membranes  Eyes: Conjunctivae and EOM are normal. No scleral icterus.  Neck: Neck supple. No thyromegaly present.  Cardiovascular: Regular rhythm.  Tachycardia present.  Exam reveals no gallop and no friction rub.   No murmur heard. Pulmonary/Chest: No stridor. He has no wheezes. He has no rales. He exhibits no tenderness.  moderate wheezing bilaterally  Abdominal: He exhibits no distension. There is no tenderness. There is no rebound.  Musculoskeletal: Normal range of motion. He exhibits no edema.  Lymphadenopathy:    He has no cervical adenopathy.  Neurological: He is oriented to person, place, and time. He exhibits normal muscle tone. Coordination normal.  Skin: No rash noted. No erythema.  Psychiatric: He has a normal mood and affect. His behavior is normal.    ED Course  Procedures (including critical care time) DIAGNOSTIC STUDIES: Oxygen Saturation is 98% on room air, normal by my interpretation.    COORDINATION OF CARE: 10:49 AM-Discussed treatment plan which includes labs with pt at bedside and pt agreed to plan.   Labs Review Labs Reviewed  CBC WITH DIFFERENTIAL - Abnormal; Notable for the following:    Monocytes Absolute 1.1 (*)    All other components within normal limits  BASIC METABOLIC PANEL - Abnormal; Notable for the following:    Glucose, Bld 110 (*)    GFR calc non Af Amer 67 (*)    GFR calc Af Amer 77 (*)    All other components within normal limits  PRO B NATRIURETIC PEPTIDE - Abnormal; Notable for the following:    Pro B Natriuretic peptide (BNP) 1078.0 (*)    All other components within normal limits  HEPATIC FUNCTION PANEL - Abnormal; Notable for the following:     Albumin 3.4 (*)    All other components within normal limits  BLOOD GAS, ARTERIAL - Abnormal; Notable for the following:    pH, Arterial 7.324 (*)    pCO2 arterial 51.8 (*)    pO2, Arterial 54.1 (*)    Bicarbonate 26.1 (*)    All other components within normal limits  TROPONIN I   Imaging Review Dg Chest Portable 1 View  08/18/2013   CLINICAL DATA:  Shortness of breath.  EXAM: PORTABLE CHEST - 1 VIEW  COMPARISON:  One-view chest 04/07/2011  FINDINGS: The heart size is normal. Mild pulmonary vascular congestion is evident. Changes of COPD are again noted. No focal airspace disease is present. The visualized soft tissues and bony thorax are unremarkable.  IMPRESSION: 1. Mild  pulmonary vascular congestion since prior exam suggests early congestive heart failure. 2. Stable changes of COPD. 3. Atherosclerosis.   Electronically Signed   By: Lawrence Santiago M.D.   On: 08/18/2013 11:41     EKG Interpretation   Date/Time:  Tuesday August 18 2013 10:51:29 EDT Ventricular Rate:  111 PR Interval:  137 QRS Duration: 97 QT Interval:  324 QTC Calculation: 440 R Axis:   92 Text Interpretation:  Sinus tachycardia Right atrial enlargement Right  axis deviation Borderline repolarization abnormality Confirmed by Sireen Halk   MD, Shalandra Leu [phone]) on 08/18/2013 2:45:11 PM      MDM   Final diagnoses:  None    The chart was scribed for me under my direct supervision.  I personally performed the history, physical, and medical decision making and all procedures in the evaluation of this patient.Maudry Diego, MD 08/18/13 [place], MD 08/18/13 [phone]

## 2013-08-18 NOTE — ED Notes (Signed)
Family request that pt not be admit here. States they want him to go to [facility]. EDP aware

## 2013-08-18 NOTE — ED Notes (Signed)
Pt was given solumedrol 125 mg and two breathing treatments prior to arrival

## 2013-08-18 NOTE — ED Notes (Signed)
Hospitalist here to eval for admission

## 2013-08-18 NOTE — ED Notes (Signed)
Complain of being SOB for two days

## 2013-08-18 NOTE — ED Notes (Signed)
abg drawn. Breathing treatment being done

## 2013-08-18 NOTE — H&P (Signed)
[facility] History and Physical  [name] HQP:[id number] DOB: Jun 26, 1940 DOA: 08/18/2013  Referring physician:  PCP: Vic Blackbird, MD  Specialists:   Chief Complaint: sob, cough   HPI: [name] is a 73 y.o. male with PMH of HTN, severe COPD/Emphysema, chronic respiratory failure on home oxygen presented with progressive SOB, DOE, productive cough, fever chills for several days associated with fatigue, tiredness; denies chest pain, no dizziness, no nausea, vomiting, or diarrhea denies abd pain, no focal neuro symptoms   Review of Systems: The patient denies anorexia, fever, weight loss,, vision loss, decreased hearing, hoarseness, chest pain, syncope, dyspnea on exertion, peripheral edema, balance deficits, hemoptysis, abdominal pain, melena, hematochezia, severe indigestion/heartburn, hematuria, incontinence, genital sores, muscle weakness, suspicious skin lesions, transient blindness, difficulty walking, depression, unusual weight change, abnormal bleeding, enlarged lymph nodes, angioedema, and breast masses.   Past Medical History  Diagnosis Date  . Hypertension   . Lung disease   . Chest pain   . GERD (gastroesophageal reflux disease)   . Hyperlipidemia   . Anxiety   . PVD (peripheral vascular disease)     RLE ABI 0.8  . Cancer     skin  . COPD (chronic obstructive pulmonary disease)     Intubated on Vent 2013[facility]  . Arthritis    Past Surgical History  Procedure Laterality Date  . Cataract extraction      left eye   Social History:  reports that he has quit smoking. His smoking use included Cigarettes and Cigars. He smoked 1.00 pack per day. His smokeless tobacco use includes Snuff. He reports that he does not drink alcohol or use illicit drugs. [place];  where does patient live--home, ALF, SNF? and with whom if at home? Yes;  Can patient participate in ADLs?  No Known Allergies  No family history on file.  (be sure to complete)  Prior to Admission  medications   Medication Sig Start Date End Date Taking? Authorizing Provider  albuterol (PROAIR HFA) 108 (90 BASE) MCG/ACT inhaler INHALE 2 PUFFS BY MOUTH EVERY 4 HOURS AS NEEDED FOR WHEEZING 04/20/13  Yes Alycia Rossetti, MD  albuterol (PROVENTIL HFA;VENTOLIN HFA) 108 (90 BASE) MCG/ACT inhaler Inhale 2 puffs into the lungs every 6 (six) hours as needed for wheezing or shortness of breath.   Yes Historical Provider, MD  ALPRAZolam Duanne Moron) 0.25 MG tablet take 1 tablet by mouth three times a day if needed FOR ANXIETY 07/21/13  Yes Alycia Rossetti, MD  amLODipine (NORVASC) 10 MG tablet take 1 tablet by mouth once daily 04/20/13  Yes Alycia Rossetti, MD  Ascorbic Acid (VITAMIN C WITH ROSE HIPS) 500 MG tablet Take 500 mg by mouth daily.   Yes Historical Provider, MD  atorvastatin (LIPITOR) 40 MG tablet take 1 tablet by mouth once daily 07/31/13  Yes Alycia Rossetti, MD  budesonide-formoterol [facility]) 160-4.5 MCG/ACT inhaler inhale 2 puffs by mouth every 12 hours 09/10/12  Yes Alycia Rossetti, MD  cholecalciferol (VITAMIN D) 1000 UNITS tablet Take 1,000 Units by mouth daily.   Yes Historical Provider, MD  FLUoxetine (PROZAC) 20 MG capsule Take 1 capsule (20 mg total) by mouth daily. For your nerves 07/29/13  Yes Alycia Rossetti, MD  ibuprofen (ADVIL,MOTRIN) 200 MG tablet Take 200 mg by mouth 3 (three) times daily as needed. For pain   Yes Historical Provider, MD  tiotropium (SPIRIVA HANDIHALER) 18 MCG inhalation capsule inhale the contents of one capsule in the handihaler once daily 07/15/13  Yes Alycia Rossetti, MD  vitamin A 8000 UNIT capsule Take 8,000 Units by mouth daily.   Yes Historical Provider, MD  vitamin B-12 (CYANOCOBALAMIN) 1000 MCG tablet Take 1,000 mcg by mouth daily.   Yes Historical Provider, MD  nitroGLYCERIN (NITROSTAT) 0.4 MG SL tablet place 1 tablet under the tongue if needed every 5 minutes for chest pain for 3 doses IF NO RELIEF AFTER 3RD DOSE CALL PRESCRIBER OR 911. 11/03/12    Alycia Rossetti, MD   Physical Exam: Filed Vitals:   08/18/13 1401  BP: 119/65  Pulse: 107  Temp:   Resp: 22     General:  Alert, lethargic   Eyes: eom-i  ENT: no oral ulcers   Neck: supple  Cardiovascular:   Respiratory: poor ventilation BL, wheezing   Abdomen: soft, nt nd  Skin: no rash   Musculoskeletal: no LE edema  Psychiatric: no hallucinations   Neurologic: CN 2-12 intact; motor 5/5 BL symmetric   Labs on Admission:  Basic Metabolic Panel:  Recent Labs Lab 08/18/13 1056  NA 140  K 4.3  CL 100  CO2 28  GLUCOSE 110*  BUN 13  CREATININE 1.07  CALCIUM 9.1   Liver Function Tests:  Recent Labs Lab 08/18/13 1056  AST 13  ALT 10  ALKPHOS 113  BILITOT 0.7  PROT 7.3  ALBUMIN 3.4*   No results found for this basename: LIPASE, AMYLASE,  in the last 168 hours No results found for this basename: AMMONIA,  in the last 168 hours CBC:  Recent Labs Lab 08/18/13 1056  WBC 9.9  NEUTROABS 7.1  HGB 15.4  HCT 45.8  MCV 92.3  PLT 192   Cardiac Enzymes:  Recent Labs Lab 08/18/13 1056  TROPONINI <0.30    BNP (last 3 results)  Recent Labs  08/18/13 1056  PROBNP 1078.0*   CBG: No results found for this basename: GLUCAP,  in the last 168 hours  Radiological Exams on Admission: Dg Chest Portable 1 View  08/18/2013   CLINICAL DATA:  Shortness of breath.  EXAM: PORTABLE CHEST - 1 VIEW  COMPARISON:  One-view chest 04/07/2011  FINDINGS: The heart size is normal. Mild pulmonary vascular congestion is evident. Changes of COPD are again noted. No focal airspace disease is present. The visualized soft tissues and bony thorax are unremarkable.  IMPRESSION: 1. Mild pulmonary vascular congestion since prior exam suggests early congestive heart failure. 2. Stable changes of COPD. 3. Atherosclerosis.   Electronically Signed   By: Lawrence Santiago M.D.   On: 08/18/2013 11:41    EKG: Independently reviewed.   Assessment/Plan Principal Problem:   COPD  (chronic obstructive pulmonary disease) Active Problems:   Essential hypertension, benign  73 y.o. male with PMH of HTN, severe COPD/Emphysema, chronic respiratory failure on home oxygen presented with progressive SOB, DOE, productive cough, fever chills for several days associated with fatigue, tiredness -admitted with COPD exacerbation; ED d/w pulmonology who will be seeing teh patient as consult  1. COPD exacerbation; CXR: no clear infiltrates;  -started IV atx, steroids;  bronchodilators, oxygen, NiPPV prn; f/u CT chest   2. Acute on chronic respiratory failure likely due to COPD; ABG: 7.32-51-54-26 -cont as above; f/u CTA chest  3. Probable underlying CHF; clinically mild hypervolemic;  -start IV diuresis; strict I/O, daily weight; obtain echo; f/u trop  4. HTN, cont home regimen; monitor   None, if consultant consulted, please document name and whether formally or informally consulted  Code Status: full (must indicate  code status--if unknown or must be presumed, indicate so) Family Communication:  D/w patient, his daughter, family at the bedside (indicate person spoken with, if applicable, with phone number if by telephone) Disposition Plan: pend clinical improvement  (indicate anticipated LOS)  Time spent: >35 minutes   [place], [place] Pager [phone]  If 7PM-7AM, please contact night-coverage [email or web address] Password [email or web address] 08/18/2013, 2:59 PM

## 2013-08-18 NOTE — ED Notes (Signed)
EDP in with pt 

## 2013-08-18 NOTE — Consult Note (Signed)
Consult requested by: [facility] Consult requested for COPD exacerbation:  HPI: This is a 73 year old with a long known history of COPD and multiple other medical problems as listed below. He had been in his usual state of health until about 4 days ago at which time he developed increasing shortness of breath. 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 No definite fever or chills. He has been getting weaker and weaker and finally got really could not get out of bed. He has not had any chest pain.  Past Medical History  Diagnosis Date  . Hypertension   . Lung disease   . Chest pain   . GERD (gastroesophageal reflux disease)   . Hyperlipidemia   . Anxiety   . PVD (peripheral vascular disease)     RLE ABI 0.8  . Cancer     skin  . COPD (chronic obstructive pulmonary disease)     [facility] on Vent 2013[facility]  . Arthritis      History reviewed. No pertinent family history.   History   Social History  . Marital Status: Married    Spouse Name: N/A    Number of Children: N/A  . Years of Education: N/A   Social History Main Topics  . Smoking status: Former Smoker -- 1.00 packs/day    Types: Cigarettes, Cigars  . Smokeless tobacco: Current User    Types: Snuff  . Alcohol Use: No  . Drug Use: No  . Sexual Activity: None   Other Topics Concern  . None   Social History Narrative  . None     ROS: except as mentioned in per the H&P. He's not had hemoptysis night sweats fever chills leg swelling orthopnea or PND     Objective: Vital signs in last 24 hours: Temp:  [98.3 F (36.8 C)-98.4 F (36.9 C)] 98.3 F (36.8 C) (08/04 1557) Pulse Rate:  [98-114] 103 (08/04 1730) Resp:  [20-30] 22 (08/04 1730) BP: (102-133)/(53-92) 118/55 mmHg (08/04 1730) SpO2:  [87 %-98 %] 94 % (08/04 1557) Weight:  [63.504 kg (140 lb)-64.4 kg (141 lb 15.6 oz)] 64.4 kg (141 lb 15.6 oz) (08/04 1557) Weight change:     Intake/Output from  previous day:    PHYSICAL EXAM He is awake and alert. He is using nasal oxygen. He looks acutely and chronically sick. His pupils react. His nose and throat are clear. His neck is supple. His chest is clear. He has diminished breath sounds bilaterally but no wheezing now. His heart is regular and his heart sounds are somewhat diminished. His abdomen is soft without masses. Extremities show no edema. Central nervous system exam is grossly intact  Lab Results: Basic Metabolic Panel:  Recent Labs  08/18/13 1056  NA 140  K 4.3  CL 100  CO2 28  GLUCOSE 110*  BUN 13  CREATININE 1.07  CALCIUM 9.1   Liver Function Tests:  Recent Labs  08/18/13 1056  AST 13  ALT 10  ALKPHOS 113  BILITOT 0.7  PROT 7.3  ALBUMIN 3.4*   No results found for this basename: LIPASE, AMYLASE,  in the last 72 hours No results found for this basename: AMMONIA,  in the last 72 hours CBC:  Recent Labs  08/18/13 1056  WBC 9.9  NEUTROABS 7.1  HGB 15.4  HCT 45.8  MCV 92.3  PLT 192   Cardiac Enzymes:  Recent Labs  08/18/13 1056 08/18/13 1600  TROPONINI <0.30 <  0.30   BNP:  Recent Labs  08/18/13 1056  PROBNP 1078.0*   D-Dimer: No results found for this basename: DDIMER,  in the last 72 hours CBG: No results found for this basename: GLUCAP,  in the last 72 hours Hemoglobin A1C: No results found for this basename: HGBA1C,  in the last 72 hours Fasting Lipid Panel: No results found for this basename: CHOL, HDL, LDLCALC, TRIG, CHOLHDL, LDLDIRECT,  in the last 72 hours Thyroid Function Tests: No results found for this basename: TSH, T4TOTAL, FREET4, T3FREE, THYROIDAB,  in the last 72 hours Anemia Panel: No results found for this basename: VITAMINB12, FOLATE, FERRITIN, TIBC, IRON, RETICCTPCT,  in the last 72 hours Coagulation: No results found for this basename: LABPROT, INR,  in the last 72 hours Urine Drug Screen: Drugs of Abuse  No results found for this basename: labopia,  cocainscrnur, labbenz, amphetmu, thcu, labbarb    Alcohol Level: No results found for this basename: ETH,  in the last 72 hours Urinalysis: No results found for this basename: COLORURINE, APPERANCEUR, LABSPEC, PHURINE, GLUCOSEU, HGBUR, BILIRUBINUR, KETONESUR, PROTEINUR, UROBILINOGEN, NITRITE, LEUKOCYTESUR,  in the last 72 hours Misc. Labs:   ABGS:  Recent Labs  08/18/13 1335  PHART 7.324*  PO2ART 54.1*  TCO2 23.2  HCO3 26.1*     MICROBIOLOGY: No results found for this or any previous visit (from the past 240 hour(s)).  Studies/Results: Ct Angio Chest Pe W/cm &/or Wo Cm  08/18/2013   CLINICAL DATA:  Shortness of breath, hypoxia, dyspnea on exertion, fevers, chills, fatigue, evaluate for pulmonary embolism.  EXAM: CT ANGIOGRAPHY CHEST WITH CONTRAST  TECHNIQUE: Multidetector CT imaging of the chest was performed using the standard protocol during bolus administration of intravenous contrast. Multiplanar CT image reconstructions and MIPs were obtained to evaluate the vascular anatomy.  CONTRAST:  178mL OMNIPAQUE IOHEXOL 350 MG/ML SOLN  COMPARISON:  04/04/2011; chest radiograph - earlier same day  FINDINGS: Vascular Findings:  There is adequate opacification of the pulmonary arterial system with the main pulmonary artery measuring 370 Hounsfield units. There are no discrete filling defects within the pulmonary arterial tree to suggest pulmonary embolism. Normal caliber the main pulmonary artery.  Normal heart size. Trace amount of pericardial fluid, presumably physiologic.  Scattered mixed calcified and noncalcified atherosclerotic plaque within a normal caliber thoracic aorta. Conventional configuration of the aortic arch. The major branch vessels of the aortic arch are patent throughout their imaged course. There is a minimal amount of eccentric mixed calcified and noncalcified atherosclerotic plaque within the left subclavian artery, not definitively resulting in a hemodynamically significant  stenosis. No definite thoracic aortic dissection or periaortic stranding.  Review of the MIP images confirms the above findings.   ----------------------------------------------------------------------------------  Nonvascular Findings:  Evaluation of the pulmonary parenchyma is degraded secondary to patient respiratory artifact.  There is an unchanged approximately 4.2 x 3.3 cm bulla within the right lung apex (image 10, series 5). Advanced emphysematous change. No pneumothorax. There is mild rather diffuse bronchial wall thickening of though the central pulmonary airways remain widely patent. No pleural effusion.  There is minimal subsegmental atelectasis primarily within the dependent portion of the right lower lobe. No discrete focal airspace opacities. No pleural effusion. No discrete pulmonary nodules given the limitations of the examination. Bilateral hilar lymph nodes are not enlarged by size criteria with index right suprahilar node measuring approximately 0.8 cm in greatest short axis diameter (image 42, series 4). No mediastinal, hilar axillary lymphadenopathy.  Limited early arterial phase evaluation  of the upper abdomen demonstrates an approximately 4.6 x 3.8 cm hypo attenuating (1 Hounsfield unit) cyst within the dome of the right lobe of the liver. An ingested radiopaque pill fragment is noted within the gastric fundus.  No acute osseus abnormalities.  Regional soft tissues appear normal. Normal appearance of the thyroid gland.  IMPRESSION: 1. No acute cardiopulmonary disease. Specifically, no evidence of pulmonary embolism. 2. Similar findings of advanced emphysematous and bronchitic change.   Electronically Signed   By: Sandi Mariscal M.D.   On: 08/18/2013 17:29   Dg Chest Portable 1 View  08/18/2013   CLINICAL DATA:  Shortness of breath.  EXAM: PORTABLE CHEST - 1 VIEW  COMPARISON:  One-view chest 04/07/2011  FINDINGS: The heart size is normal. Mild pulmonary vascular congestion is evident. Changes  of COPD are again noted. No focal airspace disease is present. The visualized soft tissues and bony thorax are unremarkable.  IMPRESSION: 1. Mild pulmonary vascular congestion since prior exam suggests early congestive heart failure. 2. Stable changes of COPD. 3. Atherosclerosis.   Electronically Signed   By: Lawrence Santiago M.D.   On: 08/18/2013 11:41    Medications:  Prior to Admission:  Prescriptions prior to admission  Medication Sig Dispense Refill  . albuterol (PROAIR HFA) 108 (90 BASE) MCG/ACT inhaler INHALE 2 PUFFS BY MOUTH EVERY 4 HOURS AS NEEDED FOR WHEEZING  8.5 g  3  . albuterol (PROVENTIL HFA;VENTOLIN HFA) 108 (90 BASE) MCG/ACT inhaler Inhale 2 puffs into the lungs every 6 (six) hours as needed for wheezing or shortness of breath.      . ALPRAZolam (XANAX) 0.25 MG tablet take 1 tablet by mouth three times a day if needed FOR ANXIETY  90 tablet  2  . amLODipine (NORVASC) 10 MG tablet take 1 tablet by mouth once daily  30 tablet  5  . Ascorbic Acid (VITAMIN C WITH ROSE HIPS) 500 MG tablet Take 500 mg by mouth daily.      Marland Kitchen atorvastatin (LIPITOR) 40 MG tablet take 1 tablet by mouth once daily  30 tablet  6  . budesonide-formoterol (SYMBICORT) 160-4.5 MCG/ACT inhaler inhale 2 puffs by mouth every 12 hours  10.2 g  6  . cholecalciferol (VITAMIN D) 1000 UNITS tablet Take 1,000 Units by mouth daily.      Marland Kitchen FLUoxetine (PROZAC) 20 MG capsule Take 1 capsule (20 mg total) by mouth daily. For your nerves  30 capsule  6  . ibuprofen (ADVIL,MOTRIN) 200 MG tablet Take 200 mg by mouth 3 (three) times daily as needed. For pain      . tiotropium (SPIRIVA HANDIHALER) 18 MCG inhalation capsule inhale the contents of one capsule in the handihaler once daily  30 capsule  6  . vitamin A 8000 UNIT capsule Take 8,000 Units by mouth daily.      . vitamin B-12 (CYANOCOBALAMIN) 1000 MCG tablet Take 1,000 mcg by mouth daily.      . nitroGLYCERIN (NITROSTAT) 0.4 MG SL tablet place 1 tablet under the tongue if  needed every 5 minutes for chest pain for 3 doses IF NO RELIEF AFTER 3RD DOSE CALL PRESCRIBER OR 911.  25 tablet  6   Scheduled: . amLODipine  5 mg Oral Daily  . antiseptic oral rinse  7 mL Mouth Rinse BID  . atorvastatin  10 mg Oral q1800  . budesonide-formoterol  2 puff Inhalation BID  . chlorhexidine  15 mL Mouth Rinse BID  . cholecalciferol  1,000 Units Oral Daily  .  enoxaparin (LOVENOX) injection  40 mg Subcutaneous Q24H  . FLUoxetine  20 mg Oral Daily  . furosemide  20 mg Intravenous Daily  . ipratropium-albuterol  3 mL Nebulization Q4H  . levofloxacin (LEVAQUIN) IV  750 mg Intravenous Q24H  . methylPREDNISolone (SOLU-MEDROL) injection  60 mg Intravenous Q6H  . sodium chloride  3 mL Intravenous Q12H  . sodium chloride      . sodium chloride      . vitamin B-12  1,000 mcg Oral Daily  . vitamin C with rose hips  500 mg Oral Daily   Continuous:  OLM:[id number], acetaminophen, albuterol, ALPRAZolam, ondansetron (ZOFRAN) IV, ondansetron  Assesment:He is admitted with COPD exacerbation. He has acute on chronic respiratory failure. He does not appear right now that he's going to require ventilator support. He has hypertension. He has somewhat elevated BNP but I think this is more related to his COPD and heart failure.  Principal Problem:   COPD (chronic obstructive pulmonary disease) Active Problems:   Essential hypertension, benign   COPD exacerbation    Plan: I agree with current treatments. He should be on steroids antibiotics et Ronney Asters. Thanks for allowing me to see him with you     LOS: 0 days   Derrious Bologna L 08/18/2013, 5:51 PM

## 2013-08-18 NOTE — ED Notes (Signed)
Pt states he had not used his oxygen in four days. States he felt better with out it.

## 2013-08-18 NOTE — ED Notes (Signed)
Breathing treatment being done 

## 2013-08-19 ENCOUNTER — Telehealth: Payer: Self-pay | Admitting: *Deleted

## 2013-08-19 DIAGNOSIS — Z87891 Personal history of nicotine dependence: Secondary | ICD-10-CM

## 2013-08-19 DIAGNOSIS — I517 Cardiomegaly: Secondary | ICD-10-CM

## 2013-08-19 DIAGNOSIS — J9600 Acute respiratory failure, unspecified whether with hypoxia or hypercapnia: Secondary | ICD-10-CM

## 2013-08-19 DIAGNOSIS — F17201 Nicotine dependence, unspecified, in remission: Secondary | ICD-10-CM

## 2013-08-19 DIAGNOSIS — J9602 Acute respiratory failure with hypercapnia: Secondary | ICD-10-CM

## 2013-08-19 DIAGNOSIS — J9611 Chronic respiratory failure with hypoxia: Secondary | ICD-10-CM

## 2013-08-19 DIAGNOSIS — R339 Retention of urine, unspecified: Secondary | ICD-10-CM

## 2013-08-19 LAB — CBC
HCT: 44.7 % (ref 39.0–52.0)
Hemoglobin: 15.1 g/dL (ref 13.0–17.0)
MCH: 30.7 pg (ref 26.0–34.0)
MCHC: 33.8 g/dL (ref 30.0–36.0)
MCV: 90.9 fL (ref 78.0–100.0)
PLATELETS: 188 10*3/uL (ref 150–400)
RBC: 4.92 MIL/uL (ref 4.22–5.81)
RDW: 13.2 % (ref 11.5–15.5)
WBC: 6.9 10*3/uL (ref 4.0–10.5)

## 2013-08-19 LAB — BASIC METABOLIC PANEL
Anion gap: 12 (ref 5–15)
BUN: 24 mg/dL — ABNORMAL HIGH (ref 6–23)
CO2: 28 mEq/L (ref 19–32)
Calcium: 9.2 mg/dL (ref 8.4–10.5)
Chloride: 101 mEq/L (ref 96–112)
Creatinine, Ser: 1.16 mg/dL (ref 0.50–1.35)
GFR calc Af Amer: 70 mL/min — ABNORMAL LOW (ref >90)
GFR, EST NON AFRICAN AMERICAN: 61 mL/min — AB (ref >90)
Glucose, Bld: 143 mg/dL — ABNORMAL HIGH (ref 70–99)
Potassium: 3.8 mEq/L (ref 3.7–5.3)
Sodium: 141 mEq/L (ref 137–147)

## 2013-08-19 LAB — TROPONIN I: Troponin I: <0.3 ng/mL (ref <0.30)

## 2013-08-19 MED ORDER — MUPIROCIN 2 % EX OINT
1.0000 "application " | TOPICAL_OINTMENT | Freq: Two times a day (BID) | CUTANEOUS | Status: DC
  Administered 2013-08-19 – 2013-08-20 (×2): 1 via NASAL
  Filled 2013-08-19: qty 22

## 2013-08-19 MED ORDER — CHLORHEXIDINE GLUCONATE CLOTH 2 % EX PADS: 6.0000 | MEDICATED_PAD | Freq: Every day | CUTANEOUS | Status: DC

## 2013-08-19 MED ORDER — CHLORHEXIDINE GLUCONATE CLOTH 2 % EX PADS
6.0000 | MEDICATED_PAD | Freq: Every day | CUTANEOUS | Status: DC
  Administered 2013-08-19: 6 via TOPICAL

## 2013-08-19 MED ORDER — IPRATROPIUM-ALBUTEROL 0.5-2.5 (3) MG/3ML IN SOLN
3.0000 mL | Freq: Three times a day (TID) | RESPIRATORY_TRACT | Status: DC
  Administered 2013-08-19 – 2013-08-20 (×2): 3 mL via RESPIRATORY_TRACT
  Filled 2013-08-19 (×2): qty 3

## 2013-08-19 MED ORDER — MUPIROCIN 2 % EX OINT: 1.0000 "application " | TOPICAL_OINTMENT | Freq: Two times a day (BID) | CUTANEOUS | Status: DC

## 2013-08-19 NOTE — Telephone Encounter (Signed)
Received fax from [facility]) stating that pt has been admitted to acute care hospital at [facility] on 08/18/13 with Emphysema admitted by Dr.Daniel Goodrich,MD Pending auth# [id number]

## 2013-08-19 NOTE — Progress Notes (Signed)
Subjective: He says he feels somewhat better. He's not coughing very much. His shortness of breath is about the same. His biggest complaint is that he's having difficulty urinating. He has not put out much urine despite having 2 doses of Lasix. He is not having dysuria he is simply having difficulty urinating.  Objective: Vital signs in last 24 hours: Temp:  [97.6 F (36.4 C)-98.4 F (36.9 C)] 97.6 F (36.4 C) (08/04 1953) Pulse Rate:  [95-114] 95 (08/05 0700) Resp:  [15-30] 17 (08/05 0700) BP: (101-133)/(53-92) 119/67 mmHg (08/05 0700) SpO2:  [87 %-98 %] 96 % (08/05 0745) Weight:  [63.504 kg (140 lb)-64.4 kg (141 lb 15.6 oz)] 64.2 kg (141 lb 8.6 oz) (08/05 0500) Weight change:  Last BM Date: 08/17/13  Intake/Output from previous day: 08/04 0701 - 08/05 0700 In: 990 [P.O.:840; IV Piggyback:150] Out: 200 [Urine:200]  PHYSICAL EXAM General appearance: alert, cooperative and no distress Resp: His chest is clear with diminished breath sounds Cardio: regular rate and rhythm, S1, S2 normal, no murmur, click, rub or gallop GI: soft, non-tender; bowel sounds normal; no masses,  no organomegaly Extremities: extremities normal, atraumatic, no cyanosis or edema  Lab Results:  Results for orders placed during the hospital encounter of 08/18/13 (from the past 48 hour(s))  CBC WITH DIFFERENTIAL     Status: Abnormal   Collection Time    08/18/13 10:56 AM      Result Value Ref Range   WBC 9.9  4.0 - 10.5 K/uL   RBC 4.96  4.22 - 5.81 MIL/uL   Hemoglobin 15.4  13.0 - 17.0 g/dL   HCT 45.8  39.0 - 52.0 %   MCV 92.3  78.0 - 100.0 fL   MCH 31.0  26.0 - 34.0 pg   MCHC 33.6  30.0 - 36.0 g/dL   RDW 13.4  11.5 - 15.5 %   Platelets 192  150 - 400 K/uL   Neutrophils Relative % 72  43 - 77 %   Neutro Abs 7.1  1.7 - 7.7 K/uL   Lymphocytes Relative 16  12 - 46 %   Lymphs Abs 1.6  0.7 - 4.0 K/uL   Monocytes Relative 11  3 - 12 %   Monocytes Absolute 1.1 (*) 0.1 - 1.0 K/uL   Eosinophils Relative 0   0 - 5 %   Eosinophils Absolute 0.0  0.0 - 0.7 K/uL   Basophils Relative 0  0 - 1 %   Basophils Absolute 0.0  0.0 - 0.1 K/uL  BASIC METABOLIC PANEL     Status: Abnormal   Collection Time    08/18/13 10:56 AM      Result Value Ref Range   Sodium 140  137 - 147 mEq/L   Potassium 4.3  3.7 - 5.3 mEq/L   Chloride 100  96 - 112 mEq/L   CO2 28  19 - 32 mEq/L   Glucose, Bld 110 (*) 70 - 99 mg/dL   BUN 13  6 - 23 mg/dL   Creatinine, Ser 1.07  0.50 - 1.35 mg/dL   Calcium 9.1  8.4 - 10.5 mg/dL   GFR calc non Af Amer 67 (*) >90 mL/min   GFR calc Af Amer 77 (*) >90 mL/min   Comment: (NOTE)     The eGFR has been calculated using the CKD EPI equation.     This calculation has not been validated in all clinical situations.     eGFR's persistently <90 mL/min signify possible Chronic Kidney  Disease.   Anion gap 12  5 - 15  TROPONIN I     Status: None   Collection Time    08/18/13 10:56 AM      Result Value Ref Range   Troponin I <0.30  <0.30 ng/mL   Comment:            Due to the release kinetics of cTnI,     a negative result within the first hours     of the onset of symptoms does not rule out     myocardial infarction with certainty.     If myocardial infarction is still suspected,     repeat the test at appropriate intervals.  PRO B NATRIURETIC PEPTIDE     Status: Abnormal   Collection Time    08/18/13 10:56 AM      Result Value Ref Range   Pro B Natriuretic peptide (BNP) 1078.0 (*) 0 - 125 pg/mL  HEPATIC FUNCTION PANEL     Status: Abnormal   Collection Time    08/18/13 10:56 AM      Result Value Ref Range   Total Protein 7.3  6.0 - 8.3 g/dL   Albumin 3.4 (*) 3.5 - 5.2 g/dL   AST 13  0 - 37 U/L   ALT 10  0 - 53 U/L   Alkaline Phosphatase 113  39 - 117 U/L   Total Bilirubin 0.7  0.3 - 1.2 mg/dL   Bilirubin, Direct 0.2  0.0 - 0.3 mg/dL   Indirect Bilirubin 0.5  0.3 - 0.9 mg/dL  BLOOD GAS, ARTERIAL     Status: Abnormal   Collection Time    08/18/13  1:35 PM      Result Value  Ref Range   O2 Content 3.0     Delivery systems NASAL CANNULA     pH, Arterial 7.324 (*) 7.350 - 7.450   pCO2 arterial 51.8 (*) 35.0 - 45.0 mmHg   pO2, Arterial 54.1 (*) 80.0 - 100.0 mmHg   Bicarbonate 26.1 (*) 20.0 - 24.0 mEq/L   TCO2 23.2  0 - 100 mmol/L   Acid-Base Excess 0.8  0.0 - 2.0 mmol/L   O2 Saturation 87.5     Patient temperature 37.0     Collection site RIGHT RADIAL     Drawn by [phone]     Sample type ARTERIAL     Allens test (pass/fail) PASS  PASS  MRSA PCR SCREENING     Status: Abnormal   Collection Time    08/18/13  4:00 PM      Result Value Ref Range   MRSA by PCR POSITIVE (*) NEGATIVE   Comment:            The GeneXpert MRSA Assay (FDA     approved for NASAL specimens     only), is one component of a     comprehensive MRSA colonization     surveillance program. It is not     intended to diagnose MRSA     infection nor to guide or     monitor treatment for     MRSA infections.     RESULT CALLED TO, READ BACK BY AND VERIFIED WITH:     DANIELS J AT 2018 ON [id number] BY FORSYTH K  TROPONIN I     Status: None   Collection Time    08/18/13  4:00 PM      Result Value Ref Range   Troponin I <0.30  <0.30 ng/mL  Comment:            Due to the release kinetics of cTnI,     a negative result within the first hours     of the onset of symptoms does not rule out     myocardial infarction with certainty.     If myocardial infarction is still suspected,     repeat the test at appropriate intervals.  TROPONIN I     Status: None   Collection Time    08/18/13  9:28 PM      Result Value Ref Range   Troponin I <0.30  <0.30 ng/mL   Comment:            Due to the release kinetics of cTnI,     a negative result within the first hours     of the onset of symptoms does not rule out     myocardial infarction with certainty.     If myocardial infarction is still suspected,     repeat the test at appropriate intervals.  TROPONIN I     Status: None   Collection Time     08/19/13  4:12 AM      Result Value Ref Range   Troponin I <0.30  <0.30 ng/mL   Comment:            Due to the release kinetics of cTnI,     a negative result within the first hours     of the onset of symptoms does not rule out     myocardial infarction with certainty.     If myocardial infarction is still suspected,     repeat the test at appropriate intervals.  BASIC METABOLIC PANEL     Status: Abnormal   Collection Time    08/19/13  4:12 AM      Result Value Ref Range   Sodium 141  137 - 147 mEq/L   Potassium 3.8  3.7 - 5.3 mEq/L   Chloride 101  96 - 112 mEq/L   CO2 28  19 - 32 mEq/L   Glucose, Bld 143 (*) 70 - 99 mg/dL   BUN 24 (*) 6 - 23 mg/dL   Comment: DELTA CHECK NOTED   Creatinine, Ser 1.16  0.50 - 1.35 mg/dL   Calcium 9.2  8.4 - 10.5 mg/dL   GFR calc non Af Amer 61 (*) >90 mL/min   GFR calc Af Amer 70 (*) >90 mL/min   Comment: (NOTE)     The eGFR has been calculated using the CKD EPI equation.     This calculation has not been validated in all clinical situations.     eGFR's persistently <90 mL/min signify possible Chronic Kidney     Disease.   Anion gap 12  5 - 15  CBC     Status: None   Collection Time    08/19/13  4:12 AM      Result Value Ref Range   WBC 6.9  4.0 - 10.5 K/uL   RBC 4.92  4.22 - 5.81 MIL/uL   Hemoglobin 15.1  13.0 - 17.0 g/dL   HCT 44.7  39.0 - 52.0 %   MCV 90.9  78.0 - 100.0 fL   MCH 30.7  26.0 - 34.0 pg   MCHC 33.8  30.0 - 36.0 g/dL   RDW 13.2  11.5 - 15.5 %   Platelets 188  150 - 400 K/uL    ABGS  Recent Labs  08/18/13 1335  PHART 7.324*  PO2ART 54.1*  TCO2 23.2  HCO3 26.1*   CULTURES Recent Results (from the past 240 hour(s))  MRSA PCR SCREENING     Status: Abnormal   Collection Time    08/18/13  4:00 PM      Result Value Ref Range Status   MRSA by PCR POSITIVE (*) NEGATIVE Final   Comment:            The GeneXpert MRSA Assay (FDA     approved for NASAL specimens     only), is one component of a     comprehensive  MRSA colonization     surveillance program. It is not     intended to diagnose MRSA     infection nor to guide or     monitor treatment for     MRSA infections.     RESULT CALLED TO, READ BACK BY AND VERIFIED WITH:     DANIELS J AT 2018 ON [id number] BY FORSYTH K   Studies/Results: Ct Angio Chest Pe W/cm &/or Wo Cm  08/18/2013   CLINICAL DATA:  Shortness of breath, hypoxia, dyspnea on exertion, fevers, chills, fatigue, evaluate for pulmonary embolism.  EXAM: CT ANGIOGRAPHY CHEST WITH CONTRAST  TECHNIQUE: Multidetector CT imaging of the chest was performed using the standard protocol during bolus administration of intravenous contrast. Multiplanar CT image reconstructions and MIPs were obtained to evaluate the vascular anatomy.  CONTRAST:  119m OMNIPAQUE IOHEXOL 350 MG/ML SOLN  COMPARISON:  04/04/2011; chest radiograph - earlier same day  FINDINGS: Vascular Findings:  There is adequate opacification of the pulmonary arterial system with the main pulmonary artery measuring 370 Hounsfield units. There are no discrete filling defects within the pulmonary arterial tree to suggest pulmonary embolism. Normal caliber the main pulmonary artery.  Normal heart size. Trace amount of pericardial fluid, presumably physiologic.  Scattered mixed calcified and noncalcified atherosclerotic plaque within a normal caliber thoracic aorta. Conventional configuration of the aortic arch. The major branch vessels of the aortic arch are patent throughout their imaged course. There is a minimal amount of eccentric mixed calcified and noncalcified atherosclerotic plaque within the left subclavian artery, not definitively resulting in a hemodynamically significant stenosis. No definite thoracic aortic dissection or periaortic stranding.  Review of the MIP images confirms the above findings.   ----------------------------------------------------------------------------------  Nonvascular Findings:  Evaluation of the pulmonary parenchyma  is degraded secondary to patient respiratory artifact.  There is an unchanged approximately 4.2 x 3.3 cm bulla within the right lung apex (image 10, series 5). Advanced emphysematous change. No pneumothorax. There is mild rather diffuse bronchial wall thickening of though the central pulmonary airways remain widely patent. No pleural effusion.  There is minimal subsegmental atelectasis primarily within the dependent portion of the right lower lobe. No discrete focal airspace opacities. No pleural effusion. No discrete pulmonary nodules given the limitations of the examination. Bilateral hilar lymph nodes are not enlarged by size criteria with index right suprahilar node measuring approximately 0.8 cm in greatest short axis diameter (image 42, series 4). No mediastinal, hilar axillary lymphadenopathy.  Limited early arterial phase evaluation of the upper abdomen demonstrates an approximately 4.6 x 3.8 cm hypo attenuating (1 Hounsfield unit) cyst within the dome of the right lobe of the liver. An ingested radiopaque pill fragment is noted within the gastric fundus.  No acute osseus abnormalities.  Regional soft tissues appear normal. Normal appearance of the thyroid gland.  IMPRESSION: 1. No acute cardiopulmonary disease. Specifically, no evidence of pulmonary  embolism. 2. Similar findings of advanced emphysematous and bronchitic change.   Electronically Signed   By: Sandi Mariscal M.D.   On: 08/18/2013 17:29   Dg Chest Portable 1 View  08/18/2013   CLINICAL DATA:  Shortness of breath.  EXAM: PORTABLE CHEST - 1 VIEW  COMPARISON:  One-view chest 04/07/2011  FINDINGS: The heart size is normal. Mild pulmonary vascular congestion is evident. Changes of COPD are again noted. No focal airspace disease is present. The visualized soft tissues and bony thorax are unremarkable.  IMPRESSION: 1. Mild pulmonary vascular congestion since prior exam suggests early congestive heart failure. 2. Stable changes of COPD. 3.  Atherosclerosis.   Electronically Signed   By: Lawrence Santiago M.D.   On: 08/18/2013 11:41    Medications:  Prior to Admission:  Prescriptions prior to admission  Medication Sig Dispense Refill  . albuterol (PROAIR HFA) 108 (90 BASE) MCG/ACT inhaler INHALE 2 PUFFS BY MOUTH EVERY 4 HOURS AS NEEDED FOR WHEEZING  8.5 g  3  . albuterol (PROVENTIL HFA;VENTOLIN HFA) 108 (90 BASE) MCG/ACT inhaler Inhale 2 puffs into the lungs every 6 (six) hours as needed for wheezing or shortness of breath.      . ALPRAZolam (XANAX) 0.25 MG tablet take 1 tablet by mouth three times a day if needed FOR ANXIETY  90 tablet  2  . amLODipine (NORVASC) 10 MG tablet take 1 tablet by mouth once daily  30 tablet  5  . Ascorbic Acid (VITAMIN C WITH ROSE HIPS) 500 MG tablet Take 500 mg by mouth daily.      Marland Kitchen atorvastatin (LIPITOR) 40 MG tablet take 1 tablet by mouth once daily  30 tablet  6  . budesonide-formoterol (SYMBICORT) 160-4.5 MCG/ACT inhaler inhale 2 puffs by mouth every 12 hours  10.2 g  6  . cholecalciferol (VITAMIN D) 1000 UNITS tablet Take 1,000 Units by mouth daily.      Marland Kitchen FLUoxetine (PROZAC) 20 MG capsule Take 1 capsule (20 mg total) by mouth daily. For your nerves  30 capsule  6  . ibuprofen (ADVIL,MOTRIN) 200 MG tablet Take 200 mg by mouth 3 (three) times daily as needed. For pain      . tiotropium (SPIRIVA HANDIHALER) 18 MCG inhalation capsule inhale the contents of one capsule in the handihaler once daily  30 capsule  6  . vitamin A 8000 UNIT capsule Take 8,000 Units by mouth daily.      . vitamin B-12 (CYANOCOBALAMIN) 1000 MCG tablet Take 1,000 mcg by mouth daily.      . nitroGLYCERIN (NITROSTAT) 0.4 MG SL tablet place 1 tablet under the tongue if needed every 5 minutes for chest pain for 3 doses IF NO RELIEF AFTER 3RD DOSE CALL PRESCRIBER OR 911.  25 tablet  6   Scheduled: . amLODipine  5 mg Oral Daily  . atorvastatin  10 mg Oral q1800  . budesonide-formoterol  2 puff Inhalation BID  . cholecalciferol   1,000 Units Oral Daily  . enoxaparin (LOVENOX) injection  40 mg Subcutaneous Q24H  . FLUoxetine  20 mg Oral Daily  . furosemide  20 mg Intravenous Daily  . ipratropium-albuterol  3 mL Nebulization Q4H  . levofloxacin (LEVAQUIN) IV  750 mg Intravenous Q24H  . methylPREDNISolone (SOLU-MEDROL) injection  60 mg Intravenous Q6H  . sodium chloride  3 mL Intravenous Q12H  . vitamin B-12  1,000 mcg Oral Daily  . vitamin C with rose hips  500 mg Oral Daily   Continuous:  RNH:[id number], acetaminophen, albuterol, ALPRAZolam, ondansetron (ZOFRAN) IV, ondansetron  Assesment: He was admitted with acute respiratory failure on the basis of COPD exacerbation. He seems to have improved. He was very weak on admission. He's having trouble urinating. Principal Problem:   COPD (chronic obstructive pulmonary disease) Active Problems:   Essential hypertension, benign   COPD exacerbation    Plan: I have taken the liberty of ordering a bladder scan to see if he has acute urinary retention. Otherwise continue his IV antibiotics steroids etc.    LOS: 1 day   Ayanni Tun L 08/19/2013, 7:57 AM

## 2013-08-19 NOTE — Progress Notes (Signed)
UR review Complete.

## 2013-08-19 NOTE — Progress Notes (Signed)
PROGRESS NOTE  [name] UEA:[id number] DOB: Jul 29, 1940 DOA: 08/18/2013 PCP: Vic Blackbird, MD  Summary: 73 year old man with history of oxygen dependent COPD, chronic hypoxic respiratory failure presented with increasing shortness of breath, cough, fever and chills. Reported noncompliance with oxygen therapy for 4 days prior to admission. Admitted for COPD exacerbation, acute on chronic hypoxic respiratory failure with hypercapnia compensated.  Assessment/Plan: 1. Acute COPD exacerbation, modestly improved. Remains stable, no indication for intubation or BiPAP. 2. Acute hypercapnic respiratory failure. As above, stable/some improvement. 3. Oxygen-dependent COPD on 3-4 L at home. 4. Chronic hypoxic respiratory failure secondary to COPD 5. Possible CHF? Hypervolemic? No evidence of volume overload on examination today. Echocardiogram pending. Significance of elevated BNP and clear. 6. Urinary retention. No history of home. Foley catheter today. 7. Tobacco dependence in remission. Has not smoked in one year. 8. Chronic chest pain secondary to cough according to chart review. 9. Anxiety. Continue Prozac   Overall appears stable, appreciate pulmonology evaluation. Continue steroids, antibiotics, bronchodilators and oxygen.  Followup echocardiogram; troponins negative.  Transfer to telemetry.  Discussed in detail with daughter at bedside and with RN.  Code Status: full code DVT prophylaxis: Lovenox Family Communication:  Disposition Plan: home, likely 2-3 days  Murray Hodgkins, MD  [facility]  Pager [phone] If 7PM-7AM, please contact night-coverage at [email or web address], password [facility] 08/19/2013, 8:11 AM  LOS: 1 day   Consultants:  Pulmonology  Procedures:    Antibiotics:  Levaquin 8/4 >>   HPI/Subjective: Feels about the same today in regard to respiratory status. Breathing okay. No pain reported. Poor appetite. Complains of difficulty urinating. Bladder scan  showed urinary retention, Foley catheter was inserted this morning.  Objective: Filed Vitals:   08/19/13 0500 08/19/13 0600 08/19/13 0700 08/19/13 0745  BP: 118/65 116/61 119/67   Pulse: 96 95 95   Temp:      TempSrc:      Resp: 21 15 17    Height:      Weight: 64.2 kg (141 lb 8.6 oz)     SpO2:    96%    Intake/Output Summary (Last 24 hours) at 08/19/13 0811 Last data filed at 08/18/13 2200  Gross per 24 hour  Intake    990 ml  Output    200 ml  Net    790 ml     Filed Weights   08/18/13 1036 08/18/13 1557 08/19/13 0500  Weight: 63.504 kg (140 lb) 64.4 kg (141 lb 15.6 oz) 64.2 kg (141 lb 8.6 oz)    Exam:     Afebrile, VSS, on 3 L [place]. Appears calm, comfortable, eating breakfast.  Psych. Alert. Speech fluent and appropriate.  Eyes. Pupils, irises, lids appear unremarkable.  ENT. Grossly unremarkable  Cardiovascular. Regular rate and rhythm. No murmur, rub or gallop. No lower extremity edema. Telemetry sinus rhythm.  Respiratory. Fair movement, distant breath sounds, no frank wheezes, rales or rhonchi. Mild increased respiratory effort.  Abdomen. Soft.  Skin. No rash or induration noted.  Data Reviewed: I/O: UOP 200 recorded  Chemistry: BUN elevated but creatinine remains within normal limits. Potassium normal. Anion gap normal. Troponins negative. BNP 1078.  Heme: CBC within normal limits.  Imaging: CT angiogram of the chest no acute cardiopulmonary disease, no pulmonary embolism. Advanced emphysematous and bronchitic change.  Scheduled Meds: . amLODipine  5 mg Oral Daily  . atorvastatin  10 mg Oral q1800  . budesonide-formoterol  2 puff Inhalation BID  . cholecalciferol  1,000 Units Oral Daily  .  enoxaparin (LOVENOX) injection  40 mg Subcutaneous Q24H  . FLUoxetine  20 mg Oral Daily  . furosemide  20 mg Intravenous Daily  . ipratropium-albuterol  3 mL Nebulization Q4H  . levofloxacin (LEVAQUIN) IV  750 mg Intravenous Q24H  . methylPREDNISolone  (SOLU-MEDROL) injection  60 mg Intravenous Q6H  . sodium chloride  3 mL Intravenous Q12H  . vitamin B-12  1,000 mcg Oral Daily  . vitamin C with rose hips  500 mg Oral Daily   Continuous Infusions:   Principal Problem:   Acute respiratory failure with hypercapnia Active Problems:   COPD (chronic obstructive pulmonary disease)   Essential hypertension, benign   COPD exacerbation   Urinary retention   Tobacco dependence in remission   Chronic respiratory failure with hypoxia   Time spent 20 minutes

## 2013-08-19 NOTE — Progress Notes (Signed)
PT TRANSFERRING TO 300. TRANSFER REPORT CALLED TO Janan Ridge RN  PT ALERT AND ORIENTED. FOLEY CATH PATENT. NSL PATENT. LUNG SOUNDS DIMINISHED. O2 SAT 94% ON O2 AT 3L/MIN VIA  .

## 2013-08-19 NOTE — Progress Notes (Signed)
  Echocardiogram 2D Echocardiogram has been performed.  [name], [place] 08/19/2013, 12:01 PM

## 2013-08-20 DIAGNOSIS — J9610 Chronic respiratory failure, unspecified whether with hypoxia or hypercapnia: Secondary | ICD-10-CM

## 2013-08-20 MED ORDER — IPRATROPIUM-ALBUTEROL 0.5-2.5 (3) MG/3ML IN SOLN: 3.0000 mL | Freq: Two times a day (BID) | RESPIRATORY_TRACT | Status: DC

## 2013-08-20 MED ORDER — LEVOFLOXACIN 500 MG PO TABS: 500.0000 mg | ORAL_TABLET | Freq: Every day | ORAL | Status: DC

## 2013-08-20 MED ORDER — LEVOFLOXACIN 500 MG PO TABS
500.0000 mg | ORAL_TABLET | Freq: Every day | ORAL | Status: DC
  Administered 2013-08-20: 500 mg via ORAL
  Filled 2013-08-20: qty 1

## 2013-08-20 MED ORDER — PREDNISONE 20 MG PO TABS: 40.0000 mg | ORAL_TABLET | Freq: Every day | ORAL | Status: DC

## 2013-08-20 MED ORDER — PREDNISONE 10 MG PO TABS: ORAL_TABLET | ORAL | Status: DC

## 2013-08-20 NOTE — Care Management Note (Signed)
    Page 1 of 1   08/20/2013     1:13:58 PM CARE MANAGEMENT NOTE 08/20/2013  Patient:  [name], [name]   Account Number:  [id number]  Date Initiated:  08/20/2013  Documentation initiated by:  Jolene Provost  Subjective/Objective Assessment:   Patient admitted with complaints of SOB. Patient lives at home with family. Patient independent with ADL's Patient has home oxygen at 4lpm n/c that he gets from [facility].     Action/Plan:   Patient states he will discharge home with self care. Discharged planned for today. Pt has no CM needs at this time.   Anticipated DC Date:  08/20/2013   Anticipated DC Plan:  [place]  CM consult      Choice offered to / List presented to:             Status of service:  Completed, signed off Medicare Important Message given?   (If response is "NO", the following Medicare IM given date fields will be blank) Date Medicare IM given:   Medicare IM given by:   Date Additional Medicare IM given:   Additional Medicare IM given by:    Discharge Disposition:  HOME/SELF CARE  Per UR Regulation:    If discussed at Long Length of Stay Meetings, dates discussed:    Comments:  08/20/2013 Kenneth, RN, MSN, PCCN.

## 2013-08-20 NOTE — Progress Notes (Signed)
PROGRESS NOTE  JKAI ARWOOD JAS:[id number] DOB: 1940/09/26 DOA: 08/18/2013 PCP: Vic Blackbird, MD  Summary: 73 year old man with history of oxygen dependent COPD, chronic hypoxic respiratory failure presented with increasing shortness of breath, cough, fever and chills. Reported noncompliance with oxygen therapy for 4 days prior to admission. Admitted for COPD exacerbation, acute on chronic hypoxic respiratory failure with hypercapnia.  Assessment/Plan: 1. Acute COPD exacerbation, continues to improve. Per chart 2. Acute hypercapnic respiratory failure. Resolved. Secondary to COPD. 3. Oxygen-dependent COPD on 3-4 L at home. Stable. 4. Chronic hypoxic respiratory failure secondary to COPD 5. CHF was questioned on admission secondary to elevated BNP and chest x-ray findings, however, CT chest without evidence of heart failure. Echocardiogram was grade 1 diastolic dysfunction. No evidence of heart failure at this time. 6. Urinary retention. No history time. 7. Tobacco dependence in remission. Has not smoked in one year. 8. Chronic chest pain secondary to cough according to chart review. 9. Anxiety. Continue Prozac   Discontinue Foley catheter. Voiding trial.  Discussed with Dr. Luan Pulling, pulmonologist, clear for discharge.  Home today with prednisone taper, complete antibiotics.  Discussed with daughter at bedside.  Murray Hodgkins, MD  [facility]  Pager [phone] If 7PM-7AM, please contact night-coverage at [email or web address], password [facility] 08/20/2013, 11:18 AM  LOS: 2 days   Consultants:  Pulmonology  Procedures:  2-D echocardiogram: LVEF 60-65%. Grade 1 diastolic dysfunction.  Antibiotics:  Levaquin 8/4 >> 8/8  HPI/Subjective: Feels better. Breathing better. No complaints. Wants to go home.  Objective: Filed Vitals:   08/19/13 2259 08/20/13 0601 08/20/13 0603 08/20/13 0724  BP: 111/57 91/55    Pulse: 98 80    Temp: 98 F (36.7 C) 97.2 F (36.2 C)    TempSrc:  Oral Oral    Resp: 20 20    Height:      Weight:   67.586 kg (149 lb)   SpO2: 92% 93%  95%    Intake/Output Summary (Last 24 hours) at 08/20/13 1118 Last data filed at 08/20/13 1001  Gross per 24 hour  Intake    960 ml  Output    600 ml  Net    360 ml     Filed Weights   08/18/13 1557 08/19/13 0500 08/20/13 0603  Weight: 64.4 kg (141 lb 15.6 oz) 64.2 kg (141 lb 8.6 oz) 67.586 kg (149 lb)    Exam:     Afebrile, vitals stable. Stable hypoxia on 3 L. Gen. Appears calm and comfortable. Psych. Alert. Speech fluent and clear. Cardiovascular. Regular rate and rhythm. No murmur, rub or gallop. No lower extremity edema. Respiratory. Generally clear to auscultation without frank wheezes, rales or rhonchi. Normal respiratory effort.  Data Reviewed: I/O: UOP 1400    Scheduled Meds: . amLODipine  5 mg Oral Daily  . atorvastatin  10 mg Oral q1800  . Chlorhexidine Gluconate Cloth  6 each Topical Q0600  . cholecalciferol  1,000 Units Oral Daily  . enoxaparin (LOVENOX) injection  40 mg Subcutaneous Q24H  . FLUoxetine  20 mg Oral Daily  . ipratropium-albuterol  3 mL Nebulization BID  . levofloxacin (LEVAQUIN) IV  750 mg Intravenous Q24H  . methylPREDNISolone (SOLU-MEDROL) injection  60 mg Intravenous Q6H  . mupirocin ointment  1 application Nasal BID  . sodium chloride  3 mL Intravenous Q12H  . vitamin B-12  1,000 mcg Oral Daily  . vitamin C with rose hips  500 mg Oral Daily   Continuous Infusions:   Principal Problem:   Acute  respiratory failure with hypercapnia Active Problems:   COPD (chronic obstructive pulmonary disease)   Essential hypertension, benign   COPD exacerbation   Urinary retention   Tobacco dependence in remission   Chronic respiratory failure with hypoxia

## 2013-08-20 NOTE — Discharge Summary (Signed)
Physician Discharge Summary  [name] UMP:[id number] DOB: [birth year] DOA: 08/18/2013  PCP: Vic Blackbird, MD  Admit date: 08/18/2013 Discharge date: 08/20/2013  Recommendations for Outpatient Follow-up:  1. Resolution of acute COPD exacerbation. 2. Continue to encourage adherence to 24/7 oxygen.    Follow-up Information   Follow up with Vic Blackbird, MD. Schedule an appointment as soon as possible for a visit in 1 week.   Specialty:  Family Medicine   Contact information:   [address] [place] [address] [phone]       Follow up with HAWKINS,EDWARD L, MD. Schedule an appointment as soon as possible for a visit in 2 weeks.   Specialty:  Pulmonary Disease   Contact information:   [address]  [place] [address] [phone]      Discharge Diagnoses:  1. Acute CVA exacerbation 2. Acute hypercapnic respiratory failure 3. Oxygen-dependent COPD 4. Chronic hypoxic respiratory failure secondary to COPD 5. Chronic diastolic dysfunction 6. Urinary retention 7. Tobacco dependence in remission  Discharge Condition: Improved Disposition: Home  Diet recommendation: Regular  Filed Weights   08/18/13 1557 08/19/13 0500 08/20/13 0603  Weight: 64.4 kg (141 lb 15.6 oz) 64.2 kg (141 lb 8.6 oz) 67.586 kg (149 lb)    History of present illness:  73 year old man with history of oxygen dependent COPD, chronic hypoxic respiratory failure presented with increasing shortness of breath, cough, fever and chills. Reported noncompliance with oxygen therapy for 4 days prior to admission. Admitted for COPD exacerbation, acute on chronic hypoxic respiratory failure with hypercapnia.  Hospital Course:  Patient was admitted, seen in consultation with pulmonology, treated with steroids, antibiotics and bronchodilators. His condition rapidly improved in COPD appears to be at baseline this point. Likely his condition was precipitated by noncompliance with  oxygen therapy at home. In discussion with pulmonology today he is stable for discharge.  1. Acute COPD exacerbation, appears resolved. 2. Acute hypercapnic respiratory failure. Resolved. Secondary to COPD. 3. Oxygen-dependent COPD on 3-4 L at home. Stable. 4. Chronic hypoxic respiratory failure secondary to COPD 5. CHF was questioned on admission secondary to elevated BNP and chest x-ray findings, however, CT chest without evidence of heart failure. Echocardiogram was grade 1 diastolic dysfunction. No evidence of heart failure at this time. 6. Urinary retention. No history time. 7. Tobacco dependence in remission. Has not smoked in one year. 8. Chronic chest pain secondary to cough according to chart review. 9. Anxiety. Continue Prozac Home today with prednisone taper, complete antibiotics.   Consultants:  Pulmonology Procedures:  2-D echocardiogram: LVEF 60-65%. Grade 1 diastolic dysfunction. Antibiotics:  Levaquin 8/4 >> 8/8  Discharge Instructions  Discharge Instructions   Activity as tolerated - No restrictions    Complete by:  As directed      Diet - low sodium heart healthy    Complete by:  As directed      Discharge instructions    Complete by:  As directed   Call physician or seek immediate medical attention for increased shortness of breath, wheezing or worsening of condition. Be sure to complete prednisone and Levaquin for your lungs. Be sure to use oxygen all the time.            Medication List         albuterol 108 (90 BASE) MCG/ACT inhaler  Commonly known as:  PROAIR HFA  INHALE 2 PUFFS BY MOUTH EVERY 4 HOURS AS NEEDED FOR WHEEZING     ALPRAZolam 0.25  MG tablet  Commonly known as:  XANAX  take 1 tablet by mouth three times a day if needed FOR ANXIETY     amLODipine 10 MG tablet  Commonly known as:  NORVASC  take 1 tablet by mouth once daily     atorvastatin 40 MG tablet  Commonly known as:  LIPITOR  take 1 tablet by mouth once daily      budesonide-formoterol 160-4.5 MCG/ACT inhaler  Commonly known as:  SYMBICORT  inhale 2 puffs by mouth every 12 hours     cholecalciferol 1000 UNITS tablet  Commonly known as:  VITAMIN D  Take 1,000 Units by mouth daily.     FLUoxetine 20 MG capsule  Commonly known as:  PROZAC  Take 1 capsule (20 mg total) by mouth daily. For your nerves     ibuprofen 200 MG tablet  Commonly known as:  ADVIL,MOTRIN  Take 200 mg by mouth 3 (three) times daily as needed. For pain     levofloxacin 500 MG tablet  Commonly known as:  LEVAQUIN  Take 1 tablet (500 mg total) by mouth daily. At 2 PM     nitroGLYCERIN 0.4 MG SL tablet  Commonly known as:  NITROSTAT  place 1 tablet under the tongue if needed every 5 minutes for chest pain for 3 doses IF NO RELIEF AFTER 3RD DOSE CALL PRESCRIBER OR 911.     predniSONE 10 MG tablet  Commonly known as:  DELTASONE  Start 8/7 . Take 40 mg by mouth daily for 5 days, then take 20 mg by mouth daily for 5 days, then take 10 mg by mouth daily for 5 days, then stop.     tiotropium 18 MCG inhalation capsule  Commonly known as:  SPIRIVA HANDIHALER  inhale the contents of one capsule in the handihaler once daily     vitamin A 8000 UNIT capsule  Take 8,000 Units by mouth daily.     vitamin B-12 1000 MCG tablet  Commonly known as:  CYANOCOBALAMIN  Take 1,000 mcg by mouth daily.     vitamin C with rose hips 500 MG tablet  Take 500 mg by mouth daily.       No Known Allergies  The results of significant diagnostics from this hospitalization (including imaging, microbiology, ancillary and laboratory) are listed below for reference.    Significant Diagnostic Studies: Ct Angio Chest Pe W/cm &/or Wo Cm  08/18/2013   CLINICAL DATA:  Shortness of breath, hypoxia, dyspnea on exertion, fevers, chills, fatigue, evaluate for pulmonary embolism.  EXAM: CT ANGIOGRAPHY CHEST WITH CONTRAST  TECHNIQUE: Multidetector CT imaging of the chest was performed using the standard protocol  during bolus administration of intravenous contrast. Multiplanar CT image reconstructions and MIPs were obtained to evaluate the vascular anatomy.  CONTRAST:  13mL OMNIPAQUE IOHEXOL 350 MG/ML SOLN  COMPARISON:  04/04/2011; chest radiograph - earlier same day  FINDINGS: Vascular Findings:  There is adequate opacification of the pulmonary arterial system with the main pulmonary artery measuring 370 Hounsfield units. There are no discrete filling defects within the pulmonary arterial tree to suggest pulmonary embolism. Normal caliber the main pulmonary artery.  Normal heart size. Trace amount of pericardial fluid, presumably physiologic.  Scattered mixed calcified and noncalcified atherosclerotic plaque within a normal caliber thoracic aorta. Conventional configuration of the aortic arch. The major branch vessels of the aortic arch are patent throughout their imaged course. There is a minimal amount of eccentric mixed calcified and noncalcified atherosclerotic plaque within the left  subclavian artery, not definitively resulting in a hemodynamically significant stenosis. No definite thoracic aortic dissection or periaortic stranding.  Review of the MIP images confirms the above findings.   ----------------------------------------------------------------------------------  Nonvascular Findings:  Evaluation of the pulmonary parenchyma is degraded secondary to patient respiratory artifact.  There is an unchanged approximately 4.2 x 3.3 cm bulla within the right lung apex (image 10, series 5). Advanced emphysematous change. No pneumothorax. There is mild rather diffuse bronchial wall thickening of though the central pulmonary airways remain widely patent. No pleural effusion.  There is minimal subsegmental atelectasis primarily within the dependent portion of the right lower lobe. No discrete focal airspace opacities. No pleural effusion. No discrete pulmonary nodules given the limitations of the examination. Bilateral  hilar lymph nodes are not enlarged by size criteria with index right suprahilar node measuring approximately 0.8 cm in greatest short axis diameter (image 42, series 4). No mediastinal, hilar axillary lymphadenopathy.  Limited early arterial phase evaluation of the upper abdomen demonstrates an approximately 4.6 x 3.8 cm hypo attenuating (1 Hounsfield unit) cyst within the dome of the right lobe of the liver. An ingested radiopaque pill fragment is noted within the gastric fundus.  No acute osseus abnormalities.  Regional soft tissues appear normal. Normal appearance of the thyroid gland.  IMPRESSION: 1. No acute cardiopulmonary disease. Specifically, no evidence of pulmonary embolism. 2. Similar findings of advanced emphysematous and bronchitic change.   Electronically Signed   By: Sandi Mariscal M.D.   On: 08/18/2013 17:29   Dg Chest Portable 1 View  08/18/2013   CLINICAL DATA:  Shortness of breath.  EXAM: PORTABLE CHEST - 1 VIEW  COMPARISON:  One-view chest 04/07/2011  FINDINGS: The heart size is normal. Mild pulmonary vascular congestion is evident. Changes of COPD are again noted. No focal airspace disease is present. The visualized soft tissues and bony thorax are unremarkable.  IMPRESSION: 1. Mild pulmonary vascular congestion since prior exam suggests early congestive heart failure. 2. Stable changes of COPD. 3. Atherosclerosis.   Electronically Signed   By: Lawrence Santiago M.D.   On: 08/18/2013 11:41    Microbiology: Recent Results (from the past 240 hour(s))  MRSA PCR SCREENING     Status: Abnormal   Collection Time    08/18/13  4:00 PM      Result Value Ref Range Status   MRSA by PCR POSITIVE (*) NEGATIVE Final   Comment:            The GeneXpert MRSA Assay (FDA     approved for NASAL specimens     only), is one component of a     comprehensive MRSA colonization     surveillance program. It is not     intended to diagnose MRSA     infection nor to guide or     monitor treatment for      MRSA infections.     RESULT CALLED TO, READ BACK BY AND VERIFIED WITH:     DANIELS J AT 2018 ON [id number] BY FORSYTH K     Labs: Basic Metabolic Panel:  Recent Labs Lab 08/18/13 1056 08/19/13 0412  NA 140 141  K 4.3 3.8  CL 100 101  CO2 28 28  GLUCOSE 110* 143*  BUN 13 24*  CREATININE 1.07 1.16  CALCIUM 9.1 9.2   Liver Function Tests:  Recent Labs Lab 08/18/13 1056  AST 13  ALT 10  ALKPHOS 113  BILITOT 0.7  PROT 7.3  ALBUMIN 3.4*  CBC:  Recent Labs Lab 08/18/13 1056 08/19/13 0412  WBC 9.9 6.9  NEUTROABS 7.1  --   HGB 15.4 15.1  HCT 45.8 44.7  MCV 92.3 90.9  PLT 192 188   Cardiac Enzymes:  Recent Labs Lab 08/18/13 1056 08/18/13 1600 08/18/13 2128 08/19/13 0412  TROPONINI <0.30 <0.30 <0.30 <0.30     Recent Labs  08/18/13 1056  PROBNP 1078.0*    Principal Problem:   Acute respiratory failure with hypercapnia Active Problems:   COPD (chronic obstructive pulmonary disease)   Essential hypertension, benign   COPD exacerbation   Urinary retention   Tobacco dependence in remission   Chronic respiratory failure with hypoxia   Time coordinating discharge: 25 minutes  Signed:  Murray Hodgkins, MD [facility] 08/20/2013, 12:53 PM

## 2013-08-20 NOTE — Progress Notes (Signed)
Subjective: He says he feels better. He has no new complaints. His echocardiogram showed good systolic function but was a poor study. He's doing better with his urination  Objective: Vital signs in last 24 hours: Temp:  [97.2 F (36.2 C)-98 F (36.7 C)] 97.2 F (36.2 C) (08/06 0601) Pulse Rate:  [80-102] 80 (08/06 0601) Resp:  [16-21] 20 (08/06 0601) BP: (91-112)/(55-66) 91/55 mmHg (08/06 0601) SpO2:  [92 %-96 %] 95 % (08/06 0724) Weight:  [67.586 kg (149 lb)] 67.586 kg (149 lb) (08/06 0603) Weight change: 4.082 kg (9 lb) Last BM Date: 08/17/13  Intake/Output from previous day: 08/05 0701 - 08/06 0700 In: 1800 [P.O.:1800] Out: 1400 [Urine:1400]  PHYSICAL EXAM General appearance: alert, cooperative and no distress Resp: Decreased breath sounds bilaterally Cardio: regular rate and rhythm, S1, S2 normal, no murmur, click, rub or gallop GI: soft, non-tender; bowel sounds normal; no masses,  no organomegaly Extremities: extremities normal, atraumatic, no cyanosis or edema  Lab Results:  Results for orders placed during the hospital encounter of 08/18/13 (from the past 48 hour(s))  CBC WITH DIFFERENTIAL     Status: Abnormal   Collection Time    08/18/13 10:56 AM      Result Value Ref Range   WBC 9.9  4.0 - 10.5 K/uL   RBC 4.96  4.22 - 5.81 MIL/uL   Hemoglobin 15.4  13.0 - 17.0 g/dL   HCT 45.8  39.0 - 52.0 %   MCV 92.3  78.0 - 100.0 fL   MCH 31.0  26.0 - 34.0 pg   MCHC 33.6  30.0 - 36.0 g/dL   RDW 13.4  11.5 - 15.5 %   Platelets 192  150 - 400 K/uL   Neutrophils Relative % 72  43 - 77 %   Neutro Abs 7.1  1.7 - 7.7 K/uL   Lymphocytes Relative 16  12 - 46 %   Lymphs Abs 1.6  0.7 - 4.0 K/uL   Monocytes Relative 11  3 - 12 %   Monocytes Absolute 1.1 (*) 0.1 - 1.0 K/uL   Eosinophils Relative 0  0 - 5 %   Eosinophils Absolute 0.0  0.0 - 0.7 K/uL   Basophils Relative 0  0 - 1 %   Basophils Absolute 0.0  0.0 - 0.1 K/uL  BASIC METABOLIC PANEL     Status: Abnormal   Collection  Time    08/18/13 10:56 AM      Result Value Ref Range   Sodium 140  137 - 147 mEq/[name]   Potassium 4.3  3.7 - 5.3 mEq/[name]   Chloride 100  96 - 112 mEq/[name]   CO2 28  19 - 32 mEq/[name]   Glucose, Bld 110 (*) 70 - 99 mg/dL   BUN 13  6 - 23 mg/dL   Creatinine, Ser 1.07  0.50 - 1.35 mg/dL   Calcium 9.1  8.4 - 10.5 mg/dL   GFR calc non Af Amer 67 (*) >90 mL/min   GFR calc Af Amer 77 (*) >90 mL/min   Comment: (NOTE)     The eGFR has been calculated using the CKD EPI equation.     This calculation has not been validated in all clinical situations.     eGFR's persistently <90 mL/min signify possible Chronic Kidney     Disease.   Anion gap 12  5 - 15  TROPONIN I     Status: None   Collection Time    08/18/13 10:56 AM  Result Value Ref Range   Troponin I <0.30  <0.30 ng/mL   Comment:            Due to the release kinetics of cTnI,     a negative result within the first hours     of the onset of symptoms does not rule out     myocardial infarction with certainty.     If myocardial infarction is still suspected,     repeat the test at appropriate intervals.  PRO B NATRIURETIC PEPTIDE     Status: Abnormal   Collection Time    08/18/13 10:56 AM      Result Value Ref Range   Pro B Natriuretic peptide (BNP) 1078.0 (*) 0 - 125 pg/mL  HEPATIC FUNCTION PANEL     Status: Abnormal   Collection Time    08/18/13 10:56 AM      Result Value Ref Range   Total Protein 7.3  6.0 - 8.3 g/dL   Albumin 3.4 (*) 3.5 - 5.2 g/dL   AST 13  0 - 37 U/[name]   ALT 10  0 - 53 U/[name]   Alkaline Phosphatase 113  39 - 117 U/[name]   Total Bilirubin 0.7  0.3 - 1.2 mg/dL   Bilirubin, Direct 0.2  0.0 - 0.3 mg/dL   Indirect Bilirubin 0.5  0.3 - 0.9 mg/dL  BLOOD GAS, ARTERIAL     Status: Abnormal   Collection Time    08/18/13  1:35 PM      Result Value Ref Range   O2 Content 3.0     Delivery systems NASAL CANNULA     pH, Arterial 7.324 (*) 7.350 - 7.450   pCO2 arterial 51.8 (*) 35.0 - 45.0 mmHg   pO2, Arterial 54.1 (*) 80.0 -  100.0 mmHg   Bicarbonate 26.1 (*) 20.0 - 24.0 mEq/[name]   TCO2 23.2  0 - 100 mmol/[name]   Acid-Base Excess 0.8  0.0 - 2.0 mmol/[name]   O2 Saturation 87.5     Patient temperature 37.0     Collection site RIGHT RADIAL     Drawn by [phone]     Sample type ARTERIAL     Allens test (pass/fail) PASS  PASS  MRSA PCR SCREENING     Status: Abnormal   Collection Time    08/18/13  4:00 PM      Result Value Ref Range   MRSA by PCR POSITIVE (*) NEGATIVE   Comment:            The GeneXpert MRSA Assay (FDA     approved for NASAL specimens     only), is one component of a     comprehensive MRSA colonization     surveillance program. It is not     intended to diagnose MRSA     infection nor to guide or     monitor treatment for     MRSA infections.     RESULT CALLED TO, READ BACK BY AND VERIFIED WITH:     DANIELS J AT 2018 ON [id number] BY FORSYTH K  TROPONIN I     Status: None   Collection Time    08/18/13  4:00 PM      Result Value Ref Range   Troponin I <0.30  <0.30 ng/mL   Comment:            Due to the release kinetics of cTnI,     a negative result within the first hours  of the onset of symptoms does not rule out     myocardial infarction with certainty.     If myocardial infarction is still suspected,     repeat the test at appropriate intervals.  TROPONIN I     Status: None   Collection Time    08/18/13  9:28 PM      Result Value Ref Range   Troponin I <0.30  <0.30 ng/mL   Comment:            Due to the release kinetics of cTnI,     a negative result within the first hours     of the onset of symptoms does not rule out     myocardial infarction with certainty.     If myocardial infarction is still suspected,     repeat the test at appropriate intervals.  TROPONIN I     Status: None   Collection Time    08/19/13  4:12 AM      Result Value Ref Range   Troponin I <0.30  <0.30 ng/mL   Comment:            Due to the release kinetics of cTnI,     a negative result within the first hours      of the onset of symptoms does not rule out     myocardial infarction with certainty.     If myocardial infarction is still suspected,     repeat the test at appropriate intervals.  BASIC METABOLIC PANEL     Status: Abnormal   Collection Time    08/19/13  4:12 AM      Result Value Ref Range   Sodium 141  137 - 147 mEq/[name]   Potassium 3.8  3.7 - 5.3 mEq/[name]   Chloride 101  96 - 112 mEq/[name]   CO2 28  19 - 32 mEq/[name]   Glucose, Bld 143 (*) 70 - 99 mg/dL   BUN 24 (*) 6 - 23 mg/dL   Comment: DELTA CHECK NOTED   Creatinine, Ser 1.16  0.50 - 1.35 mg/dL   Calcium 9.2  8.4 - 10.5 mg/dL   GFR calc non Af Amer 61 (*) >90 mL/min   GFR calc Af Amer 70 (*) >90 mL/min   Comment: (NOTE)     The eGFR has been calculated using the CKD EPI equation.     This calculation has not been validated in all clinical situations.     eGFR's persistently <90 mL/min signify possible Chronic Kidney     Disease.   Anion gap 12  5 - 15  CBC     Status: None   Collection Time    08/19/13  4:12 AM      Result Value Ref Range   WBC 6.9  4.0 - 10.5 K/uL   RBC 4.92  4.22 - 5.81 MIL/uL   Hemoglobin 15.1  13.0 - 17.0 g/dL   HCT 44.7  39.0 - 52.0 %   MCV 90.9  78.0 - 100.0 fL   MCH 30.7  26.0 - 34.0 pg   MCHC 33.8  30.0 - 36.0 g/dL   RDW 13.2  11.5 - 15.5 %   Platelets 188  150 - 400 K/uL    ABGS  Recent Labs  08/18/13 1335  PHART 7.324*  PO2ART 54.1*  TCO2 23.2  HCO3 26.1*   CULTURES Recent Results (from the past 240 hour(s))  MRSA PCR SCREENING     Status: Abnormal  Collection Time    08/18/13  4:00 PM      Result Value Ref Range Status   MRSA by PCR POSITIVE (*) NEGATIVE Final   Comment:            The GeneXpert MRSA Assay (FDA     approved for NASAL specimens     only), is one component of a     comprehensive MRSA colonization     surveillance program. It is not     intended to diagnose MRSA     infection nor to guide or     monitor treatment for     MRSA infections.     RESULT CALLED TO,  READ BACK BY AND VERIFIED WITH:     DANIELS J AT 2018 ON [id number] BY FORSYTH K   Studies/Results: Ct Angio Chest Pe W/cm &/or Wo Cm  08/18/2013   CLINICAL DATA:  Shortness of breath, hypoxia, dyspnea on exertion, fevers, chills, fatigue, evaluate for pulmonary embolism.  EXAM: CT ANGIOGRAPHY CHEST WITH CONTRAST  TECHNIQUE: Multidetector CT imaging of the chest was performed using the standard protocol during bolus administration of intravenous contrast. Multiplanar CT image reconstructions and MIPs were obtained to evaluate the vascular anatomy.  CONTRAST:  138m OMNIPAQUE IOHEXOL 350 MG/ML SOLN  COMPARISON:  04/04/2011; chest radiograph - earlier same day  FINDINGS: Vascular Findings:  There is adequate opacification of the pulmonary arterial system with the main pulmonary artery measuring 370 Hounsfield units. There are no discrete filling defects within the pulmonary arterial tree to suggest pulmonary embolism. Normal caliber the main pulmonary artery.  Normal heart size. Trace amount of pericardial fluid, presumably physiologic.  Scattered mixed calcified and noncalcified atherosclerotic plaque within a normal caliber thoracic aorta. Conventional configuration of the aortic arch. The major branch vessels of the aortic arch are patent throughout their imaged course. There is a minimal amount of eccentric mixed calcified and noncalcified atherosclerotic plaque within the left subclavian artery, not definitively resulting in a hemodynamically significant stenosis. No definite thoracic aortic dissection or periaortic stranding.  Review of the MIP images confirms the above findings.   ----------------------------------------------------------------------------------  Nonvascular Findings:  Evaluation of the pulmonary parenchyma is degraded secondary to patient respiratory artifact.  There is an unchanged approximately 4.2 x 3.3 cm bulla within the right lung apex (image 10, series 5). Advanced emphysematous change.  No pneumothorax. There is mild rather diffuse bronchial wall thickening of though the central pulmonary airways remain widely patent. No pleural effusion.  There is minimal subsegmental atelectasis primarily within the dependent portion of the right lower lobe. No discrete focal airspace opacities. No pleural effusion. No discrete pulmonary nodules given the limitations of the examination. Bilateral hilar lymph nodes are not enlarged by size criteria with index right suprahilar node measuring approximately 0.8 cm in greatest short axis diameter (image 42, series 4). No mediastinal, hilar axillary lymphadenopathy.  Limited early arterial phase evaluation of the upper abdomen demonstrates an approximately 4.6 x 3.8 cm hypo attenuating (1 Hounsfield unit) cyst within the dome of the right lobe of the liver. An ingested radiopaque pill fragment is noted within the gastric fundus.  No acute osseus abnormalities.  Regional soft tissues appear normal. Normal appearance of the thyroid gland.  IMPRESSION: 1. No acute cardiopulmonary disease. Specifically, no evidence of pulmonary embolism. 2. Similar findings of advanced emphysematous and bronchitic change.   Electronically Signed   By: JSandi MariscalM.D.   On: 08/18/2013 17:29   Dg Chest Portable 1 View  08/18/2013   CLINICAL DATA:  Shortness of breath.  EXAM: PORTABLE CHEST - 1 VIEW  COMPARISON:  One-view chest 04/07/2011  FINDINGS: The heart size is normal. Mild pulmonary vascular congestion is evident. Changes of COPD are again noted. No focal airspace disease is present. The visualized soft tissues and bony thorax are unremarkable.  IMPRESSION: 1. Mild pulmonary vascular congestion since prior exam suggests early congestive heart failure. 2. Stable changes of COPD. 3. Atherosclerosis.   Electronically Signed   By: Lawrence Santiago M.D.   On: 08/18/2013 11:41    Medications:  Prior to Admission:  Prescriptions prior to admission  Medication Sig Dispense Refill  .  albuterol (PROAIR HFA) 108 (90 BASE) MCG/ACT inhaler INHALE 2 PUFFS BY MOUTH EVERY 4 HOURS AS NEEDED FOR WHEEZING  8.5 g  3  . albuterol (PROVENTIL HFA;VENTOLIN HFA) 108 (90 BASE) MCG/ACT inhaler Inhale 2 puffs into the lungs every 6 (six) hours as needed for wheezing or shortness of breath.      . ALPRAZolam (XANAX) 0.25 MG tablet take 1 tablet by mouth three times a day if needed FOR ANXIETY  90 tablet  2  . amLODipine (NORVASC) 10 MG tablet take 1 tablet by mouth once daily  30 tablet  5  . Ascorbic Acid (VITAMIN C WITH ROSE HIPS) 500 MG tablet Take 500 mg by mouth daily.      Marland Kitchen atorvastatin (LIPITOR) 40 MG tablet take 1 tablet by mouth once daily  30 tablet  6  . budesonide-formoterol (SYMBICORT) 160-4.5 MCG/ACT inhaler inhale 2 puffs by mouth every 12 hours  10.2 g  6  . cholecalciferol (VITAMIN D) 1000 UNITS tablet Take 1,000 Units by mouth daily.      Marland Kitchen FLUoxetine (PROZAC) 20 MG capsule Take 1 capsule (20 mg total) by mouth daily. For your nerves  30 capsule  6  . ibuprofen (ADVIL,MOTRIN) 200 MG tablet Take 200 mg by mouth 3 (three) times daily as needed. For pain      . tiotropium (SPIRIVA HANDIHALER) 18 MCG inhalation capsule inhale the contents of one capsule in the handihaler once daily  30 capsule  6  . vitamin A 8000 UNIT capsule Take 8,000 Units by mouth daily.      . vitamin B-12 (CYANOCOBALAMIN) 1000 MCG tablet Take 1,000 mcg by mouth daily.      . nitroGLYCERIN (NITROSTAT) 0.4 MG SL tablet place 1 tablet under the tongue if needed every 5 minutes for chest pain for 3 doses IF NO RELIEF AFTER 3RD DOSE CALL PRESCRIBER OR 911.  25 tablet  6   Scheduled: . amLODipine  5 mg Oral Daily  . atorvastatin  10 mg Oral q1800  . Chlorhexidine Gluconate Cloth  6 each Topical Q0600  . cholecalciferol  1,000 Units Oral Daily  . enoxaparin (LOVENOX) injection  40 mg Subcutaneous Q24H  . FLUoxetine  20 mg Oral Daily  . ipratropium-albuterol  3 mL Nebulization BID  . levofloxacin (LEVAQUIN) IV   750 mg Intravenous Q24H  . methylPREDNISolone (SOLU-MEDROL) injection  60 mg Intravenous Q6H  . mupirocin ointment  1 application Nasal BID  . sodium chloride  3 mL Intravenous Q12H  . vitamin B-12  1,000 mcg Oral Daily  . vitamin C with rose hips  500 mg Oral Daily   Continuous:  [id number], acetaminophen, albuterol, ALPRAZolam, ondansetron (ZOFRAN) IV, ondansetron  Assesment: He was admitted with COPD exacerbation and acute respiratory failure. He seems to be doing better. His breathing has improved.  Principal Problem:   Acute respiratory failure with hypercapnia Active Problems:   COPD (chronic obstructive pulmonary disease)   Essential hypertension, benign   COPD exacerbation   Urinary retention   Tobacco dependence in remission   Chronic respiratory failure with hypoxia    Plan: Continue current treatments    LOS: 2 days   [name] 08/20/2013, 8:55 AM

## 2013-08-20 NOTE — Progress Notes (Signed)
Patient discharged home with instructions given on medications,and follow up visits,patient,and family verbalized understanding. Prescriptions sent with patient. Accompanied by staff to an awaiting vehicle.. 

## 2013-08-21 ENCOUNTER — Telehealth: Payer: Self-pay | Admitting: *Deleted

## 2013-08-21 NOTE — Telephone Encounter (Signed)
Received fax from [facility] care mgmt on this pt stating that pt has been discharged from [facility] on 08/20/2013 d/c to home, self care for Emphysema. MD made aware.

## 2013-08-27 ENCOUNTER — Ambulatory Visit (INDEPENDENT_AMBULATORY_CARE_PROVIDER_SITE_OTHER): Payer: Medicare HMO | Admitting: Family Medicine

## 2013-08-27 ENCOUNTER — Encounter: Payer: Self-pay | Admitting: Family Medicine

## 2013-08-27 VITALS — BP 132/68 | HR 100 | Temp 98.9°F | Resp 18 | Ht 66.0 in | Wt 145.0 lb

## 2013-08-27 DIAGNOSIS — J418 Mixed simple and mucopurulent chronic bronchitis: Secondary | ICD-10-CM

## 2013-08-27 DIAGNOSIS — I10 Essential (primary) hypertension: Secondary | ICD-10-CM

## 2013-08-27 DIAGNOSIS — F17200 Nicotine dependence, unspecified, uncomplicated: Secondary | ICD-10-CM

## 2013-08-27 DIAGNOSIS — Z720 Tobacco use: Secondary | ICD-10-CM

## 2013-08-27 DIAGNOSIS — J411 Mucopurulent chronic bronchitis: Secondary | ICD-10-CM

## 2013-08-27 MED ORDER — LORATADINE 10 MG PO TABS: 10.0000 mg | ORAL_TABLET | Freq: Every day | ORAL | Status: DC

## 2013-08-27 NOTE — Assessment & Plan Note (Signed)
Well controlled 

## 2013-08-27 NOTE — Patient Instructions (Signed)
Use your oxygen on regular basis Sinus pill given Continue all other medications F/U as previous

## 2013-08-27 NOTE — Assessment & Plan Note (Signed)
unchanged

## 2013-08-27 NOTE — Progress Notes (Signed)
Patient ID: [name], male   DOB: [birth year], 73 y.o.   MRN: [id number]   Subjective:    Patient ID: [name], male    DOB: 11/21/1940, 73 y.o.   MRN: [id number]  Patient presents for Hospital F/U Admitted with acute on chronic respiratory failure in setting of COPD exacerbation, has not been very compliant with his oxygen therapy, using inhalers per report, still smokes. Feels great today, no CP, no change in breathing, still has some cough at end of prednisone taper. No concern for CHF, Echo showed mild diastolic dysfunction otherwise normal. Labs reviewed Meds reviewed    Review Of Systems:  GEN- denies fatigue, fever, weight loss,weakness, recent illness HEENT- denies eye drainage, change in vision, nasal discharge, CVS- denies chest pain, palpitations RESP- denies SOB, +cough, +wheeze ABD- denies N/V, change in stools, abd pain GU- denies dysuria, hematuria, dribbling, incontinence MSK- denies joint pain, muscle aches, injury Neuro- denies headache, dizziness, syncope, seizure activity       Objective:    BP 132/68  Pulse 100  Temp(Src) 98.9 F (37.2 C) (Oral)  Resp 18  Ht 5\' 6"  (1.676 m)  Wt 145 lb (65.772 kg)  BMI 23.41 kg/m2  SpO2 95% GEN- NAD, alert and oriented x3 HEENT- PERRL, EOMI, non injected sclera, pink conjunctiva, MMM, oropharynx clear Neck- Supple, no LAD CVS- RRR, no murmur RESP-fair air movement, walking sat 93% without oxygen, few scattered wheeze, no rales, normal WOB at rest ABD-NABS,soft,NT,ND EXT- No edema Pulses- Radial 2+        Assessment & Plan:      Problem List Items Addressed This Visit   None      Note: This dictation was prepared with Dragon dictation along with smaller phrase technology. Any transcriptional errors that result from this process are unintentional.

## 2013-08-27 NOTE — Assessment & Plan Note (Signed)
Back at baseline , discussed importance of Oxygen therapy and medication compliance F/u pulmonary

## 2013-09-08 ENCOUNTER — Encounter: Payer: Self-pay | Admitting: Family Medicine

## 2013-09-14 ENCOUNTER — Other Ambulatory Visit: Payer: Self-pay | Admitting: *Deleted

## 2013-09-14 MED ORDER — BUDESONIDE-FORMOTEROL FUMARATE 160-4.5 MCG/ACT IN AERO: INHALATION_SPRAY | RESPIRATORY_TRACT | Status: DC

## 2013-09-14 NOTE — Telephone Encounter (Signed)
Received fax requesting refill on Symbicort.   Refill appropriate and filled per protocol. 

## 2013-09-15 ENCOUNTER — Telehealth: Payer: Self-pay | Admitting: Family Medicine

## 2013-09-15 NOTE — Telephone Encounter (Signed)
Received return call from patient wife, [name].   Reports that she would like prescription to be sent to [facility].   Prescription faxed.

## 2013-09-15 NOTE — Telephone Encounter (Signed)
[phone]   Pt wife is stating that Mcarthur Rossetti is wanting him to get a Nebulizer and the medicine to go with it.

## 2013-09-15 NOTE — Telephone Encounter (Signed)
Okay to write script for nebulizer ,, albuterol 2.5mg  neb q 4 hours prn  30 day supply  Dx COPD

## 2013-09-15 NOTE — Telephone Encounter (Signed)
Prescription written out.   Call placed to patient to find out where prescription should be sent to.   [place].

## 2013-09-15 NOTE — Telephone Encounter (Signed)
MD please advise

## 2013-09-22 ENCOUNTER — Telehealth: Payer: Self-pay | Admitting: Family Medicine

## 2013-09-22 NOTE — Telephone Encounter (Signed)
Call placed to patient.   Left message on VM to go to ER with symptoms of chest tightness and chest pain.

## 2013-09-22 NOTE — Telephone Encounter (Signed)
[phone]  Pt family is in office and the son states that the Pt states that he is having chest tightness and chest pain. I advised him to go to ER and he said he wanted [place] to call him can you please contact the pt

## 2013-10-06 ENCOUNTER — Telehealth: Payer: Self-pay | Admitting: *Deleted

## 2013-10-06 NOTE — Telephone Encounter (Signed)
Received call from patient wife, [name].   Reports that patient is running out of oxygen tanks early in the month, but [employer] requires new prescription to bring out more oxygen tanks.  MD made aware and advised to have patient contact pulmonologist. Also advised portable concentrator.

## 2013-10-22 ENCOUNTER — Other Ambulatory Visit: Payer: Self-pay | Admitting: *Deleted

## 2013-10-22 MED ORDER — AMLODIPINE BESYLATE 10 MG PO TABS: ORAL_TABLET | ORAL | Status: DC

## 2013-10-22 NOTE — Telephone Encounter (Signed)
Received fax requesting refill on Norvasc.   Refill appropriate and filled per protocol.  

## 2013-10-29 ENCOUNTER — Telehealth: Payer: Self-pay | Admitting: *Deleted

## 2013-10-29 NOTE — Telephone Encounter (Signed)
Received fax requesting refill on Xanax.   Ok to refill??  Last office visit 08/27/2013.  Last refill 07/21/2013, #2 refills.

## 2013-10-30 MED ORDER — ALPRAZOLAM 0.25 MG PO TABS: ORAL_TABLET | ORAL | Status: DC

## 2013-10-30 NOTE — Telephone Encounter (Signed)
Medication called to pharmacy. 

## 2013-10-30 NOTE — Telephone Encounter (Signed)
Okay to refill? 

## 2013-11-30 ENCOUNTER — Encounter: Payer: Self-pay | Admitting: Family Medicine

## 2013-11-30 ENCOUNTER — Ambulatory Visit (INDEPENDENT_AMBULATORY_CARE_PROVIDER_SITE_OTHER): Payer: Medicare HMO | Admitting: Family Medicine

## 2013-11-30 VITALS — BP 110/60 | HR 92 | Temp 98.7°F | Resp 20 | Ht 67.0 in | Wt 147.0 lb

## 2013-11-30 DIAGNOSIS — I10 Essential (primary) hypertension: Secondary | ICD-10-CM

## 2013-11-30 DIAGNOSIS — Z720 Tobacco use: Secondary | ICD-10-CM

## 2013-11-30 DIAGNOSIS — E785 Hyperlipidemia, unspecified: Secondary | ICD-10-CM

## 2013-11-30 DIAGNOSIS — J418 Mixed simple and mucopurulent chronic bronchitis: Secondary | ICD-10-CM

## 2013-11-30 DIAGNOSIS — F419 Anxiety disorder, unspecified: Secondary | ICD-10-CM

## 2013-11-30 MED ORDER — FLUOXETINE HCL 40 MG PO CAPS: 40.0000 mg | ORAL_CAPSULE | Freq: Every day | ORAL | Status: DC

## 2013-11-30 MED ORDER — ALPRAZOLAM 0.25 MG PO TABS: ORAL_TABLET | ORAL | Status: DC

## 2013-11-30 NOTE — Patient Instructions (Signed)
We will call about your oxygen tank  Prozac increased to 40mg  once a day Continue xanax We will call with lab results F/U 4 months

## 2013-11-30 NOTE — Assessment & Plan Note (Signed)
Recheck labs, fasting past 6 hours, on lipitor

## 2013-11-30 NOTE — Assessment & Plan Note (Signed)
Well controlled 

## 2013-11-30 NOTE — Assessment & Plan Note (Signed)
Increase prozac to 40mg  once a day , continue xanax

## 2013-11-30 NOTE — Assessment & Plan Note (Signed)
Currently stable, declines flu, will check into another portable oxygen for him

## 2013-11-30 NOTE — Progress Notes (Signed)
Patient ID: [name], male   DOB: 09-Mar-1940, 73 y.o.   MRN: [id number]   Subjective:    Patient ID: [name], male    DOB: 04/07/1940, 73 y.o.   MRN: [id number]  Patient presents for 4 Month med check patient here to follow medications. He is no specific concerns they state that his nerves are still bad and he did take Xanax twice a day. He is taking his Prozac 20 mg as described. He is sleeping fairly well.  He declines a flu shot. He is describing using his inhalers. He does state that he does not like the liquid portable oxygen that he has and that he would like a different concentrator    Review Of Systems:  GEN- denies fatigue, fever, weight loss,weakness, recent illness HEENT- denies eye drainage, change in vision, nasal discharge, CVS- denies chest pain, palpitations RESP- denies SOB, cough, wheeze ABD- denies N/V, change in stools, abd pain GU- denies dysuria, hematuria, dribbling, incontinence MSK- denies joint pain, muscle aches, injury Neuro- denies headache, dizziness, syncope, seizure activity       Objective:    BP 110/60 mmHg  Pulse 92  Temp(Src) 98.7 F (37.1 C) (Oral)  Resp 20  Ht 5\' 7"  (1.702 m)  Wt 147 lb (66.679 kg)  BMI 23.02 kg/m2  SpO2 98% GEN- NAD, alert and oriented x3 HEENT- PERRL, EOMI, non injected sclera, pink conjunctiva, MMM, oropharynx clear CVS- RRR, no murmur RESP-decreased BS bases, good air movement, occ wheeze Psych- normal affect and mood, no SI EXT- No edema Pulses- Radial 2+        Assessment & Plan:      Problem List Items Addressed This Visit    Tobacco abuse   Hyperlipidemia - Primary   Relevant Orders      Comprehensive metabolic panel      Lipid panel   COPD (chronic obstructive pulmonary disease)   Anxiety   Relevant Medications      FLUoxetine (PROZAC) capsule      ALPRAZolam  Duanne Moron) tablet      Note: This dictation was prepared with Dragon dictation along with smaller phrase technology. Any  transcriptional errors that result from this process are unintentional.

## 2013-12-01 ENCOUNTER — Telehealth: Payer: Self-pay | Admitting: *Deleted

## 2013-12-01 LAB — COMPREHENSIVE METABOLIC PANEL
ALT: 14 U/L (ref 0–53)
AST: 16 U/L (ref 0–37)
Albumin: 4.3 g/dL (ref 3.5–5.2)
Alkaline Phosphatase: 122 U/L — ABNORMAL HIGH (ref 39–117)
BUN: 14 mg/dL (ref 6–23)
CHLORIDE: 103 meq/L (ref 96–112)
CO2: 26 meq/L (ref 19–32)
Calcium: 9.3 mg/dL (ref 8.4–10.5)
Creat: 1.09 mg/dL (ref 0.50–1.35)
Glucose, Bld: 94 mg/dL (ref 70–99)
POTASSIUM: 4.5 meq/L (ref 3.5–5.3)
Sodium: 144 mEq/L (ref 135–145)
Total Bilirubin: 0.5 mg/dL (ref 0.2–1.2)
Total Protein: 7 g/dL (ref 6.0–8.3)

## 2013-12-01 LAB — LIPID PANEL
CHOLESTEROL: 177 mg/dL (ref 0–200)
HDL: 36 mg/dL — ABNORMAL LOW (ref >39)
LDL Cholesterol: 119 mg/dL — ABNORMAL HIGH (ref 0–99)
Total CHOL/HDL Ratio: 4.9 Ratio
Triglycerides: 111 mg/dL (ref <150)
VLDL: 22 mg/dL (ref 0–40)

## 2013-12-01 NOTE — Telephone Encounter (Signed)
Noted, nothing else needs to be changed, continue his current meds and use his oxygen

## 2013-12-01 NOTE — Telephone Encounter (Signed)
Received call from patient.   Reports that he was seen for OV on 11/30/2013, but he forgot to mention fatigue.   States that he is sleeping during the night, and denies nocturnal wakening. Reports that during the day he is noted to be really tired and requires naps.  MD please advise.

## 2013-12-02 ENCOUNTER — Other Ambulatory Visit: Payer: Self-pay | Admitting: *Deleted

## 2013-12-02 MED ORDER — ALBUTEROL SULFATE HFA 108 (90 BASE) MCG/ACT IN AERS: INHALATION_SPRAY | RESPIRATORY_TRACT | Status: DC

## 2013-12-02 NOTE — Telephone Encounter (Signed)
Received fax requesting refill on Albuterol Inhaler.   Refill appropriate and filled per protocol.

## 2013-12-21 ENCOUNTER — Telehealth: Payer: Self-pay | Admitting: *Deleted

## 2013-12-21 MED ORDER — IVERMECTIN 0.5 % EX LOTN: TOPICAL_LOTION | CUTANEOUS | Status: DC

## 2013-12-21 NOTE — Telephone Encounter (Signed)
Received call from patient wife.   Reports that patient requires prescription for lice shampoo.   Prescription sent to pharmacy.

## 2014-02-07 DIAGNOSIS — J449 Chronic obstructive pulmonary disease, unspecified: Secondary | ICD-10-CM | POA: Diagnosis not present

## 2014-02-17 ENCOUNTER — Other Ambulatory Visit: Payer: Self-pay | Admitting: *Deleted

## 2014-02-17 MED ORDER — TIOTROPIUM BROMIDE MONOHYDRATE 18 MCG IN CAPS: ORAL_CAPSULE | RESPIRATORY_TRACT | Status: DC

## 2014-02-17 MED ORDER — ALBUTEROL SULFATE HFA 108 (90 BASE) MCG/ACT IN AERS: INHALATION_SPRAY | RESPIRATORY_TRACT | Status: DC

## 2014-02-17 NOTE — Telephone Encounter (Signed)
Received fax requesting refill on Spiriva and ProAir.   Refill appropriate and filled per protocol.

## 2014-03-02 ENCOUNTER — Ambulatory Visit: Payer: Medicare HMO | Admitting: Family Medicine

## 2014-03-02 ENCOUNTER — Other Ambulatory Visit: Payer: Self-pay | Admitting: Family Medicine

## 2014-03-02 DIAGNOSIS — J449 Chronic obstructive pulmonary disease, unspecified: Secondary | ICD-10-CM | POA: Diagnosis not present

## 2014-03-02 NOTE — Telephone Encounter (Signed)
LRF 11/30/13  #90 + 2  LOV same  OK refill?

## 2014-03-02 NOTE — Telephone Encounter (Signed)
Okay to refill? 

## 2014-03-04 ENCOUNTER — Telehealth: Payer: Self-pay | Admitting: *Deleted

## 2014-03-04 NOTE — Telephone Encounter (Signed)
Received fax requesting refill on Xanax.   Ok to refill??  Last office visit 11/30/2013.  Last refill 02/17/2014.

## 2014-03-04 NOTE — Telephone Encounter (Signed)
Okay to refill? 

## 2014-03-05 MED ORDER — ALPRAZOLAM 0.25 MG PO TABS: ORAL_TABLET | ORAL | Status: DC

## 2014-03-05 NOTE — Telephone Encounter (Signed)
Medication called to pharmacy. 

## 2014-03-10 DIAGNOSIS — J449 Chronic obstructive pulmonary disease, unspecified: Secondary | ICD-10-CM | POA: Diagnosis not present

## 2014-03-12 ENCOUNTER — Ambulatory Visit: Payer: Medicare HMO | Admitting: Family Medicine

## 2014-03-31 ENCOUNTER — Encounter: Payer: Self-pay | Admitting: Family Medicine

## 2014-03-31 ENCOUNTER — Ambulatory Visit (INDEPENDENT_AMBULATORY_CARE_PROVIDER_SITE_OTHER): Payer: Commercial Managed Care - HMO | Admitting: Family Medicine

## 2014-03-31 VITALS — BP 120/68 | HR 92 | Temp 97.9°F | Resp 18 | Wt 142.0 lb

## 2014-03-31 DIAGNOSIS — F419 Anxiety disorder, unspecified: Secondary | ICD-10-CM | POA: Diagnosis not present

## 2014-03-31 DIAGNOSIS — J418 Mixed simple and mucopurulent chronic bronchitis: Secondary | ICD-10-CM

## 2014-03-31 DIAGNOSIS — I10 Essential (primary) hypertension: Secondary | ICD-10-CM

## 2014-03-31 DIAGNOSIS — J441 Chronic obstructive pulmonary disease with (acute) exacerbation: Secondary | ICD-10-CM | POA: Diagnosis not present

## 2014-03-31 DIAGNOSIS — E785 Hyperlipidemia, unspecified: Secondary | ICD-10-CM

## 2014-03-31 DIAGNOSIS — Z720 Tobacco use: Secondary | ICD-10-CM | POA: Diagnosis not present

## 2014-03-31 DIAGNOSIS — J449 Chronic obstructive pulmonary disease, unspecified: Secondary | ICD-10-CM | POA: Diagnosis not present

## 2014-03-31 MED ORDER — LEVOFLOXACIN 500 MG PO TABS: 500.0000 mg | ORAL_TABLET | Freq: Every day | ORAL | Status: DC

## 2014-03-31 MED ORDER — ALPRAZOLAM 1 MG PO TABS: ORAL_TABLET | ORAL | Status: DC

## 2014-03-31 MED ORDER — PREDNISONE 10 MG PO TABS: ORAL_TABLET | ORAL | Status: DC

## 2014-03-31 NOTE — Assessment & Plan Note (Signed)
COPD exacerbation. We'll treat with prednisone burst as well as Levaquin. He will continue his inhalers also add Mucinex

## 2014-03-31 NOTE — Progress Notes (Signed)
Patient ID: HIEU HERMS, male   DOB: 1941-01-13, 74 y.o.   MRN: [id number]   Subjective:    Patient ID: LANDO ALCALDE, male    DOB: August 12, 1940, 74 y.o.   MRN: [id number]  Patient presents for Follow-up  Patient here with increased shortness of breath wheezing cough for the past few days. Positive sick contact with his wife who is also sick with bronchitis. he is surrounded by tobacco smoke also has fireplace and other irritants in his household. He has been wearing his oxygen at least 3 L a day  Anxiety he states that his nerves are still bad he often takes 0.5 mg of Xanax 3 times a day along with his Prozac as this still does not help. There is a lot of family stress and trauma in his home he describes at social services was called out to the house because of hoarding of a lot of junk around the property and there were children on site. So he has been stressed out about this.     Review Of Systems:  GEN- denies fatigue, fever, weight loss,weakness, recent illness HEENT- denies eye drainage, change in vision, nasal discharge, CVS- denies chest pain, palpitations RESP- + SOB, +cough, +wheeze ABD- denies N/V, change in stools, abd pain GU- denies dysuria, hematuria, dribbling, incontinence MSK- denies joint pain, muscle aches, injury Neuro- denies headache, dizziness, syncope, seizure activity       Objective:    BP 120/68 mmHg  Pulse 92  Temp(Src) 97.9 F (36.6 C) (Oral)  Resp 18  Wt 142 lb (64.411 kg)  SpO2 97% GEN- NAD, alert and oriented x3,elderly male HEENT- PERRL, EOMI, non injected sclera, pink conjunctiva, MMM, oropharynx clear Neck- Supple, no LAD, no retractions CVS- RRR, no murmur RESP-bilat wheeze and rhonchi, right base, decreased air movement sat 95% 3L Psych- not anxious or depressed appearing, normal speech EXT- No edema Pulses- Radial,2+        Assessment & Plan:      Problem List Items Addressed This Visit    None      Note: This dictation  was prepared with Dragon dictation along with smaller phrase technology. Any transcriptional errors that result from this process are unintentional.

## 2014-03-31 NOTE — Patient Instructions (Signed)
Xanax increased to 1mg  Take antibiotics Take prednisone as prescribed Mucinex twice a day  We will call with lab results F/U 4 months

## 2014-03-31 NOTE — Assessment & Plan Note (Signed)
Blood pressure is well controlled and changed her medication 

## 2014-03-31 NOTE — Assessment & Plan Note (Signed)
Recheck cholesterol lab levels he is on Lipitor 40 mg

## 2014-03-31 NOTE — Assessment & Plan Note (Signed)
He will continue the Prozac 40 mg I will increase his Xanax to 1 mg he will try to use this only 2 times a day

## 2014-03-31 NOTE — Assessment & Plan Note (Signed)
He initially stated he quit smoking many then he states that he does take a few puffs off of the cigarette advised him that he does not need to be around any tobacco smoke

## 2014-04-01 LAB — CBC WITH DIFFERENTIAL/PLATELET
Basophils Absolute: 0.1 10*3/uL (ref 0.0–0.1)
Basophils Relative: 1 % (ref 0–1)
Eosinophils Absolute: 0.3 10*3/uL (ref 0.0–0.7)
Eosinophils Relative: 5 % (ref 0–5)
HCT: 49 % (ref 39.0–52.0)
HEMOGLOBIN: 16.6 g/dL (ref 13.0–17.0)
LYMPHS PCT: 19 % (ref 12–46)
Lymphs Abs: 1 10*3/uL (ref 0.7–4.0)
MCH: 30.3 pg (ref 26.0–34.0)
MCHC: 33.9 g/dL (ref 30.0–36.0)
MCV: 89.6 fL (ref 78.0–100.0)
MPV: 11.2 fL (ref 8.6–12.4)
Monocytes Absolute: 0.6 10*3/uL (ref 0.1–1.0)
Monocytes Relative: 10 % (ref 3–12)
Neutro Abs: 3.6 10*3/uL (ref 1.7–7.7)
Neutrophils Relative %: 65 % (ref 43–77)
Platelets: 351 10*3/uL (ref 150–400)
RBC: 5.47 MIL/uL (ref 4.22–5.81)
RDW: 14.7 % (ref 11.5–15.5)
WBC: 5.5 10*3/uL (ref 4.0–10.5)

## 2014-04-01 LAB — COMPREHENSIVE METABOLIC PANEL
ALBUMIN: 4.8 g/dL (ref 3.5–5.2)
ALT: 15 U/L (ref 0–53)
AST: 20 U/L (ref 0–37)
Alkaline Phosphatase: 124 U/L — ABNORMAL HIGH (ref 39–117)
BILIRUBIN TOTAL: 0.6 mg/dL (ref 0.2–1.2)
BUN: 11 mg/dL (ref 6–23)
CO2: 29 mEq/L (ref 19–32)
Calcium: 10 mg/dL (ref 8.4–10.5)
Chloride: 105 mEq/L (ref 96–112)
Creat: 1.07 mg/dL (ref 0.50–1.35)
GLUCOSE: 84 mg/dL (ref 70–99)
Potassium: 4.8 mEq/L (ref 3.5–5.3)
Sodium: 142 mEq/L (ref 135–145)
Total Protein: 7.4 g/dL (ref 6.0–8.3)

## 2014-04-01 LAB — LIPID PANEL
Cholesterol: 177 mg/dL (ref 0–200)
HDL: 34 mg/dL — ABNORMAL LOW (ref >=40)
LDL Cholesterol: 116 mg/dL — ABNORMAL HIGH (ref 0–99)
Total CHOL/HDL Ratio: 5.2 Ratio
Triglycerides: 137 mg/dL (ref <150)
VLDL: 27 mg/dL (ref 0–40)

## 2014-04-02 ENCOUNTER — Encounter: Payer: Self-pay | Admitting: *Deleted

## 2014-04-08 DIAGNOSIS — J449 Chronic obstructive pulmonary disease, unspecified: Secondary | ICD-10-CM | POA: Diagnosis not present

## 2014-04-12 ENCOUNTER — Other Ambulatory Visit: Payer: Self-pay | Admitting: *Deleted

## 2014-04-12 MED ORDER — BUDESONIDE-FORMOTEROL FUMARATE 160-4.5 MCG/ACT IN AERO: INHALATION_SPRAY | RESPIRATORY_TRACT | Status: DC

## 2014-04-12 NOTE — Telephone Encounter (Signed)
Received fax requesting refill on Symbicort.   Refill appropriate and filled per protocol. 

## 2014-04-19 ENCOUNTER — Emergency Department (HOSPITAL_COMMUNITY): Payer: Commercial Managed Care - HMO

## 2014-04-19 ENCOUNTER — Emergency Department (HOSPITAL_COMMUNITY)
Admission: EM | Admit: 2014-04-19 | Discharge: 2014-04-19 | Disposition: A | Discharge status: Home / Self Care | Payer: Commercial Managed Care - HMO | Attending: Emergency Medicine | Admitting: Emergency Medicine

## 2014-04-19 ENCOUNTER — Encounter (HOSPITAL_COMMUNITY): Payer: Self-pay

## 2014-04-19 ENCOUNTER — Telehealth: Payer: Self-pay | Admitting: *Deleted

## 2014-04-19 DIAGNOSIS — R5383 Other fatigue: Secondary | ICD-10-CM | POA: Diagnosis present

## 2014-04-19 DIAGNOSIS — M1990 Unspecified osteoarthritis, unspecified site: Secondary | ICD-10-CM | POA: Insufficient documentation

## 2014-04-19 DIAGNOSIS — R0602 Shortness of breath: Secondary | ICD-10-CM

## 2014-04-19 DIAGNOSIS — Z85828 Personal history of other malignant neoplasm of skin: Secondary | ICD-10-CM | POA: Diagnosis not present

## 2014-04-19 DIAGNOSIS — R531 Weakness: Secondary | ICD-10-CM | POA: Insufficient documentation

## 2014-04-19 DIAGNOSIS — K219 Gastro-esophageal reflux disease without esophagitis: Secondary | ICD-10-CM | POA: Insufficient documentation

## 2014-04-19 DIAGNOSIS — Z79899 Other long term (current) drug therapy: Secondary | ICD-10-CM | POA: Insufficient documentation

## 2014-04-19 DIAGNOSIS — I10 Essential (primary) hypertension: Secondary | ICD-10-CM | POA: Diagnosis not present

## 2014-04-19 DIAGNOSIS — Z87891 Personal history of nicotine dependence: Secondary | ICD-10-CM | POA: Insufficient documentation

## 2014-04-19 DIAGNOSIS — J441 Chronic obstructive pulmonary disease with (acute) exacerbation: Secondary | ICD-10-CM | POA: Insufficient documentation

## 2014-04-19 DIAGNOSIS — S0990XA Unspecified injury of head, initial encounter: Secondary | ICD-10-CM | POA: Diagnosis not present

## 2014-04-19 DIAGNOSIS — Z792 Long term (current) use of antibiotics: Secondary | ICD-10-CM | POA: Diagnosis not present

## 2014-04-19 DIAGNOSIS — R404 Transient alteration of awareness: Secondary | ICD-10-CM | POA: Diagnosis not present

## 2014-04-19 DIAGNOSIS — R296 Repeated falls: Secondary | ICD-10-CM | POA: Diagnosis not present

## 2014-04-19 LAB — COMPREHENSIVE METABOLIC PANEL WITH GFR
ALT: 14 U/L (ref 0–53)
AST: 16 U/L (ref 0–37)
Albumin: 3.3 g/dL — ABNORMAL LOW (ref 3.5–5.2)
Alkaline Phosphatase: 100 U/L (ref 39–117)
Anion gap: 7 (ref 5–15)
BUN: 10 mg/dL (ref 6–23)
CO2: 31 mmol/L (ref 19–32)
Calcium: 8.6 mg/dL (ref 8.4–10.5)
Chloride: 105 mmol/L (ref 96–112)
Creatinine, Ser: 1.06 mg/dL (ref 0.50–1.35)
GFR calc Af Amer: 78 mL/min — ABNORMAL LOW
GFR calc non Af Amer: 68 mL/min — ABNORMAL LOW
Glucose, Bld: 89 mg/dL (ref 70–99)
Potassium: 4.1 mmol/L (ref 3.5–5.1)
Sodium: 143 mmol/L (ref 135–145)
Total Bilirubin: 0.3 mg/dL (ref 0.3–1.2)
Total Protein: 6.4 g/dL (ref 6.0–8.3)

## 2014-04-19 LAB — CBC WITH DIFFERENTIAL/PLATELET
Basophils Absolute: 0 10*3/uL (ref 0.0–0.1)
Basophils Relative: 0 % (ref 0–1)
Eosinophils Absolute: 0.3 10*3/uL (ref 0.0–0.7)
Eosinophils Relative: 6 % — ABNORMAL HIGH (ref 0–5)
HCT: 41.3 % (ref 39.0–52.0)
Hemoglobin: 13.4 g/dL (ref 13.0–17.0)
Lymphocytes Relative: 16 % (ref 12–46)
Lymphs Abs: 0.7 10*3/uL (ref 0.7–4.0)
MCH: 29.9 pg (ref 26.0–34.0)
MCHC: 32.4 g/dL (ref 30.0–36.0)
MCV: 92.2 fL (ref 78.0–100.0)
Monocytes Absolute: 0.4 10*3/uL (ref 0.1–1.0)
Monocytes Relative: 9 % (ref 3–12)
Neutro Abs: 3.1 10*3/uL (ref 1.7–7.7)
Neutrophils Relative %: 69 % (ref 43–77)
Platelets: 187 10*3/uL (ref 150–400)
RBC: 4.48 MIL/uL (ref 4.22–5.81)
RDW: 14 % (ref 11.5–15.5)
WBC: 4.5 10*3/uL (ref 4.0–10.5)

## 2014-04-19 LAB — URINALYSIS, ROUTINE W REFLEX MICROSCOPIC
Bilirubin Urine: NEGATIVE
Glucose, UA: NEGATIVE mg/dL
Hgb urine dipstick: NEGATIVE
Ketones, ur: NEGATIVE mg/dL
Nitrite: NEGATIVE
PROTEIN: NEGATIVE mg/dL
Specific Gravity, Urine: 1.025 (ref 1.005–1.030)
Urobilinogen, UA: 0.2 mg/dL (ref 0.0–1.0)
pH: 5.5 (ref 5.0–8.0)

## 2014-04-19 LAB — LIPASE, BLOOD: LIPASE: 23 U/L (ref 11–59)

## 2014-04-19 LAB — URINE MICROSCOPIC-ADD ON

## 2014-04-19 MED ORDER — SODIUM CHLORIDE 0.9 % IV SOLN: INTRAVENOUS | Status: DC

## 2014-04-19 MED ORDER — SODIUM CHLORIDE 0.9 % IV BOLUS (SEPSIS)
250.0000 mL | Freq: Once | INTRAVENOUS | Status: AC
  Administered 2014-04-19: 250 mL via INTRAVENOUS

## 2014-04-19 NOTE — ED Provider Notes (Signed)
CSN: [id number]     Arrival date & time 04/19/14  1542 History   First MD Initiated Contact with Patient 04/19/14 1552     Chief Complaint  Patient presents with  . Fatigue     (Consider location/radiation/quality/duration/timing/severity/associated sxs/prior Treatment) The history is provided by the patient and a relative.   patient came in with the complaint of weakness. Able to ambulate with a cane. Has had some recent falls. Patient felt weak in the legs yesterday and then we call over today. No fever no headache no acute visual changes no chest pain just baseline shortness of breath for COPD. No abdominal pain. Patient stated no speech changes. Patient normally on 3 L nasal cannula oxygen.  Past Medical History  Diagnosis Date  . Hypertension   . Lung disease   . Chest pain   . GERD (gastroesophageal reflux disease)   . Hyperlipidemia   . Anxiety   . PVD (peripheral vascular disease)     RLE ABI 0.8  . Cancer     skin  . COPD (chronic obstructive pulmonary disease)     [facility]  . Arthritis    Past Surgical History  Procedure Laterality Date  . Cataract extraction      left eye   No family history on file. History  Substance Use Topics  . Smoking status: Former Smoker -- 1.00 packs/day    Types: Cigarettes, Cigars  . Smokeless tobacco: Current User    Types: Snuff  . Alcohol Use: No    Review of Systems  Constitutional: Negative for fever.  HENT: Negative for congestion.   Eyes: Negative for visual disturbance.  Respiratory: Positive for cough and shortness of breath.   Cardiovascular: Negative for chest pain.  Gastrointestinal: Negative for abdominal pain.  Genitourinary: Negative for dysuria.  Musculoskeletal: Negative for back pain.  Skin: Negative for rash.  Neurological: Positive for weakness. Negative for speech difficulty, numbness and headaches.  Hematological: Does not bruise/bleed easily.  Psychiatric/Behavioral: Negative for  confusion.      Allergies  Review of patient's allergies indicates no known allergies.  Home Medications   Prior to Admission medications   Medication Sig Start Date End Date Taking? Authorizing Provider  albuterol (PROAIR HFA) 108 (90 BASE) MCG/ACT inhaler INHALE 2 PUFFS BY MOUTH EVERY 4 HOURS AS NEEDED FOR WHEEZING Patient taking differently: Inhale 1-2 puffs into the lungs every 4 (four) hours as needed for wheezing or shortness of breath.  02/17/14  Yes Alycia Rossetti, MD  ALPRAZolam Duanne Moron) 1 MG tablet take 1 tablet by mouth three times a day if needed FOR ANXIETY Patient taking differently: Take 1 mg by mouth 3 (three) times daily as needed for anxiety. take 1 tablet by mouth three times a day if needed FOR ANXIETY 03/31/14  Yes Alycia Rossetti, MD  amLODipine (NORVASC) 10 MG tablet take 1 tablet by mouth once daily Patient taking differently: Take 10 mg by mouth daily. take 1 tablet by mouth once daily 10/22/13  Yes Alycia Rossetti, MD  Ascorbic Acid (VITAMIN C WITH ROSE HIPS) 500 MG tablet Take 500 mg by mouth daily.   Yes Historical Provider, MD  atorvastatin (LIPITOR) 40 MG tablet take 1 tablet by mouth once daily Patient taking differently: Take 40 mg by mouth daily. take 1 tablet by mouth once daily 07/31/13  Yes Alycia Rossetti, MD  budesonide-formoterol [facility]) 160-4.5 MCG/ACT inhaler inhale 2 puffs by mouth every 12 hours 04/12/14  Yes  Alycia Rossetti, MD  cholecalciferol (VITAMIN D) 1000 UNITS tablet Take 1,000 Units by mouth daily.   Yes Historical Provider, MD  FLUoxetine (PROZAC) 40 MG capsule Take 1 capsule (40 mg total) by mouth daily. For your nerves 11/30/13  Yes Alycia Rossetti, MD  ibuprofen (ADVIL,MOTRIN) 200 MG tablet Take 200 mg by mouth 3 (three) times daily as needed. For pain   Yes Historical Provider, MD  Ivermectin 0.5 % LOTN Apply to scalp and hair at bedtime. Rinse off in the morning. 12/21/13  Yes Alycia Rossetti, MD  nitroGLYCERIN (NITROSTAT) 0.4 MG  SL tablet place 1 tablet under the tongue if needed every 5 minutes for chest pain for 3 doses IF NO RELIEF AFTER 3RD DOSE CALL PRESCRIBER OR 911. 11/03/12  Yes Alycia Rossetti, MD  tiotropium (SPIRIVA HANDIHALER) 18 MCG inhalation capsule inhale the contents of one capsule in the handihaler once daily Patient taking differently: Place 18 mcg into inhaler and inhale daily. inhale the contents of one capsule in the handihaler once daily 02/17/14  Yes Alycia Rossetti, MD  levofloxacin (LEVAQUIN) 500 MG tablet Take 1 tablet (500 mg total) by mouth daily. Patient not taking: Reported on 04/19/2014 03/31/14   Alycia Rossetti, MD  loratadine (CLARITIN) 10 MG tablet Take 1 tablet (10 mg total) by mouth daily. Patient not taking: Reported on 04/19/2014 08/27/13   Alycia Rossetti, MD   BP 123/76 mmHg  Pulse 69  Temp(Src) 97.7 F (36.5 C) (Oral)  Resp 17  Ht 5\' 7"  (1.702 m)  Wt 150 lb (68.04 kg)  BMI 23.49 kg/m2  SpO2 96% Physical Exam  Constitutional: He is oriented to person, place, and time. He appears well-developed and well-nourished. No distress.  HENT:  Head: Normocephalic and atraumatic.  Mouth/Throat: Oropharynx is clear and moist.  Eyes: Conjunctivae and EOM are normal. Pupils are equal, round, and reactive to light.  Neck: Normal range of motion.  Cardiovascular: Normal rate, regular rhythm and normal heart sounds.   No murmur heard. Pulmonary/Chest: Effort normal and breath sounds normal.  Abdominal: Soft. Bowel sounds are normal. There is no tenderness.  Musculoskeletal: Normal range of motion.  Neurological: He is alert and oriented to person, place, and time. No cranial nerve deficit. He exhibits normal muscle tone. Coordination normal.  Skin: Skin is warm. No rash noted.  Nursing note and vitals reviewed.   ED Course  Procedures (including critical care time) Labs Review Labs Reviewed  COMPREHENSIVE METABOLIC PANEL - Abnormal; Notable for the following:    Albumin 3.3 (*)     GFR calc non Af Amer 68 (*)    GFR calc Af Amer 78 (*)    All other components within normal limits  CBC WITH DIFFERENTIAL/PLATELET - Abnormal; Notable for the following:    Eosinophils Relative 6 (*)    All other components within normal limits  URINALYSIS, ROUTINE W REFLEX MICROSCOPIC - Abnormal; Notable for the following:    Leukocytes, UA TRACE (*)    All other components within normal limits  LIPASE, BLOOD  URINE MICROSCOPIC-ADD ON    Imaging Review Dg Chest 2 View  04/19/2014   CLINICAL DATA:  Weakness.  Falls twice today.  Shortness of breath.  EXAM: CHEST  2 VIEW  COMPARISON:  08/18/2013  FINDINGS: Atherosclerotic aortic arch.  Heart size normal.  Emphysema.  Large lung volumes.  The lungs appear clear.  IMPRESSION: 1. Emphysema. 2. Atherosclerosis.   Electronically Signed   By: Thayer Jew  Janeece Fitting M.D.   On: 04/19/2014 17:26   Ct Head Wo Contrast  04/19/2014   CLINICAL DATA:  Weakness from waist down beginning yesterday, fell today but did not strike head, history hypertension, skin cancer, COPD, hyperlipidemia  EXAM: CT HEAD WITHOUT CONTRAST  TECHNIQUE: Contiguous axial images were obtained from the base of the skull through the vertex without intravenous contrast.  COMPARISON:  None  FINDINGS: Mild generalized atrophy.  Normal ventricular morphology.  No midline shift or mass effect.  No extra-axial fluid collections.  Fluid versus mucus within LEFT maxillary sinus.  Bones and sinuses otherwise unremarkable.  IMPRESSION: No acute intracranial abnormalities.  Small amount of fluid or mucus within LEFT maxillary sinus.   Electronically Signed   By: Lavonia Dana M.D.   On: 04/19/2014 17:09   Mr Brain Wo Contrast  04/19/2014   CLINICAL DATA:  74 year old hypertensive male with weakness and confusion. Multiple falls. No head trauma. Initial encounter.  EXAM: MRI HEAD WITHOUT CONTRAST  TECHNIQUE: Multiplanar, multiecho pulse sequences of the brain and surrounding structures were obtained without  intravenous contrast.  COMPARISON:  04/19/2014 head CT.  No comparison brain MR.  FINDINGS: Exam is motion degraded.  No acute infarct.  No intracranial hemorrhage.  No intracranial mass lesion noted on this unenhanced exam.  Mild atrophy without hydrocephalus  Major intracranial vascular structures are patent.  Partial opacification right mastoid air cells without obstructing lesion of the eustachian tube noted.  Polypoid opacification left maxillary sinus. Minimal to mild mucosal thickening ethmoid sinus air cells, frontal sinuses and right maxillary sinus.  Small pituitary gland incidentally noted. Cervical medullary junction unremarkable. Orbital structures unremarkable with the exception of prior left lens replacement.  IMPRESSION: Exam is motion degraded.  No acute infarct.  Mild global atrophy without hydrocephalus.  Partial opacification right mastoid air cells without obstructing lesion of the eustachian tube noted.  Polypoid opacification left maxillary sinus. Minimal to mild mucosal thickening ethmoid sinus air cells, frontal sinuses and right maxillary sinus.   Electronically Signed   By: Genia Del M.D.   On: 04/19/2014 19:57     EKG Interpretation   Date/Time:  Monday April 19 2014 15:50:22 EDT Ventricular Rate:  71 PR Interval:  144 QRS Duration: 99 QT Interval:  412 QTC Calculation: 448 R Axis:   90 Text Interpretation:  Sinus rhythm Borderline right axis deviation  Nonspecific T abnormalities, lateral leads No significant change since  last tracing Confirmed by Mainor Hellmann  MD, Johann Santone [phone]) on 04/19/2014 3:53:11  PM      MDM   Final diagnoses:  SOB (shortness of breath)  Weakness    Patient with generalized weakness legs and arms patient thought maybe worse on the left side on exam no obvious neuro deficit CT head negative other than some chronic type sinusitis changes. MRI without any acute changes. Lab workup without any significant abnormalities. Patient has a  long-standing history of COPD normally on 3 L of oxygen oxygen saturation's been fine. Chest x-ray showed no evidence of any acute changes. Patient we discharged home with follow-up with his doctor.    Fredia Sorrow, MD 04/19/14 2047

## 2014-04-19 NOTE — Discharge Instructions (Signed)
Workup for the weakness without any acute findings. Follow-up with your regular doctor. Return for any new or worse symptoms.

## 2014-04-19 NOTE — ED Notes (Signed)
Pt states he is weak and has no energy. States he has fallen twice today.

## 2014-04-19 NOTE — ED Notes (Signed)
Discharge instructions given and reviewed with patient.  Patient verbalized understanding to keep appointment with his doctor tomorrow as scheduled and to return to ED for worsening symptoms.  Patient discharged home in good condition.

## 2014-04-19 NOTE — ED Notes (Signed)
EDP approved for pt to have ice chips at this time

## 2014-04-19 NOTE — Telephone Encounter (Signed)
Pt wife [name] called stating that pt is having weakness, mostly when he walks and sitting up in bed, also states that he is having edema in legs and feet and no control over his movements, I advised pt that we can put pt on schedule today to be seen by Dr. Buelah Manis, wife went and spoke to pt and he stated that he does not want to be seen today. I advised wife that I recommend he be seen with is issues going on. Pt wife states he is adament that he does not want to come in today to be seen. I suggested to wife that pt can either come here or go to ED right away if symptoms get worse. Pt stated that she would like for him to be seen tomorrow with Dr.[place]. I feel like pt may be having some stroke symptoms and done everything I could to have him to either come here to be seen or ED(preferably ED). Pt has refused at this time. Informed wife to call ambulance if she feels she can not get him to go to ED herself and they will come out and evaluate him and take him to ED.

## 2014-04-20 ENCOUNTER — Ambulatory Visit (INDEPENDENT_AMBULATORY_CARE_PROVIDER_SITE_OTHER): Payer: Commercial Managed Care - HMO | Admitting: Family Medicine

## 2014-04-20 ENCOUNTER — Encounter: Payer: Self-pay | Admitting: Family Medicine

## 2014-04-20 VITALS — BP 150/86 | HR 78 | Temp 98.6°F | Resp 20 | Ht 67.0 in | Wt 149.0 lb

## 2014-04-20 DIAGNOSIS — J0100 Acute maxillary sinusitis, unspecified: Secondary | ICD-10-CM

## 2014-04-20 DIAGNOSIS — J418 Mixed simple and mucopurulent chronic bronchitis: Secondary | ICD-10-CM | POA: Diagnosis not present

## 2014-04-20 DIAGNOSIS — J019 Acute sinusitis, unspecified: Secondary | ICD-10-CM | POA: Insufficient documentation

## 2014-04-20 DIAGNOSIS — F419 Anxiety disorder, unspecified: Secondary | ICD-10-CM | POA: Diagnosis not present

## 2014-04-20 DIAGNOSIS — R531 Weakness: Secondary | ICD-10-CM | POA: Diagnosis not present

## 2014-04-20 DIAGNOSIS — R2681 Unsteadiness on feet: Secondary | ICD-10-CM | POA: Insufficient documentation

## 2014-04-20 DIAGNOSIS — I10 Essential (primary) hypertension: Secondary | ICD-10-CM

## 2014-04-20 MED ORDER — LORATADINE 10 MG PO TABS: 10.0000 mg | ORAL_TABLET | Freq: Every day | ORAL | Status: DC

## 2014-04-20 MED ORDER — PREDNISONE 10 MG PO TABS: ORAL_TABLET | ORAL | Status: DC

## 2014-04-20 MED ORDER — AMOXICILLIN-POT CLAVULANATE 875-125 MG PO TABS: 1.0000 | ORAL_TABLET | Freq: Two times a day (BID) | ORAL | Status: DC

## 2014-04-20 NOTE — Assessment & Plan Note (Signed)
I think some of the pollen has caused some increased WOB though his sats are good, advised he can keep around 2 L during the day I gave prednisone in case his breathing worsens

## 2014-04-20 NOTE — Assessment & Plan Note (Signed)
Continue the prozac he is taking 1/2 to 1 tablet twice a day as needed, very difficult home life

## 2014-04-20 NOTE — Patient Instructions (Addendum)
Take antibiotics  As prescribed Take prednisone as prescribed  Allergy medication Walker/cane to be done  Continue your oxygen therapy  Physical at therapy at home  F/U as previous

## 2014-04-20 NOTE — Progress Notes (Signed)
Patient ID: [name], male   DOB: April 10, 1940, 74 y.o.   MRN: [id number]   Subjective:    Patient ID: [name], male    DOB: 1940-05-27, 74 y.o.   MRN: [id number]  Patient presents for Weakness  patient here with weakness. He was actually seen in the emergency room yesterday after he had had a couple falls. He states he just felt weak over the past couple weeks. He has been outside and the pollen has had some mild cough , dizziness and some sinus drainage but otherwise has not been very short of breath or wheezy. He's been using his oxygen is normal typically 3 L. He states that he did fall out of his chair as the wheel was flat when he was outside otherwise the other few times he is followed as when he has been in the house when times his legs, both without on him another time he fell backwards in the kitchen. He states that he had a walker patient no longer knows where this was located he also has a cane today but it is very short and he requested new prescriptions for these. I reviewed the emergency room evaluation he had a CT scan as well as MRI of his brain which only showed sinusitis no new lesions or stroke. He had a chest x-ray which was clear no evidence of pneumonia his blood work was unremarkable his urinalysis did not show any evidence of infection. He was discharged home to follow-up with me in the office.  He is here today with his daughter whom has never accompany him to any other visits before and was quite inquisitive  Reviewed meds- he states he is taking as prescribed but only takes 1/2 of xanax as needed ( which is actually the same amount he was taking before I increased to dose)  Review Of Systems:  GEN- + fatigue, fever, weight loss,weakness, recent illness HEENT- denies eye drainage, change in vision, nasal discharge, CVS- denies chest pain, palpitations RESP- denies SOB, cough, wheeze ABD- denies N/V, change in stools, abd pain GU- denies dysuria, hematuria,  dribbling, incontinence MSK- denies joint pain, muscle aches, injury Neuro- denies headache, +dizziness, syncope, seizure activity       Objective:    BP 150/86 mmHg  Pulse 78  Temp(Src) 98.6 F (37 C) (Oral)  Resp 20  Ht 5\' 7"  (1.702 m)  Wt 149 lb (67.586 kg)  BMI 23.33 kg/m2  SpO2 98% GEN- NAD, alert and oriented x3, walking sat on 2L 97% HEENT- PERRL, EOMI, non injected sclera, pink conjunctiva, MMM, oropharynx clear, nares clear rhinorrhea, no maxillary sinus tenderness Neck- Supple, no LAD CVS- RRR, no murmur RESP-decreased at bases, few wheeze, normal WOB at rest, no rales Neuro-CNII-XII intact, slow unsteady gait, using a short walker, motor equal bilat 4/5 upper and lower ext, no gross tremor, sensation grossly intact Psych- not anxious appearing, or depressed EXT- No edema Pulses- Radial - 2+        Assessment & Plan:      Problem List Items Addressed This Visit    None      Note: This dictation was prepared with Dragon dictation along with smaller phrase technology. Any transcriptional errors that result from this process are unintentional.

## 2014-04-20 NOTE — Assessment & Plan Note (Signed)
BP looks okay, has well controlled blood pressure

## 2014-04-20 NOTE — Assessment & Plan Note (Signed)
Will treat as this is the only source of infection or cause of dizziness/weakness I can find , will also restart his allergy medication Augmentin given

## 2014-04-20 NOTE — Assessment & Plan Note (Signed)
Per above mostly MTF, treat infection per above, will have PT work with him, they can also size him for a walker/cane He will get hooveround serviced and small  portable oxygen

## 2014-04-21 ENCOUNTER — Telehealth: Payer: Self-pay | Admitting: *Deleted

## 2014-04-21 NOTE — Telephone Encounter (Signed)
Recieved call from [facility] with [facility].   Reports that order was received for patient for PT and DME.   States that [facility] can provide PT services for patient, ut D/T [employer], DME would need to be sent to [place].   Order placed on MD desk for signature.

## 2014-04-26 ENCOUNTER — Other Ambulatory Visit: Payer: Self-pay | Admitting: *Deleted

## 2014-04-26 MED ORDER — AMLODIPINE BESYLATE 10 MG PO TABS: ORAL_TABLET | ORAL | Status: DC

## 2014-04-26 NOTE — Telephone Encounter (Signed)
Received fax requesting refill on Amlodipine.   Refill appropriate and filled per protocol. 

## 2014-04-28 DIAGNOSIS — J418 Mixed simple and mucopurulent chronic bronchitis: Secondary | ICD-10-CM | POA: Diagnosis not present

## 2014-04-28 DIAGNOSIS — R531 Weakness: Secondary | ICD-10-CM | POA: Diagnosis not present

## 2014-04-28 DIAGNOSIS — F419 Anxiety disorder, unspecified: Secondary | ICD-10-CM | POA: Diagnosis not present

## 2014-04-28 DIAGNOSIS — R2681 Unsteadiness on feet: Secondary | ICD-10-CM | POA: Diagnosis not present

## 2014-05-01 DIAGNOSIS — J449 Chronic obstructive pulmonary disease, unspecified: Secondary | ICD-10-CM | POA: Diagnosis not present

## 2014-05-09 DIAGNOSIS — J449 Chronic obstructive pulmonary disease, unspecified: Secondary | ICD-10-CM | POA: Diagnosis not present

## 2014-05-31 DIAGNOSIS — J449 Chronic obstructive pulmonary disease, unspecified: Secondary | ICD-10-CM | POA: Diagnosis not present

## 2014-06-08 DIAGNOSIS — J449 Chronic obstructive pulmonary disease, unspecified: Secondary | ICD-10-CM | POA: Diagnosis not present

## 2014-06-17 ENCOUNTER — Other Ambulatory Visit: Payer: Self-pay | Admitting: *Deleted

## 2014-06-17 MED ORDER — ATORVASTATIN CALCIUM 40 MG PO TABS: ORAL_TABLET | ORAL | Status: DC

## 2014-06-17 NOTE — Telephone Encounter (Signed)
Received fax requesting refill on Lipitor.   Refill appropriate and filled per protocol.

## 2014-06-21 ENCOUNTER — Other Ambulatory Visit: Payer: Self-pay | Admitting: *Deleted

## 2014-06-21 MED ORDER — FLUOXETINE HCL 40 MG PO CAPS: 40.0000 mg | ORAL_CAPSULE | Freq: Every day | ORAL | Status: DC

## 2014-06-21 NOTE — Telephone Encounter (Signed)
Received fax requesting refill on Prozac.   Refill appropriate and filled per protocol.

## 2014-06-28 ENCOUNTER — Other Ambulatory Visit: Payer: Self-pay | Admitting: Family Medicine

## 2014-06-28 NOTE — Telephone Encounter (Signed)
Medication called to pharmacy. 

## 2014-06-28 NOTE — Telephone Encounter (Signed)
Ok to refill??  Last office visit 04/20/2014.  Last refill 03/31/2014, #2 refills.

## 2014-06-28 NOTE — Telephone Encounter (Signed)
Okay to refill? 

## 2014-07-01 DIAGNOSIS — J449 Chronic obstructive pulmonary disease, unspecified: Secondary | ICD-10-CM | POA: Diagnosis not present

## 2014-07-05 ENCOUNTER — Other Ambulatory Visit: Payer: Self-pay | Admitting: *Deleted

## 2014-07-05 MED ORDER — ALBUTEROL SULFATE HFA 108 (90 BASE) MCG/ACT IN AERS: 1.0000 | INHALATION_SPRAY | RESPIRATORY_TRACT | Status: DC | PRN

## 2014-07-05 NOTE — Telephone Encounter (Signed)
Received fax requesting refill on inhaler.   Refill appropriate and filled per protocol.

## 2014-07-09 DIAGNOSIS — J449 Chronic obstructive pulmonary disease, unspecified: Secondary | ICD-10-CM | POA: Diagnosis not present

## 2014-07-13 DIAGNOSIS — H2511 Age-related nuclear cataract, right eye: Secondary | ICD-10-CM | POA: Diagnosis not present

## 2014-07-13 DIAGNOSIS — H25041 Posterior subcapsular polar age-related cataract, right eye: Secondary | ICD-10-CM | POA: Diagnosis not present

## 2014-07-13 DIAGNOSIS — H25811 Combined forms of age-related cataract, right eye: Secondary | ICD-10-CM | POA: Diagnosis not present

## 2014-07-13 DIAGNOSIS — H25011 Cortical age-related cataract, right eye: Secondary | ICD-10-CM | POA: Diagnosis not present

## 2014-07-31 DIAGNOSIS — J449 Chronic obstructive pulmonary disease, unspecified: Secondary | ICD-10-CM | POA: Diagnosis not present

## 2014-08-02 ENCOUNTER — Encounter: Payer: Self-pay | Admitting: Family Medicine

## 2014-08-02 ENCOUNTER — Ambulatory Visit (INDEPENDENT_AMBULATORY_CARE_PROVIDER_SITE_OTHER): Payer: Commercial Managed Care - HMO | Admitting: Family Medicine

## 2014-08-02 VITALS — BP 130/64 | HR 70 | Temp 98.6°F | Resp 16 | Ht 67.0 in | Wt 139.0 lb

## 2014-08-02 DIAGNOSIS — J418 Mixed simple and mucopurulent chronic bronchitis: Secondary | ICD-10-CM | POA: Diagnosis not present

## 2014-08-02 DIAGNOSIS — Z720 Tobacco use: Secondary | ICD-10-CM

## 2014-08-02 DIAGNOSIS — E785 Hyperlipidemia, unspecified: Secondary | ICD-10-CM | POA: Diagnosis not present

## 2014-08-02 DIAGNOSIS — F418 Other specified anxiety disorders: Secondary | ICD-10-CM | POA: Diagnosis not present

## 2014-08-02 DIAGNOSIS — I10 Essential (primary) hypertension: Secondary | ICD-10-CM | POA: Diagnosis not present

## 2014-08-02 MED ORDER — NITROGLYCERIN 0.4 MG SL SUBL: SUBLINGUAL_TABLET | SUBLINGUAL | Status: DC

## 2014-08-02 NOTE — Patient Instructions (Addendum)
Continue current medications We will call with lab results Use your oxygen- especially at nighttime  F/U 4 months

## 2014-08-02 NOTE — Assessment & Plan Note (Addendum)
Currently at baseline. No Change to medication he also has pulmonologist Advised to use oxygen, at bedtime and during day as needed

## 2014-08-02 NOTE — Assessment & Plan Note (Signed)
Check labs. Continue Lipitor

## 2014-08-02 NOTE — Progress Notes (Signed)
Patient ID: [name], male   DOB: 1940/03/06, 74 y.o.   MRN: [id number]   Subjective:    Patient ID: [name], male    DOB: 16-Apr-1940, 74 y.o.   MRN: [id number]  Patient presents for 4 month F/U patient follow-up chronic medical problems. No particular concerns today. He still is short of breath with minimal exertion however he does not like to wear his oxygen like he should. He is also smoking cigars last week he is breathing better he is using his inhalers as prescribed. He also requested handicap form Depression/anxiety since taking Prozac 40 mg states this works well for him he only has to take one Xanax a day and is healthy . -he also states that he randomly checked his oxygen and heart rate 40s sitting and then jump up to the 70's , denies CP, palpitations.  He did states his NTG script expired, has not used in > 1 year   Review Of Systems:  GEN- denies fatigue, fever, weight loss,weakness, recent illness HEENT- denies eye drainage, change in vision, nasal discharge, CVS- denies chest pain, palpitations RESP-+SOB, cough, wheeze ABD- denies N/V, change in stools, abd pain GU- denies dysuria, hematuria, dribbling, incontinence MSK- denies joint pain, muscle aches, injury Neuro- denies headache, dizziness, syncope, seizure activity       Objective:    BP 130/64 mmHg  Pulse 70  Temp(Src) 98.6 F (37 C) (Oral)  Resp 16  Ht 5\' 7"  (1.702 m)  Wt 139 lb (63.05 kg)  BMI 21.77 kg/m2  SpO2 96% GEN- NAD, alert and oriented x3 HEENT- PERRL, EOMI, non injected sclera, pink conjunctiva, MMM, oropharynx clear CVS- RRR, no murmur RESP- clear no wheeze , BS decreased bases  Psych- normal affect and mood EXT- No edema Pulses- Radial, 2+        Assessment & Plan:      Problem List Items Addressed This Visit    Hyperlipidemia   Relevant Orders   Lipid panel   Essential hypertension, benign   Relevant Orders   Comprehensive metabolic panel   CBC with  Differential/Platelet   COPD (chronic obstructive pulmonary disease) - Primary      Note: This dictation was prepared with Dragon dictation along with smaller phrase technology. Any transcriptional errors that result from this process are unintentional.

## 2014-08-02 NOTE — Assessment & Plan Note (Signed)
Continue prozac. 

## 2014-08-02 NOTE — Assessment & Plan Note (Signed)
BP well controlled, no rate lowering medications, no bradycardia in office, very transient

## 2014-08-02 NOTE — Assessment & Plan Note (Signed)
Discussed the importance of tobacco cessation as this worsens his COPD

## 2014-08-03 LAB — CBC WITH DIFFERENTIAL/PLATELET
BASOS PCT: 0 % (ref 0–1)
Basophils Absolute: 0 10*3/uL (ref 0.0–0.1)
Eosinophils Absolute: 0.2 10*3/uL (ref 0.0–0.7)
Eosinophils Relative: 4 % (ref 0–5)
HCT: 48.2 % (ref 39.0–52.0)
Hemoglobin: 16.2 g/dL (ref 13.0–17.0)
LYMPHS ABS: 1.5 10*3/uL (ref 0.7–4.0)
LYMPHS PCT: 29 % (ref 12–46)
MCH: 30.5 pg (ref 26.0–34.0)
MCHC: 33.6 g/dL (ref 30.0–36.0)
MCV: 90.6 fL (ref 78.0–100.0)
MPV: 11.4 fL (ref 8.6–12.4)
Monocytes Absolute: 0.3 10*3/uL (ref 0.1–1.0)
Monocytes Relative: 6 % (ref 3–12)
Neutro Abs: 3.2 10*3/uL (ref 1.7–7.7)
Neutrophils Relative %: 61 % (ref 43–77)
Platelets: 274 10*3/uL (ref 150–400)
RBC: 5.32 MIL/uL (ref 4.22–5.81)
RDW: 14.9 % (ref 11.5–15.5)
WBC: 5.3 10*3/uL (ref 4.0–10.5)

## 2014-08-03 LAB — COMPREHENSIVE METABOLIC PANEL
ALBUMIN: 3.7 g/dL (ref 3.5–5.2)
ALK PHOS: 109 U/L (ref 39–117)
ALT: 10 U/L (ref 0–53)
AST: 16 U/L (ref 0–37)
BILIRUBIN TOTAL: 0.3 mg/dL (ref 0.2–1.2)
BUN: 16 mg/dL (ref 6–23)
CALCIUM: 9.3 mg/dL (ref 8.4–10.5)
CO2: 28 meq/L (ref 19–32)
CREATININE: 1.13 mg/dL (ref 0.50–1.35)
Chloride: 107 mEq/L (ref 96–112)
Glucose, Bld: 79 mg/dL (ref 70–99)
POTASSIUM: 4.6 meq/L (ref 3.5–5.3)
SODIUM: 143 meq/L (ref 135–145)
TOTAL PROTEIN: 6.1 g/dL (ref 6.0–8.3)

## 2014-08-03 LAB — LIPID PANEL
CHOLESTEROL: 171 mg/dL (ref 0–200)
HDL: 30 mg/dL — ABNORMAL LOW (ref >=40)
LDL Cholesterol: 111 mg/dL — ABNORMAL HIGH (ref 0–99)
TRIGLYCERIDES: 148 mg/dL (ref <150)
Total CHOL/HDL Ratio: 5.7 Ratio
VLDL: 30 mg/dL (ref 0–40)

## 2014-08-06 MED ORDER — ROSUVASTATIN CALCIUM 10 MG PO TABS: 10.0000 mg | ORAL_TABLET | Freq: Every day | ORAL | Status: DC

## 2014-08-06 NOTE — Addendum Note (Signed)
Addended by: Sheral Flow on: 08/06/2014 04:33 PM   Modules accepted: Orders, Medications

## 2014-08-08 DIAGNOSIS — J449 Chronic obstructive pulmonary disease, unspecified: Secondary | ICD-10-CM | POA: Diagnosis not present

## 2014-08-30 ENCOUNTER — Encounter: Payer: Commercial Managed Care - HMO | Admitting: Family Medicine

## 2014-08-31 DIAGNOSIS — J449 Chronic obstructive pulmonary disease, unspecified: Secondary | ICD-10-CM | POA: Diagnosis not present

## 2014-09-08 DIAGNOSIS — J449 Chronic obstructive pulmonary disease, unspecified: Secondary | ICD-10-CM | POA: Diagnosis not present

## 2014-09-10 ENCOUNTER — Other Ambulatory Visit: Payer: Self-pay | Admitting: Family Medicine

## 2014-09-10 MED ORDER — TIOTROPIUM BROMIDE MONOHYDRATE 18 MCG IN CAPS: ORAL_CAPSULE | RESPIRATORY_TRACT | Status: DC

## 2014-09-10 NOTE — Telephone Encounter (Signed)
Medication refilled per protocol. 

## 2014-09-21 ENCOUNTER — Encounter: Payer: Commercial Managed Care - HMO | Admitting: Family Medicine

## 2014-09-30 ENCOUNTER — Other Ambulatory Visit: Payer: Self-pay | Admitting: Family Medicine

## 2014-09-30 NOTE — Telephone Encounter (Signed)
Ok to refill??  Last office visit 07/31/2014.  Last refill 06/28/2014, #2 refills.

## 2014-10-01 DIAGNOSIS — J449 Chronic obstructive pulmonary disease, unspecified: Secondary | ICD-10-CM | POA: Diagnosis not present

## 2014-10-01 NOTE — Telephone Encounter (Signed)
Medication refilled per protocol. 

## 2014-10-01 NOTE — Telephone Encounter (Signed)
Okay to refill? 

## 2014-10-09 DIAGNOSIS — J449 Chronic obstructive pulmonary disease, unspecified: Secondary | ICD-10-CM | POA: Diagnosis not present

## 2014-10-27 ENCOUNTER — Encounter: Payer: Commercial Managed Care - HMO | Admitting: Family Medicine

## 2014-10-31 DIAGNOSIS — J449 Chronic obstructive pulmonary disease, unspecified: Secondary | ICD-10-CM | POA: Diagnosis not present

## 2014-11-01 ENCOUNTER — Other Ambulatory Visit: Payer: Self-pay | Admitting: *Deleted

## 2014-11-01 MED ORDER — AMLODIPINE BESYLATE 10 MG PO TABS: ORAL_TABLET | ORAL | Status: DC

## 2014-11-01 NOTE — Telephone Encounter (Signed)
Received fax requesting refill on Amlodipine.   Refill appropriate and filled per protocol. 

## 2014-11-08 DIAGNOSIS — J449 Chronic obstructive pulmonary disease, unspecified: Secondary | ICD-10-CM | POA: Diagnosis not present

## 2014-12-01 DIAGNOSIS — J449 Chronic obstructive pulmonary disease, unspecified: Secondary | ICD-10-CM | POA: Diagnosis not present

## 2014-12-06 ENCOUNTER — Other Ambulatory Visit: Payer: Self-pay | Admitting: *Deleted

## 2014-12-06 MED ORDER — ROSUVASTATIN CALCIUM 10 MG PO TABS: 10.0000 mg | ORAL_TABLET | Freq: Every day | ORAL | Status: DC

## 2014-12-06 NOTE — Telephone Encounter (Signed)
Received fax requesting refill on Crestor.   Refill appropriate and filled per protocol.  

## 2014-12-09 DIAGNOSIS — J449 Chronic obstructive pulmonary disease, unspecified: Secondary | ICD-10-CM | POA: Diagnosis not present

## 2014-12-10 ENCOUNTER — Ambulatory Visit: Payer: Commercial Managed Care - HMO | Admitting: Family Medicine

## 2014-12-15 ENCOUNTER — Other Ambulatory Visit: Payer: Self-pay | Admitting: *Deleted

## 2014-12-15 MED ORDER — ALBUTEROL SULFATE HFA 108 (90 BASE) MCG/ACT IN AERS: 1.0000 | INHALATION_SPRAY | RESPIRATORY_TRACT | Status: DC | PRN

## 2014-12-15 NOTE — Telephone Encounter (Signed)
Received fax requesting refill on Albuterol inhaler.   Refill appropriate and filled per protocol.

## 2014-12-29 ENCOUNTER — Telehealth: Payer: Self-pay | Admitting: *Deleted

## 2014-12-29 NOTE — Telephone Encounter (Signed)
okay

## 2014-12-29 NOTE — Telephone Encounter (Signed)
Received fax requesting refill on Xanax.   Ok to refill??  Last office visit 08/02/2014.  Last refill 10/01/2014, #2 refills.

## 2014-12-30 MED ORDER — ALPRAZOLAM 1 MG PO TABS: ORAL_TABLET | ORAL | Status: DC

## 2014-12-30 NOTE — Telephone Encounter (Signed)
Medication called to pharmacy. 

## 2014-12-31 DIAGNOSIS — J449 Chronic obstructive pulmonary disease, unspecified: Secondary | ICD-10-CM | POA: Diagnosis not present

## 2015-01-08 DIAGNOSIS — J449 Chronic obstructive pulmonary disease, unspecified: Secondary | ICD-10-CM | POA: Diagnosis not present

## 2015-01-31 DIAGNOSIS — J449 Chronic obstructive pulmonary disease, unspecified: Secondary | ICD-10-CM | POA: Diagnosis not present

## 2015-02-08 DIAGNOSIS — J449 Chronic obstructive pulmonary disease, unspecified: Secondary | ICD-10-CM | POA: Diagnosis not present

## 2015-02-11 ENCOUNTER — Other Ambulatory Visit: Payer: Self-pay | Admitting: *Deleted

## 2015-02-11 MED ORDER — ALBUTEROL SULFATE HFA 108 (90 BASE) MCG/ACT IN AERS: 2.0000 | INHALATION_SPRAY | Freq: Four times a day (QID) | RESPIRATORY_TRACT | Status: DC | PRN

## 2015-02-11 NOTE — Telephone Encounter (Signed)
Received fax from insurance regarding the prescription for ProAir.   Insurance will not cover this medication.   MD made aware and recommendations are as follows:  D/C ProAir.  Begin Ventolin.    Prescription sent to pharmacy.

## 2015-02-16 DIAGNOSIS — J449 Chronic obstructive pulmonary disease, unspecified: Secondary | ICD-10-CM | POA: Diagnosis not present

## 2015-02-18 ENCOUNTER — Encounter: Payer: Self-pay | Admitting: *Deleted

## 2015-02-18 ENCOUNTER — Other Ambulatory Visit: Payer: Self-pay | Admitting: *Deleted

## 2015-02-18 MED ORDER — LORATADINE 10 MG PO TABS: 10.0000 mg | ORAL_TABLET | Freq: Every day | ORAL | Status: DC

## 2015-02-18 NOTE — Telephone Encounter (Signed)
Received fax requesting refill on Claritin.   Medication filled x1 with no refills.   Requires office visit before any further refills can be given.   Letter sent.

## 2015-02-21 ENCOUNTER — Other Ambulatory Visit: Payer: Self-pay | Admitting: *Deleted

## 2015-02-21 MED ORDER — LORATADINE 10 MG PO TABS: 10.0000 mg | ORAL_TABLET | Freq: Every day | ORAL | Status: DC

## 2015-02-21 NOTE — Telephone Encounter (Signed)
Received fax requesting refill on Claritin.   Medication filled x1 with no refills.   Requires office visit before any further refills can be given.

## 2015-02-22 ENCOUNTER — Ambulatory Visit: Payer: Commercial Managed Care - HMO | Admitting: Family Medicine

## 2015-02-28 ENCOUNTER — Ambulatory Visit: Payer: Commercial Managed Care - HMO | Admitting: Family Medicine

## 2015-03-03 DIAGNOSIS — J449 Chronic obstructive pulmonary disease, unspecified: Secondary | ICD-10-CM | POA: Diagnosis not present

## 2015-03-08 ENCOUNTER — Encounter: Payer: Self-pay | Admitting: Family Medicine

## 2015-03-08 ENCOUNTER — Other Ambulatory Visit: Payer: Self-pay | Admitting: Family Medicine

## 2015-03-08 MED ORDER — BUDESONIDE-FORMOTEROL FUMARATE 160-4.5 MCG/ACT IN AERO: INHALATION_SPRAY | RESPIRATORY_TRACT | Status: DC

## 2015-03-08 NOTE — Telephone Encounter (Signed)
Medication refill for one time only.  Patient needs to be seen.  Letter sent for patient to call and schedule 

## 2015-03-11 DIAGNOSIS — J449 Chronic obstructive pulmonary disease, unspecified: Secondary | ICD-10-CM | POA: Diagnosis not present

## 2015-03-15 ENCOUNTER — Other Ambulatory Visit: Payer: Self-pay | Admitting: *Deleted

## 2015-03-15 MED ORDER — TIOTROPIUM BROMIDE MONOHYDRATE 18 MCG IN CAPS: ORAL_CAPSULE | RESPIRATORY_TRACT | Status: DC

## 2015-03-15 NOTE — Telephone Encounter (Signed)
Received fax requesting refill on Spiriva.   Refill appropriate and filled per protocol.

## 2015-03-16 DIAGNOSIS — J449 Chronic obstructive pulmonary disease, unspecified: Secondary | ICD-10-CM | POA: Diagnosis not present

## 2015-03-21 ENCOUNTER — Ambulatory Visit (INDEPENDENT_AMBULATORY_CARE_PROVIDER_SITE_OTHER): Payer: Commercial Managed Care - HMO | Admitting: Family Medicine

## 2015-03-21 ENCOUNTER — Encounter: Payer: Self-pay | Admitting: Family Medicine

## 2015-03-21 VITALS — BP 128/78 | HR 72 | Temp 98.4°F | Resp 16 | Ht 67.0 in | Wt 137.0 lb

## 2015-03-21 DIAGNOSIS — J418 Mixed simple and mucopurulent chronic bronchitis: Secondary | ICD-10-CM | POA: Diagnosis not present

## 2015-03-21 DIAGNOSIS — Z7409 Other reduced mobility: Secondary | ICD-10-CM

## 2015-03-21 DIAGNOSIS — E785 Hyperlipidemia, unspecified: Secondary | ICD-10-CM | POA: Diagnosis not present

## 2015-03-21 DIAGNOSIS — K219 Gastro-esophageal reflux disease without esophagitis: Secondary | ICD-10-CM | POA: Diagnosis not present

## 2015-03-21 DIAGNOSIS — F418 Other specified anxiety disorders: Secondary | ICD-10-CM | POA: Diagnosis not present

## 2015-03-21 DIAGNOSIS — Z720 Tobacco use: Secondary | ICD-10-CM | POA: Diagnosis not present

## 2015-03-21 DIAGNOSIS — I10 Essential (primary) hypertension: Secondary | ICD-10-CM | POA: Diagnosis not present

## 2015-03-21 DIAGNOSIS — I493 Ventricular premature depolarization: Secondary | ICD-10-CM

## 2015-03-21 MED ORDER — RANITIDINE HCL 150 MG PO TABS: ORAL_TABLET | ORAL | Status: DC

## 2015-03-21 NOTE — Assessment & Plan Note (Signed)
Blood pressure well controlled  No change tomedication

## 2015-03-21 NOTE — Assessment & Plan Note (Signed)
He is doing fairly well right now with his COPD. He continues uses oxygen. He however continues to use tobacco products which worsens his COPD. He is not ready to quit these

## 2015-03-21 NOTE — Assessment & Plan Note (Signed)
Trial of zantac

## 2015-03-21 NOTE — Progress Notes (Signed)
Patient ID: [name], male   DOB: 1940/05/11, 75 y.o.   MRN: [id number]   Subjective:    Patient ID: [name], male    DOB: [birth year], 75 y.o.   MRN: [id number]  Patient presents for Medication Review/ Refill Here to  follow-up and to review his medications. He does complain of some heartburn and belching he will sometimes get a discomfort in his stomach and take Rolaids. When asked further how often he uses relates he says sometimes up to twice a day. He did try some Prilosec when he had a severe bout after eating some country ham. He denies any emesis no change in his bowels.  He has COPD he continues to smoke cigars. He wears oxygen intermittently during the day and at bedtime. He is using his inhalers as prescribed. He has not followed up with his lung doctor. He was actually put on the schedule a few times because of illness which seemed to be COPD related but he did not want to come into the office and did not go to the ER he self treated at home with his nebulizers.   Depression and anxiety he has a lot of stress at home that he often worries over the bills. His wife is also been sick he is a lot of family members still living with him. He continues to take his Prozac states it is the only thing that really helps him. He does use this and asked as needed for breakthrough anxiety    he had some left-sided shoulder pain which he had an old injury from in the past for couple weeks. He used some type of topical anti-inflammatory and this has helped.  Review Of Systems:  GEN- denies fatigue, fever, weight loss,weakness, recent illness HEENT- denies eye drainage, change in vision, nasal discharge, CVS- denies chest pain, palpitations RESP- denies SOB, cough, wheeze ABD- denies N/V, change in stools, abd pain GU- denies dysuria, hematuria, dribbling, incontinence MSK- + joint pain, muscle aches, injury Neuro- denies headache, dizziness, syncope, seizure activity        Objective:    BP 128/78 mmHg  Pulse 72  Temp(Src) 98.4 F (36.9 C) (Oral)  Resp 16  Ht 5\' 7"  (1.702 m)  Wt 137 lb (62.143 kg)  BMI 21.45 kg/m2  SpO2 96% GEN- NAD, alert and oriented x3,walks with cane  HEENT- PERRL, EOMI, non injected sclera, pink conjunctiva, MMM, oropharynx clear Neck- Supple, no thyromegaly CVS- RRR, no murmur,few PVC  RESP-no wheeze, clear  ABD-NABS,soft,NT,ND Neuro- unsteady gait, walks with canes  Psych- normal affect and mood  EXT- No edema Pulses- Radial, 2+        Assessment & Plan:      Problem List Items Addressed This Visit    Tobacco abuse   Impaired mobility   Hyperlipidemia   Relevant Orders   Lipid panel   GERD (gastroesophageal reflux disease)    Trial of zantac      Relevant Medications   ranitidine (ZANTAC) 150 MG tablet   Essential hypertension, benign     Blood pressure well controlled  No change tomedication      Relevant Orders   CBC with Differential/Platelet   Comprehensive metabolic panel   Depression with anxiety     Continue with Prozac and Xanax as needed      COPD (chronic obstructive pulmonary disease) ([place]) - Primary     He is doing fairly well right now with his COPD. He continues uses oxygen.  He however continues to use tobacco products which worsens his COPD. He is not ready to quit these       Other Visit Diagnoses    PVC (premature ventricular contraction)        asymptomatic PVC    Relevant Orders    EKG 12-Lead (Completed)       Note: This dictation was prepared with Dragon dictation along with smaller phrase technology. Any transcriptional errors that result from this process are unintentional.

## 2015-03-21 NOTE — Assessment & Plan Note (Signed)
Continue with Prozac and Xanax as needed

## 2015-03-21 NOTE — Patient Instructions (Signed)
We will call with labs Continue current meds Zantac as needed for heartburn  Form completed F/U 6 months PHYSICAL

## 2015-03-22 LAB — COMPREHENSIVE METABOLIC PANEL
ALBUMIN: 4.2 g/dL (ref 3.6–5.1)
ALT: 10 U/L (ref 9–46)
AST: 15 U/L (ref 10–35)
Alkaline Phosphatase: 93 U/L (ref 40–115)
BILIRUBIN TOTAL: 0.5 mg/dL (ref 0.2–1.2)
BUN: 11 mg/dL (ref 7–25)
CALCIUM: 9.1 mg/dL (ref 8.6–10.3)
CO2: 29 mmol/L (ref 20–31)
CREATININE: 1.11 mg/dL (ref 0.70–1.18)
Chloride: 106 mmol/L (ref 98–110)
Glucose, Bld: 82 mg/dL (ref 70–99)
Potassium: 4 mmol/L (ref 3.5–5.3)
SODIUM: 144 mmol/L (ref 135–146)
TOTAL PROTEIN: 6.4 g/dL (ref 6.1–8.1)

## 2015-03-22 LAB — LIPID PANEL
CHOLESTEROL: 161 mg/dL (ref 125–200)
HDL: 36 mg/dL — AB (ref >=40)
LDL Cholesterol: 104 mg/dL (ref <130)
TRIGLYCERIDES: 106 mg/dL (ref <150)
Total CHOL/HDL Ratio: 4.5 Ratio (ref <=5.0)
VLDL: 21 mg/dL (ref <30)

## 2015-03-22 LAB — CBC WITH DIFFERENTIAL/PLATELET
BASOS PCT: 0 % (ref 0–1)
Basophils Absolute: 0 10*3/uL (ref 0.0–0.1)
Eosinophils Absolute: 0.1 10*3/uL (ref 0.0–0.7)
Eosinophils Relative: 2 % (ref 0–5)
HEMATOCRIT: 46.5 % (ref 39.0–52.0)
HEMOGLOBIN: 16.1 g/dL (ref 13.0–17.0)
LYMPHS PCT: 17 % (ref 12–46)
Lymphs Abs: 1.1 10*3/uL (ref 0.7–4.0)
MCH: 32 pg (ref 26.0–34.0)
MCHC: 34.6 g/dL (ref 30.0–36.0)
MCV: 92.4 fL (ref 78.0–100.0)
MONO ABS: 0.6 10*3/uL (ref 0.1–1.0)
MONOS PCT: 10 % (ref 3–12)
MPV: 12 fL (ref 8.6–12.4)
NEUTROS ABS: 4.5 10*3/uL (ref 1.7–7.7)
NEUTROS PCT: 71 % (ref 43–77)
Platelets: 195 10*3/uL (ref 150–400)
RBC: 5.03 MIL/uL (ref 4.22–5.81)
RDW: 15.9 % — ABNORMAL HIGH (ref 11.5–15.5)
WBC: 6.4 10*3/uL (ref 4.0–10.5)

## 2015-03-25 ENCOUNTER — Encounter: Payer: Self-pay | Admitting: Family Medicine

## 2015-03-31 ENCOUNTER — Other Ambulatory Visit: Payer: Self-pay | Admitting: Family Medicine

## 2015-03-31 DIAGNOSIS — J449 Chronic obstructive pulmonary disease, unspecified: Secondary | ICD-10-CM | POA: Diagnosis not present

## 2015-03-31 MED ORDER — ALPRAZOLAM 1 MG PO TABS: ORAL_TABLET | ORAL | Status: DC

## 2015-03-31 NOTE — Telephone Encounter (Signed)
Okay to refill? 

## 2015-03-31 NOTE — Telephone Encounter (Signed)
Medication refilled per protocol. 

## 2015-03-31 NOTE — Telephone Encounter (Signed)
Pt calling for refill of Alprazolam  LRF 12/30/14 #90 + 2   LOV 08/02/14  OK refill?

## 2015-04-05 ENCOUNTER — Other Ambulatory Visit: Payer: Self-pay | Admitting: *Deleted

## 2015-04-05 MED ORDER — BUDESONIDE-FORMOTEROL FUMARATE 160-4.5 MCG/ACT IN AERO: INHALATION_SPRAY | RESPIRATORY_TRACT | Status: DC

## 2015-04-05 NOTE — Telephone Encounter (Signed)
Received fax requesting refill on Symbicort.   Refill appropriate and filled per protocol. 

## 2015-04-08 ENCOUNTER — Other Ambulatory Visit: Payer: Self-pay | Admitting: *Deleted

## 2015-04-08 DIAGNOSIS — J449 Chronic obstructive pulmonary disease, unspecified: Secondary | ICD-10-CM | POA: Diagnosis not present

## 2015-04-08 MED ORDER — ROSUVASTATIN CALCIUM 10 MG PO TABS: 10.0000 mg | ORAL_TABLET | Freq: Every day | ORAL | Status: DC

## 2015-04-08 NOTE — Telephone Encounter (Signed)
Received fax requesting refill on Crestor.   Refill appropriate and filled per protocol.  

## 2015-05-01 DIAGNOSIS — J449 Chronic obstructive pulmonary disease, unspecified: Secondary | ICD-10-CM | POA: Diagnosis not present

## 2015-05-09 DIAGNOSIS — J449 Chronic obstructive pulmonary disease, unspecified: Secondary | ICD-10-CM | POA: Diagnosis not present

## 2015-05-20 ENCOUNTER — Other Ambulatory Visit: Payer: Self-pay | Admitting: *Deleted

## 2015-05-20 MED ORDER — LORATADINE 10 MG PO TABS: 10.0000 mg | ORAL_TABLET | Freq: Every day | ORAL | Status: DC

## 2015-05-20 NOTE — Telephone Encounter (Signed)
Received fax requesting refill on Loratadine.   Refill appropriate and filled per protocol.  

## 2015-05-23 ENCOUNTER — Other Ambulatory Visit: Payer: Self-pay | Admitting: *Deleted

## 2015-05-23 MED ORDER — ALBUTEROL SULFATE HFA 108 (90 BASE) MCG/ACT IN AERS: 2.0000 | INHALATION_SPRAY | Freq: Four times a day (QID) | RESPIRATORY_TRACT | Status: DC | PRN

## 2015-05-23 NOTE — Telephone Encounter (Signed)
Received fax requesting refill on ventolin.   Refill appropriate and filled per protocol.

## 2015-05-31 DIAGNOSIS — J449 Chronic obstructive pulmonary disease, unspecified: Secondary | ICD-10-CM | POA: Diagnosis not present

## 2015-06-06 ENCOUNTER — Other Ambulatory Visit: Payer: Self-pay | Admitting: *Deleted

## 2015-06-06 MED ORDER — AMLODIPINE BESYLATE 10 MG PO TABS: ORAL_TABLET | ORAL | Status: DC

## 2015-06-06 MED ORDER — FLUOXETINE HCL 40 MG PO CAPS: 40.0000 mg | ORAL_CAPSULE | Freq: Every day | ORAL | Status: DC

## 2015-06-06 NOTE — Telephone Encounter (Signed)
Received fax requesting refill on Prozac.   Refill appropriate and filled per protocol. 

## 2015-06-06 NOTE — Telephone Encounter (Signed)
Received fax requesting refill on Norvasc.   Refill appropriate and filled per protocol.  

## 2015-06-08 DIAGNOSIS — J449 Chronic obstructive pulmonary disease, unspecified: Secondary | ICD-10-CM | POA: Diagnosis not present

## 2015-06-17 ENCOUNTER — Other Ambulatory Visit: Payer: Self-pay | Admitting: Family Medicine

## 2015-06-17 MED ORDER — BUDESONIDE-FORMOTEROL FUMARATE 160-4.5 MCG/ACT IN AERO: INHALATION_SPRAY | RESPIRATORY_TRACT | Status: DC

## 2015-06-28 ENCOUNTER — Other Ambulatory Visit: Payer: Self-pay | Admitting: *Deleted

## 2015-06-28 MED ORDER — RANITIDINE HCL 150 MG PO TABS: ORAL_TABLET | ORAL | Status: DC

## 2015-06-28 NOTE — Telephone Encounter (Signed)
Received fax requesting refill on Zantac.   Refill appropriate and filled per protocol. 

## 2015-07-01 ENCOUNTER — Ambulatory Visit: Payer: Commercial Managed Care - HMO | Admitting: Family Medicine

## 2015-07-01 DIAGNOSIS — J449 Chronic obstructive pulmonary disease, unspecified: Secondary | ICD-10-CM | POA: Diagnosis not present

## 2015-07-05 ENCOUNTER — Ambulatory Visit: Payer: Commercial Managed Care - HMO | Admitting: Family Medicine

## 2015-07-05 ENCOUNTER — Other Ambulatory Visit: Payer: Self-pay | Admitting: Family Medicine

## 2015-07-05 MED ORDER — ALPRAZOLAM 1 MG PO TABS: ORAL_TABLET | ORAL | Status: DC

## 2015-07-05 NOTE — Telephone Encounter (Signed)
rx called in

## 2015-07-05 NOTE — Telephone Encounter (Signed)
LRF 03/31/15 #90 + 2   LOV 03/21/15  OK refill?

## 2015-07-05 NOTE — Telephone Encounter (Signed)
okay

## 2015-07-06 ENCOUNTER — Other Ambulatory Visit: Payer: Self-pay | Admitting: *Deleted

## 2015-07-06 MED ORDER — LORATADINE 10 MG PO TABS: 10.0000 mg | ORAL_TABLET | Freq: Every day | ORAL | Status: AC

## 2015-07-06 NOTE — Telephone Encounter (Signed)
Received fax requesting refill on Claritin.   Refill appropriate and filled per protocol.

## 2015-07-09 DIAGNOSIS — J449 Chronic obstructive pulmonary disease, unspecified: Secondary | ICD-10-CM | POA: Diagnosis not present

## 2015-07-15 ENCOUNTER — Ambulatory Visit (INDEPENDENT_AMBULATORY_CARE_PROVIDER_SITE_OTHER): Payer: Commercial Managed Care - HMO | Admitting: Family Medicine

## 2015-07-15 ENCOUNTER — Ambulatory Visit: Payer: Commercial Managed Care - HMO | Admitting: Family Medicine

## 2015-07-15 VITALS — BP 132/78 | HR 72 | Temp 98.9°F | Resp 16 | Ht 67.0 in | Wt 128.0 lb

## 2015-07-15 DIAGNOSIS — K219 Gastro-esophageal reflux disease without esophagitis: Secondary | ICD-10-CM

## 2015-07-15 DIAGNOSIS — R251 Tremor, unspecified: Secondary | ICD-10-CM | POA: Diagnosis not present

## 2015-07-15 DIAGNOSIS — B353 Tinea pedis: Secondary | ICD-10-CM | POA: Diagnosis not present

## 2015-07-15 DIAGNOSIS — E785 Hyperlipidemia, unspecified: Secondary | ICD-10-CM

## 2015-07-15 DIAGNOSIS — D239 Other benign neoplasm of skin, unspecified: Secondary | ICD-10-CM

## 2015-07-15 DIAGNOSIS — D229 Melanocytic nevi, unspecified: Secondary | ICD-10-CM

## 2015-07-15 DIAGNOSIS — L570 Actinic keratosis: Secondary | ICD-10-CM

## 2015-07-15 MED ORDER — TERBINAFINE HCL 1 % EX CREA: 1.0000 "application " | TOPICAL_CREAM | Freq: Two times a day (BID) | CUTANEOUS | Status: DC

## 2015-07-15 MED ORDER — PROPRANOLOL HCL 10 MG PO TABS: 10.0000 mg | ORAL_TABLET | Freq: Two times a day (BID) | ORAL | Status: DC

## 2015-07-15 MED ORDER — OMEPRAZOLE 20 MG PO CPDR: 20.0000 mg | DELAYED_RELEASE_CAPSULE | Freq: Every day | ORAL | Status: DC

## 2015-07-15 NOTE — Patient Instructions (Addendum)
Stop the orange zantac  Try the prilosec capsule once a day instead  Continue all other medications Referral to dermatology  Use cream on feet  Take the propranolol  F/U 4 months

## 2015-07-17 ENCOUNTER — Encounter: Payer: Self-pay | Admitting: Family Medicine

## 2015-07-17 DIAGNOSIS — R251 Tremor, unspecified: Secondary | ICD-10-CM | POA: Insufficient documentation

## 2015-07-17 NOTE — Progress Notes (Signed)
Patient ID: [name], male   DOB: [birth year], 75 y.o.   MRN: [id number]   Subjective:    Patient ID: [name], male    DOB: 15-Feb-1940, 75 y.o.   MRN: [id number]  Patient presents for F/U  Pt here for f/u , discussed his multiple no Shows and cancellations.    Tremor- has noticed tremor over past year, seems to be getting worse, affects his eating at times. Has some family members with tremor.    Zantac pill has not helped his reflux, thinks it makes it worse wants to change to another, often TUMS/ROlaids help   Peeling skin on feet, using some powder but not improved    Reviewed other mediccations    Has moles and spots on his head he is worried about, tries to wear hats, but has had a lot of sun over the years. Has one large one on forehead that scratches off then comes back   COPD- continues to use oxygen, using symbicort as prescribed, continues to smoke     Review Of Systems:  GEN- denies fatigue, fever, weight loss,weakness, recent illness HEENT- denies eye drainage, change in vision, nasal discharge, CVS- denies chest pain, palpitations RESP- denies SOB, cough, wheeze ABD- denies N/V, change in stools, abd pain GU- denies dysuria, hematuria, dribbling, incontinence MSK- denies joint pain, muscle aches, injury Neuro- denies headache, dizziness, syncope, seizure activity       Objective:    BP 132/78 mmHg  Pulse 72  Temp(Src) 98.9 F (37.2 C) (Oral)  Resp 16  Ht 5\' 7"  (1.702 m)  Wt 128 lb (58.06 kg)  BMI 20.04 kg/m2  SpO2 94% GEN- NAD, alert and oriented x3 HEENT- PERRL, EOMI, non injected sclera, pink conjunctiva, MMM, oropharynx clear Neck- Supple, fair ROM  CVS- RRR, no murmur RESP-occ scattered wheeze, normal WOB, no rhonchi Neuro- CNII-XII intact, mild resting tremor with outstreched arms, no ridgity, no gait changes  Skin- peeling dry skin on bilat soles of feet, mild erythema, crusting between toes, thick nails  Multiple AK, large seb  keratosis on front of scalp but has erythema at base, other seb keratosis, larger lesion on right cheek  EXT- No edema Pulses- Radial, DP- 2+        Assessment & Plan:      Problem List Items Addressed This Visit    Tremor    Concern for essential tremor, no singificant cognitive change Trial of low dose beta blocker, if no improvement refer to neurology       Hyperlipidemia - Primary   Relevant Medications   propranolol (INDERAL) 10 MG tablet   GERD (gastroesophageal reflux disease)    Change to prilosec once a day       Relevant Medications   omeprazole (PRILOSEC) 20 MG capsule   AK (actinic keratosis)    Other Visit Diagnoses    Tinea pedis of right foot        terbinafine cream to feet, golds bond powder for moisture    Relevant Medications    terbinafine (LAMISIL) 1 % cream    Atypical nevus        Dermatology referral, multiple lesions on forhead, scalp and face        Note: This dictation was prepared with Dragon dictation along with smaller phrase technology. Any transcriptional errors that result from this process are unintentional.

## 2015-07-17 NOTE — Assessment & Plan Note (Signed)
Concern for essential tremor, no singificant cognitive change Trial of low dose beta blocker, if no improvement refer to neurology

## 2015-07-17 NOTE — Assessment & Plan Note (Signed)
Change to prilosec once a day

## 2015-07-18 ENCOUNTER — Ambulatory Visit: Payer: Commercial Managed Care - HMO | Admitting: Family Medicine

## 2015-07-28 ENCOUNTER — Telehealth: Payer: Self-pay | Admitting: Family Medicine

## 2015-07-28 NOTE — Telephone Encounter (Signed)
Call placed to patient. LMTRC.  

## 2015-07-28 NOTE — Telephone Encounter (Signed)
Patient calling to discuss oxygen tank preferences  [phone]

## 2015-07-29 NOTE — Telephone Encounter (Signed)
Call placed to patient. LMTRC.  

## 2015-07-31 DIAGNOSIS — J449 Chronic obstructive pulmonary disease, unspecified: Secondary | ICD-10-CM | POA: Diagnosis not present

## 2015-08-01 NOTE — Telephone Encounter (Signed)
Multiple calls placed to patient with no answer and no return call.   Message to be closed.  

## 2015-08-04 ENCOUNTER — Other Ambulatory Visit: Payer: Self-pay | Admitting: Family Medicine

## 2015-08-04 MED ORDER — ROSUVASTATIN CALCIUM 10 MG PO TABS
10.0000 mg | ORAL_TABLET | Freq: Every day | ORAL | Status: DC
Start: 2015-08-04 — End: 2015-12-11

## 2015-08-04 NOTE — Telephone Encounter (Signed)
Medication called/sent to requested pharmacy  

## 2015-08-08 DIAGNOSIS — J449 Chronic obstructive pulmonary disease, unspecified: Secondary | ICD-10-CM | POA: Diagnosis not present

## 2015-08-31 DIAGNOSIS — J449 Chronic obstructive pulmonary disease, unspecified: Secondary | ICD-10-CM | POA: Diagnosis not present

## 2015-09-06 NOTE — Telephone Encounter (Signed)
Have him call his oxygen supplier about concerns about the weight The portable is the lightest that I know of

## 2015-09-06 NOTE — Telephone Encounter (Signed)
Received call from patient.   Reports that he would like to switch back to portable O2 tanks.   States that concentrator is too heavy to carry.

## 2015-09-07 NOTE — Telephone Encounter (Signed)
Call placed to patient and patient wife made aware.

## 2015-09-07 NOTE — Telephone Encounter (Signed)
Received call from patient wife, [name].   Reports that [employer] requires order form MD to D/C portable O2 concentrator and resume therapy with portable O2 tanks.  Order sent.

## 2015-09-08 DIAGNOSIS — J449 Chronic obstructive pulmonary disease, unspecified: Secondary | ICD-10-CM | POA: Diagnosis not present

## 2015-09-08 DIAGNOSIS — C4442 Squamous cell carcinoma of skin of scalp and neck: Secondary | ICD-10-CM | POA: Diagnosis not present

## 2015-09-08 DIAGNOSIS — X32XXXA Exposure to sunlight, initial encounter: Secondary | ICD-10-CM | POA: Diagnosis not present

## 2015-09-08 DIAGNOSIS — L570 Actinic keratosis: Secondary | ICD-10-CM | POA: Diagnosis not present

## 2015-09-13 ENCOUNTER — Other Ambulatory Visit: Payer: Self-pay | Admitting: Family Medicine

## 2015-09-15 DIAGNOSIS — D044 Carcinoma in situ of skin of scalp and neck: Secondary | ICD-10-CM | POA: Insufficient documentation

## 2015-09-16 DIAGNOSIS — M5136 Other intervertebral disc degeneration, lumbar region: Secondary | ICD-10-CM | POA: Diagnosis not present

## 2015-09-16 DIAGNOSIS — M62830 Muscle spasm of back: Secondary | ICD-10-CM | POA: Diagnosis not present

## 2015-09-16 DIAGNOSIS — M6281 Muscle weakness (generalized): Secondary | ICD-10-CM | POA: Diagnosis not present

## 2015-09-16 DIAGNOSIS — M545 Low back pain: Secondary | ICD-10-CM | POA: Diagnosis not present

## 2015-09-21 ENCOUNTER — Ambulatory Visit (INDEPENDENT_AMBULATORY_CARE_PROVIDER_SITE_OTHER): Payer: Commercial Managed Care - HMO | Admitting: Family Medicine

## 2015-09-21 ENCOUNTER — Encounter: Payer: Self-pay | Admitting: Family Medicine

## 2015-09-21 VITALS — BP 130/78 | HR 75 | Temp 98.4°F | Resp 22 | Ht 67.0 in | Wt 132.0 lb

## 2015-09-21 DIAGNOSIS — R251 Tremor, unspecified: Secondary | ICD-10-CM

## 2015-09-21 DIAGNOSIS — Z418 Encounter for other procedures for purposes other than remedying health state: Secondary | ICD-10-CM | POA: Diagnosis not present

## 2015-09-21 DIAGNOSIS — J418 Mixed simple and mucopurulent chronic bronchitis: Secondary | ICD-10-CM | POA: Diagnosis not present

## 2015-09-21 DIAGNOSIS — I10 Essential (primary) hypertension: Secondary | ICD-10-CM | POA: Diagnosis not present

## 2015-09-21 DIAGNOSIS — J9611 Chronic respiratory failure with hypoxia: Secondary | ICD-10-CM | POA: Diagnosis not present

## 2015-09-21 DIAGNOSIS — E785 Hyperlipidemia, unspecified: Secondary | ICD-10-CM | POA: Diagnosis not present

## 2015-09-21 DIAGNOSIS — Z720 Tobacco use: Secondary | ICD-10-CM

## 2015-09-21 DIAGNOSIS — Z0000 Encounter for general adult medical examination without abnormal findings: Secondary | ICD-10-CM | POA: Diagnosis not present

## 2015-09-21 DIAGNOSIS — Z299 Encounter for prophylactic measures, unspecified: Secondary | ICD-10-CM

## 2015-09-21 NOTE — Assessment & Plan Note (Signed)
Controlled no changes 

## 2015-09-21 NOTE — Progress Notes (Signed)
Subjective:   Patient presents for Medicare Annual/Subsequent preventive examination.  No specific concerns today. He was seen by dermatology has a lesion removed office for head which she states was some type of slow growing horn he also had some actinic keratoses frozen off of his face.  COPD he feels like his breathing has been stable. He does smoke states this actually helps his breathing despite him being on oxygen.  Medications reviewed  He states that his tremor has improved with the propanolol  Review Past Medical/Family/Social: Per EMR    Risk Factors  Current exercise habits: none, requires oxygen, uses motorized chair as needed Dietary issues discussed: None  Cardiac risk factors: HTN , smoker ,PVD   Depression Screen  - CURRENT TREATMENT  (Note: if answer to either of the following is "Yes", a more complete depression screening is indicated)  Over the past two weeks, have you felt down, depressed or hopeless? No Over the past two weeks, have you felt little interest or pleasure in doing things? No Have you lost interest or pleasure in daily life? No Do you often feel hopeless? No Do you cry easily over simple problems? No   Activities of Daily Living  In your present state of health, do you have any difficulty performing the following activities?:  Driving? No  Managing money? No  Feeding yourself? No  Getting from bed to chair? No  Climbing a flight of stairs? Yes  Preparing food and eating?: No  Bathing or showering? No  Getting dressed: No  Getting to the toilet? No  Using the toilet:No  Moving around from place to place: No  In the past year have you fallen or had a near fall?:No  Are you sexually active? No  Do you have more than one partner? No   Hearing Difficulties: Yes- Declines intervention  Do you often ask people to speak up or repeat themselves? Yes  Do you experience ringing or noises in your ears? No Do you have difficulty understanding soft  or whispered voices? YES Do you feel that you have a problem with memory? No Do you often misplace items? No  Do you feel safe at home? Yes  Cognitive Testing  Alert? Yes Normal Appearance?Yes  Oriented to person? Yes Place? Yes  Time? Yes  Recall of three objects? Yes  Can perform simple calculations? Yes  Displays appropriate judgment?Yes  Can read the correct time from a watch face?Yes   List the Names of Other Physician/Practitioners you currently use:   Pulmonary, dermatology   Screening Tests / Date Colonoscopy      - aged out              Zostavax  Declines  Pneumonia- UTD  Influenza Vaccine -Due today  Tetanus/tdap - declines  ROS:  GEN- denies fatigue, fever, weight loss,weakness, recent illness HEENT- denies eye drainage, change in vision, nasal discharge, CVS- denies chest pain, palpitations RESP- denies SOB, cough, wheeze ABD- denies N/V, change in stools, abd pain GU- denies dysuria, hematuria, dribbling, incontinence MSK- denies joint pain, muscle aches, injury Neuro- denies headache, dizziness, syncope, seizure activity  Physical: GEN- NAD, alert and oriented x3 HEENT- PERRL, EOMI, non injected sclera, pink conjunctiva, MMM, oropharynx clear Neck- Supple, no thryomegaly CVS- RRR, no murmur,distant HS  RESP- good air movement, minimal wheeze, no rales, oxgen 2 L ABD-NABS,soft,NT,ND Psych- Normal affect and mood  EXT- No edema Pulses- Radial, DP- 2+    Assessment:    Annual wellness medicare  exam   Plan:    During the course of the visit the patient was educated and counseled about appropriate screening and preventive services including:   Flu shot given     Diet review for nutrition referral? Yes ____ Not Indicated __x__  Patient Instructions (the written plan) was given to the patient.  Medicare Attestation  I have personally reviewed:  The patient's medical and social history  Their use of alcohol, tobacco or illicit drugs  Their current  medications and supplements  The patient's functional ability including ADLs,fall risks, home safety risks, cognitive, and hearing and visual impairment  Diet and physical activities  Evidence for depression or mood disorders  The patient's weight, height, BMI, and visual acuity have been recorded in the chart. I have made referrals, counseling, and provided education to the patient based on review of the above and I have provided the patient with a written personalized care plan for preventive services.

## 2015-09-21 NOTE — Assessment & Plan Note (Signed)
Improves with propanolol will continue

## 2015-09-21 NOTE — Patient Instructions (Addendum)
Flu shot given No change to medications  F/U 4 months

## 2015-09-21 NOTE — Assessment & Plan Note (Signed)
Counseled on tobacco cessation but he is not one of quit discussed the importance of not smoking around his oxygen

## 2015-09-21 NOTE — Assessment & Plan Note (Signed)
At baseline today , continue oxygen therapy he does benefit from this

## 2015-09-22 LAB — CBC WITH DIFFERENTIAL/PLATELET
BASOS ABS: 60 {cells}/uL (ref 0–200)
BASOS PCT: 1 %
EOS ABS: 240 {cells}/uL (ref 15–500)
Eosinophils Relative: 4 %
HEMATOCRIT: 48.6 % (ref 38.5–50.0)
HEMOGLOBIN: 16.4 g/dL (ref 13.0–17.0)
Lymphocytes Relative: 18 %
Lymphs Abs: 1080 cells/uL (ref 850–3900)
MCH: 31.1 pg (ref 27.0–33.0)
MCHC: 33.7 g/dL (ref 32.0–36.0)
MCV: 92.2 fL (ref 80.0–100.0)
MONO ABS: 900 {cells}/uL (ref 200–950)
MONOS PCT: 15 %
MPV: 12 fL (ref 7.5–12.5)
NEUTROS ABS: 3720 {cells}/uL (ref 1500–7800)
Neutrophils Relative %: 62 %
PLATELETS: 338 10*3/uL (ref 140–400)
RBC: 5.27 MIL/uL (ref 4.20–5.80)
RDW: 15 % (ref 11.0–15.0)
WBC: 6 10*3/uL (ref 3.8–10.8)

## 2015-09-22 LAB — COMPREHENSIVE METABOLIC PANEL
ALBUMIN: 4 g/dL (ref 3.6–5.1)
ALK PHOS: 122 U/L — AB (ref 40–115)
ALT: 9 U/L (ref 9–46)
AST: 14 U/L (ref 10–35)
BUN: 12 mg/dL (ref 7–25)
CALCIUM: 9.5 mg/dL (ref 8.6–10.3)
CHLORIDE: 106 mmol/L (ref 98–110)
CO2: 28 mmol/L (ref 20–31)
Creat: 1.07 mg/dL (ref 0.70–1.18)
Glucose, Bld: 54 mg/dL — ABNORMAL LOW (ref 70–99)
POTASSIUM: 4.6 mmol/L (ref 3.5–5.3)
Sodium: 144 mmol/L (ref 135–146)
TOTAL PROTEIN: 6.8 g/dL (ref 6.1–8.1)
Total Bilirubin: 0.3 mg/dL (ref 0.2–1.2)

## 2015-09-22 LAB — LIPID PANEL
CHOL/HDL RATIO: 4.7 ratio (ref <=5.0)
CHOLESTEROL: 198 mg/dL (ref 125–200)
HDL: 42 mg/dL (ref >=40)
LDL Cholesterol: 128 mg/dL (ref <130)
TRIGLYCERIDES: 138 mg/dL (ref <150)
VLDL: 28 mg/dL (ref <30)

## 2015-09-26 ENCOUNTER — Other Ambulatory Visit: Payer: Self-pay | Admitting: Family Medicine

## 2015-09-27 ENCOUNTER — Telehealth: Payer: Self-pay | Admitting: *Deleted

## 2015-09-27 NOTE — Telephone Encounter (Signed)
Patient wife [name] called and was inquiring about the patient lab results . She is requesting a call back. Her number is [phone]. Please advise. Thank you

## 2015-09-29 NOTE — Telephone Encounter (Signed)
Please see lab results.

## 2015-09-30 ENCOUNTER — Other Ambulatory Visit: Payer: Self-pay | Admitting: Family Medicine

## 2015-09-30 NOTE — Telephone Encounter (Signed)
Ok to refill??  Last office visit 09/21/2015.  Last refill 07/05/2015, #2 refills.

## 2015-09-30 NOTE — Telephone Encounter (Signed)
okay

## 2015-09-30 NOTE — Telephone Encounter (Signed)
Medication called to pharmacy. 

## 2015-10-01 DIAGNOSIS — J449 Chronic obstructive pulmonary disease, unspecified: Secondary | ICD-10-CM | POA: Diagnosis not present

## 2015-10-09 DIAGNOSIS — J449 Chronic obstructive pulmonary disease, unspecified: Secondary | ICD-10-CM | POA: Diagnosis not present

## 2015-10-27 ENCOUNTER — Emergency Department (HOSPITAL_COMMUNITY)
Admission: EM | Admit: 2015-10-27 | Discharge: 2015-10-27 | Disposition: A | Discharge status: Home / Self Care | Payer: Commercial Managed Care - HMO | Attending: Emergency Medicine | Admitting: Emergency Medicine

## 2015-10-27 ENCOUNTER — Emergency Department (HOSPITAL_COMMUNITY): Payer: Commercial Managed Care - HMO

## 2015-10-27 ENCOUNTER — Encounter (HOSPITAL_COMMUNITY): Payer: Self-pay | Admitting: *Deleted

## 2015-10-27 DIAGNOSIS — I10 Essential (primary) hypertension: Secondary | ICD-10-CM | POA: Insufficient documentation

## 2015-10-27 DIAGNOSIS — R0682 Tachypnea, not elsewhere classified: Secondary | ICD-10-CM | POA: Diagnosis not present

## 2015-10-27 DIAGNOSIS — Z791 Long term (current) use of non-steroidal anti-inflammatories (NSAID): Secondary | ICD-10-CM | POA: Diagnosis not present

## 2015-10-27 DIAGNOSIS — Z85828 Personal history of other malignant neoplasm of skin: Secondary | ICD-10-CM | POA: Insufficient documentation

## 2015-10-27 DIAGNOSIS — Z79899 Other long term (current) drug therapy: Secondary | ICD-10-CM | POA: Diagnosis not present

## 2015-10-27 DIAGNOSIS — J441 Chronic obstructive pulmonary disease with (acute) exacerbation: Secondary | ICD-10-CM | POA: Diagnosis not present

## 2015-10-27 DIAGNOSIS — R05 Cough: Secondary | ICD-10-CM | POA: Diagnosis not present

## 2015-10-27 DIAGNOSIS — R0602 Shortness of breath: Secondary | ICD-10-CM | POA: Diagnosis not present

## 2015-10-27 DIAGNOSIS — F17210 Nicotine dependence, cigarettes, uncomplicated: Secondary | ICD-10-CM | POA: Diagnosis not present

## 2015-10-27 LAB — BASIC METABOLIC PANEL
ANION GAP: 5 (ref 5–15)
BUN: 11 mg/dL (ref 6–20)
CALCIUM: 9.1 mg/dL (ref 8.9–10.3)
CO2: 36 mmol/L — AB (ref 22–32)
CREATININE: 0.82 mg/dL (ref 0.61–1.24)
Chloride: 101 mmol/L (ref 101–111)
GFR calc Af Amer: >60 mL/min (ref >60)
GFR calc non Af Amer: >60 mL/min (ref >60)
GLUCOSE: 91 mg/dL (ref 65–99)
Potassium: 4.1 mmol/L (ref 3.5–5.1)
Sodium: 142 mmol/L (ref 135–145)

## 2015-10-27 LAB — CBC
HCT: 48.2 % (ref 39.0–52.0)
HEMOGLOBIN: 15.7 g/dL (ref 13.0–17.0)
MCH: 31.5 pg (ref 26.0–34.0)
MCHC: 32.6 g/dL (ref 30.0–36.0)
MCV: 96.8 fL (ref 78.0–100.0)
Platelets: 171 10*3/uL (ref 150–400)
RBC: 4.98 MIL/uL (ref 4.22–5.81)
RDW: 13.4 % (ref 11.5–15.5)
WBC: 4.7 10*3/uL (ref 4.0–10.5)

## 2015-10-27 MED ORDER — PREDNISONE 20 MG PO TABS: ORAL_TABLET | ORAL | 0 refills | Status: DC

## 2015-10-27 MED ORDER — ALBUTEROL SULFATE HFA 108 (90 BASE) MCG/ACT IN AERS: 1.0000 | INHALATION_SPRAY | Freq: Four times a day (QID) | RESPIRATORY_TRACT | 0 refills | Status: AC | PRN

## 2015-10-27 MED ORDER — ALBUTEROL SULFATE HFA 108 (90 BASE) MCG/ACT IN AERS
2.0000 | INHALATION_SPRAY | Freq: Once | RESPIRATORY_TRACT | Status: AC
  Administered 2015-10-27: 2 via RESPIRATORY_TRACT
  Filled 2015-10-27: qty 6.7

## 2015-10-27 MED ORDER — PREDNISONE 20 MG PO TABS
40.0000 mg | ORAL_TABLET | Freq: Once | ORAL | Status: AC
  Administered 2015-10-27: 40 mg via ORAL
  Filled 2015-10-27: qty 2

## 2015-10-27 MED ORDER — IPRATROPIUM-ALBUTEROL 0.5-2.5 (3) MG/3ML IN SOLN
3.0000 mL | Freq: Once | RESPIRATORY_TRACT | Status: AC
  Administered 2015-10-27: 3 mL via RESPIRATORY_TRACT
  Filled 2015-10-27: qty 3

## 2015-10-27 NOTE — ED Triage Notes (Signed)
Pt comes in by EMS from home for shortness of breath starting 2 weeks ago. EMS has given 2 breathing treatments (5 mg Albuterol). Pt denies any pain at this time.

## 2015-10-27 NOTE — ED Provider Notes (Signed)
[facility] DEPT Provider Note   CSN: [id number] Arrival date & time: 10/27/15  1621     History   Chief Complaint Chief Complaint  Patient presents with  . Shortness of Breath    HPI [name] is a 75 y.o. male.  HPI  75 year old male presents with shortness of breath. Patient has a history of COPD and feels like this is an exacerbation. States it started 2 nights ago. He was admitted to the hospital several months ago and since that has been diffusely weak but this has not been worsening. However the shortness of breath is acute on chronic. He wears 4 L of oxygen all the time. Takes nightly nebulizers but has not been increasing this at home. Has chronic cough with clear sputum that is unchanged. No fevers. No chest pain or leg swelling. Given to up-year-old treatments by EMS and states he feels better and that his breathing is basically baseline.  Past Medical History:  Diagnosis Date  . Anxiety   . Arthritis   . Cancer ([place])    skin  . Chest pain   . COPD (chronic obstructive pulmonary disease) ([place])    Intubated on Vent 2013[facility]  . GERD (gastroesophageal reflux disease)   . Hyperlipidemia   . Hypertension   . Lung disease   . PVD (peripheral vascular disease) ([place])    RLE ABI 0.8    Patient Active Problem List   Diagnosis Date Noted  . 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 Tremor 07/17/2015  . GERD (gastroesophageal reflux disease) 03/21/2015  . Generalized weakness 04/20/2014  . Gait instability 04/20/2014  . Acute sinusitis 04/20/2014  . COPD exacerbation ([place]) 03/31/2014  . Chronic respiratory failure with hypoxia ([place]) 08/19/2013  . AK (actinic keratosis) 07/29/2013  . Atypical chest pain 07/29/2013  . Decreased hearing 11/03/2012  . Leg cramps 07/04/2012  . OA (osteoarthritis) 04/25/2012  . Impaired mobility 04/25/2012  . Insomnia 03/29/2012  . Tobacco abuse 04/05/2011  . COPD (chronic obstructive pulmonary disease) ([place])  03/14/2011  . Essential hypertension, benign 03/14/2011  . Hyperlipidemia 03/14/2011  . PVD (peripheral vascular disease) ([place]) 03/14/2011  . Depression with anxiety 03/14/2011  . Joint pain 03/14/2011  . Skin cancer 03/14/2011    Past Surgical History:  Procedure Laterality Date  . CATARACT EXTRACTION     left eye       Home Medications    Prior to Admission medications   Medication Sig Start Date End Date Taking? Authorizing Provider  ALPRAZolam Duanne Moron) 1 MG tablet Take 1 mg by mouth 3 (three) times daily as needed for anxiety.   Yes Historical Provider, MD  amLODipine (NORVASC) 10 MG tablet Take 10 mg by mouth daily.   Yes Historical Provider, MD  budesonide-formoterol (SYMBICORT) 160-4.5 MCG/ACT inhaler Inhale 2 puffs into the lungs 2 (two) times daily.   Yes Historical Provider, MD  FLUoxetine (PROZAC) 40 MG capsule Take 40 mg by mouth daily.   Yes Historical Provider, MD  ibuprofen (ADVIL,MOTRIN) 200 MG tablet Take 200 mg by mouth every 6 (six) hours as needed for headache, mild pain or moderate pain. For pain    Yes Historical Provider, MD  loratadine (CLARITIN) 10 MG tablet Take 1 tablet (10 mg total) by mouth daily. 07/06/15  Yes Alycia Rossetti, MD  nitroGLYCERIN (NITROSTAT) 0.4 MG SL tablet Place 0.4 mg under the tongue every 5 (five) minutes as needed for chest pain.   Yes Historical Provider, MD  omeprazole (PRILOSEC) 20 MG capsule Take 1 capsule (20 mg total) by mouth daily. 07/15/15  Yes Alycia Rossetti, MD  propranolol (INDERAL) 10 MG tablet Take 10 mg by mouth 2 (two) times daily.   Yes Historical Provider, MD  rosuvastatin (CRESTOR) 10 MG tablet Take 1 tablet (10 mg total) by mouth daily. 08/04/15  Yes Alycia Rossetti, MD  tiotropium (SPIRIVA) 18 MCG inhalation capsule Place 18 mcg into inhaler and inhale daily.   Yes Historical Provider, MD  vitamin B-12 (CYANOCOBALAMIN) 1000 MCG tablet Take 1,000 mcg by mouth daily.   Yes Historical Provider, MD  albuterol  (PROVENTIL HFA;VENTOLIN HFA) 108 (90 Base) MCG/ACT inhaler Inhale 1-2 puffs into the lungs every 6 (six) hours as needed for wheezing or shortness of breath. 10/27/15   Sherwood Gambler, MD  predniSONE (DELTASONE) 20 MG tablet 2 tabs po daily x 4 days 10/28/15   Sherwood Gambler, MD    Family History No family history on file.  Social History Social History  Substance Use Topics  . Smoking status: Current Some Day Smoker    Packs/day: 1.00    Types: Cigarettes, Cigars  . Smokeless tobacco: Current User    Types: Snuff  . Alcohol use No     Allergies   Review of patient's allergies indicates no known allergies.   Review of Systems Review of Systems  Constitutional: Negative for fever.  HENT: Negative for congestion.   Respiratory: Positive for cough and shortness of breath.   Cardiovascular: Negative for chest pain and leg swelling.  Neurological: Positive for weakness (for 4-5 months).  All other systems reviewed and are negative.    Physical Exam Updated Vital Signs BP 117/75   Pulse 60   Temp 98.1 F (36.7 C) (Oral)   Resp 17   Ht 5\' 7"  (1.702 m)   Wt 132 lb (59.9 kg)   SpO2 97%   BMI 20.67 kg/m   Physical Exam  Constitutional: He is oriented to person, place, and time. He appears well-developed and well-nourished. No distress.  HENT:  Head: Normocephalic and atraumatic.  Right Ear: External ear normal.  Left Ear: External ear normal.  Nose: Nose normal.  Eyes: Right eye exhibits no discharge. Left eye exhibits no discharge.  Neck: Neck supple.  Cardiovascular: Normal rate, regular rhythm and normal heart sounds.   Pulmonary/Chest: Effort normal. No accessory muscle usage. No tachypnea. He has wheezes (faint, expiratory).  Abdominal: Soft. There is no tenderness.  Musculoskeletal: He exhibits no edema.  Neurological: He is alert and oriented to person, place, and time.  Skin: Skin is warm and dry. He is not diaphoretic.  Nursing note and vitals  reviewed.    ED Treatments / Results  Labs (all labs ordered are listed, but only abnormal results are displayed) Labs Reviewed  BASIC METABOLIC PANEL - Abnormal; Notable for the following:       Result Value   CO2 36 (*)    All other components within normal limits  CBC    EKG  EKG Interpretation  Date/Time:  Thursday October 27 2015 16:39:16 EDT Ventricular Rate:  65 PR Interval:    QRS Duration: 110 QT Interval:  412 QTC Calculation: 429 R Axis:   89 Text Interpretation:  Sinus rhythm Multiform ventricular premature complexes Biatrial enlargement Borderline right axis deviation Nonspecific repol abnormality, diffuse leads Minimal ST elevation, inferior leads wandering baseline limits interpretation no significant change since 2016 Confirmed by Avea Mcgowen MD, Alexis Reber [phone]) on 10/27/2015 5:13:49 PM  Radiology Dg Chest 2 View  Result Date: 10/27/2015 CLINICAL DATA:  Shortness of breath with productive cough. EXAM: CHEST  2 VIEW COMPARISON:  Chest radiograph 04/19/2014. CT 08/18/2013. Chest radiograph 04/04/2011 FINDINGS: There is biapical scarring. Again noted is a large bleb at the right lung apex. Otherwise, the lungs are clear. No large pleural effusions. Heart and mediastinum are within normal limits. The trachea is midline. Atherosclerotic calcifications at the aortic arch. IMPRESSION: No active cardiopulmonary disease. Biapical scarring. This scarring is similar to an exam on 04/04/2011. Electronically Signed   By: Markus Daft M.D.   On: 10/27/2015 17:08    Procedures Procedures (including critical care time)  Medications Ordered in ED Medications  predniSONE (DELTASONE) tablet 40 mg (40 mg Oral Given 10/27/15 1803)  ipratropium-albuterol (DUONEB) 0.5-2.5 (3) MG/3ML nebulizer solution 3 mL (3 mLs Nebulization Given 10/27/15 1822)  albuterol (PROVENTIL HFA;VENTOLIN HFA) 108 (90 Base) MCG/ACT inhaler 2 puff (2 puffs Inhalation Given 10/27/15 1856)     Initial  Impression / Assessment and Plan / ED Course  I have reviewed the triage vital signs and the nursing notes.  Pertinent labs & imaging results that were available during my care of the patient were reviewed by me and considered in my medical decision making (see chart for details).  Clinical Course  Comment By Time  Will give a breathing treatment, prednisone, and check basic labs but I think his weakness is more chronic. CXR negative. Sherwood Gambler, MD 10/12 1729  Patient is feeling better. No hypoxia on his chronic O2 and no increased work of breathing at this time. Labs are benign. Family states that his nebulizer "is on the Toney Reil". Thus we'll give an albuterol inhaler and an encouraged him to use this every 4 hours and discharge with steroid burst. Sherwood Gambler, MD 10/12 1836    Final Clinical Impressions(s) / ED Diagnoses   Final diagnoses:  COPD exacerbation [facility])    New Prescriptions Discharge Medication List as of 10/27/2015  6:37 PM    START taking these medications   Details  predniSONE (DELTASONE) 20 MG tablet 2 tabs po daily x 4 days, Print         Sherwood Gambler, MD 10/28/15 0005

## 2015-10-27 NOTE — ED Notes (Signed)
Pt reports being on 4L [place] at home, pt was 88% on RA. Pt placed on 4L and O2 saturation rose to 92%.

## 2015-10-28 ENCOUNTER — Other Ambulatory Visit: Payer: Self-pay | Admitting: *Deleted

## 2015-10-28 MED ORDER — ALBUTEROL SULFATE (2.5 MG/3ML) 0.083% IN NEBU: 2.5000 mg | INHALATION_SOLUTION | Freq: Four times a day (QID) | RESPIRATORY_TRACT | 1 refills | Status: AC | PRN

## 2015-10-28 NOTE — Telephone Encounter (Signed)
Patient spouse in office.   Reports that patient seen in ER for COPD exacerbation.   Requested order for neb machine and neb tx.   Handwritten prescription given for DME.   Prescription sent to pharmacy for neb tx.

## 2015-10-31 DIAGNOSIS — J449 Chronic obstructive pulmonary disease, unspecified: Secondary | ICD-10-CM | POA: Diagnosis not present

## 2015-11-01 ENCOUNTER — Ambulatory Visit: Payer: Commercial Managed Care - HMO | Admitting: Family Medicine

## 2015-11-08 DIAGNOSIS — J449 Chronic obstructive pulmonary disease, unspecified: Secondary | ICD-10-CM | POA: Diagnosis not present

## 2015-11-16 ENCOUNTER — Ambulatory Visit: Payer: Commercial Managed Care - HMO | Admitting: Family Medicine

## 2015-11-22 ENCOUNTER — Other Ambulatory Visit: Payer: Self-pay | Admitting: Family Medicine

## 2015-11-22 NOTE — Telephone Encounter (Signed)
Medication refilled per protocol. 

## 2015-12-01 DIAGNOSIS — J449 Chronic obstructive pulmonary disease, unspecified: Secondary | ICD-10-CM | POA: Diagnosis not present

## 2015-12-09 DIAGNOSIS — J449 Chronic obstructive pulmonary disease, unspecified: Secondary | ICD-10-CM | POA: Diagnosis not present

## 2015-12-11 ENCOUNTER — Other Ambulatory Visit: Payer: Self-pay | Admitting: Family Medicine

## 2015-12-31 DIAGNOSIS — J449 Chronic obstructive pulmonary disease, unspecified: Secondary | ICD-10-CM | POA: Diagnosis not present

## 2016-01-02 ENCOUNTER — Telehealth: Payer: Self-pay | Admitting: *Deleted

## 2016-01-02 MED ORDER — ALPRAZOLAM 1 MG PO TABS: 1.0000 mg | ORAL_TABLET | Freq: Three times a day (TID) | ORAL | 1 refills | Status: DC | PRN

## 2016-01-02 MED ORDER — FLUOXETINE HCL 40 MG PO CAPS: 40.0000 mg | ORAL_CAPSULE | Freq: Every day | ORAL | 3 refills | Status: AC

## 2016-01-02 NOTE — Telephone Encounter (Signed)
Medication called to pharmacy. 

## 2016-01-02 NOTE — Telephone Encounter (Signed)
Received call from patient wife, [name].   Requested refill on Prozac and xanax.   Refill appropriate and filled per protocol for Prozac.   Ok to refill Xanax??  Last office visit 09/21/2015.  Last refill 09/30/2015, #2 refills.

## 2016-01-02 NOTE — Telephone Encounter (Signed)
okay

## 2016-01-05 ENCOUNTER — Telehealth: Payer: Self-pay | Admitting: Family Medicine

## 2016-01-05 ENCOUNTER — Telehealth: Payer: Self-pay | Admitting: *Deleted

## 2016-01-05 MED ORDER — PERMETHRIN 1 % EX LOTN: 1.0000 "application " | TOPICAL_LOTION | Freq: Once | CUTANEOUS | 0 refills | Status: AC

## 2016-01-05 MED ORDER — IVERMECTIN 0.5 % EX LOTN: TOPICAL_LOTION | CUTANEOUS | 1 refills | Status: AC

## 2016-01-05 MED ORDER — ALPRAZOLAM 1 MG PO TABS: 1.0000 mg | ORAL_TABLET | Freq: Three times a day (TID) | ORAL | 1 refills | Status: AC | PRN

## 2016-01-05 NOTE — Telephone Encounter (Addendum)
Anselmo Rod is not covered by insurance.   Formulary alternative includes: Permethrin.   New prescription sent to pharmacy.

## 2016-01-05 NOTE — Telephone Encounter (Signed)
[name] called in states that the refill for patient's xanax was for only 30 pills instead of 90 for a months supply. She would like to know why only 30 pills were called in.  CB# [phone]

## 2016-01-05 NOTE — Telephone Encounter (Signed)
Received call from patient wife.   Reports that patient daughter has positive infestation with head lice. Requesting order for prescription strength Sklice.  Fellow MD made aware and approved prescription. Prescription sent to pharmacy.   Advised to change pillowcases, sheets, pajamas, and towels daily. Wash all linens using the hot water laundry cycle and the high heat drying cycle. Seal nonwashable items that have touched the head (such as stuffed animals and hair bands) in a plastic bag for two weeks. Wash combs and brushes in hot water each day to dislodge any lice and nits.

## 2016-01-05 NOTE — Telephone Encounter (Signed)
Reviewed patient chart. Patient has been receiving 90 tabs Q 30 days.   Call placed to pharmacy to correct prescription.   Patient will be able to pick up corrected prescription next week.

## 2016-01-05 NOTE — Addendum Note (Signed)
Addended by: Sheral Flow on: 01/05/2016 12:31 PM   Modules accepted: Orders

## 2016-01-08 DIAGNOSIS — J449 Chronic obstructive pulmonary disease, unspecified: Secondary | ICD-10-CM | POA: Diagnosis not present

## 2016-01-23 ENCOUNTER — Ambulatory Visit: Payer: Commercial Managed Care - HMO | Admitting: Family Medicine

## 2016-01-27 ENCOUNTER — Telehealth: Payer: Self-pay | Admitting: *Deleted

## 2016-01-27 NOTE — Telephone Encounter (Signed)
Received fax from [facility].   Reports that if patient has documented DM, CAD, or CHF, they will qualify to have frozen precooked meals delivered to home.   MD reviewed and states that patient does not have qualifying diagnosis. MD is concerned that patient may require community resources for nutrition such as meals on wheels or food banks.   Contacted patient wife [name] to discuss. States that patient has been receiving meals from [facility] for a while. Reports that he does not require community resources.

## 2016-01-30 ENCOUNTER — Ambulatory Visit: Payer: Commercial Managed Care - HMO | Admitting: Family Medicine

## 2016-01-31 DIAGNOSIS — J449 Chronic obstructive pulmonary disease, unspecified: Secondary | ICD-10-CM | POA: Diagnosis not present

## 2016-02-07 ENCOUNTER — Emergency Department (HOSPITAL_COMMUNITY): Payer: Medicare HMO

## 2016-02-07 ENCOUNTER — Ambulatory Visit: Payer: Commercial Managed Care - HMO | Admitting: Family Medicine

## 2016-02-07 ENCOUNTER — Encounter (HOSPITAL_COMMUNITY): Payer: Self-pay | Admitting: Emergency Medicine

## 2016-02-07 ENCOUNTER — Inpatient Hospital Stay (HOSPITAL_COMMUNITY)
Admission: EM | Admit: 2016-02-07 | Discharge: 2016-03-15 | DRG: 207 | Disposition: E | Payer: Medicare HMO | Attending: Internal Medicine | Admitting: Internal Medicine

## 2016-02-07 DIAGNOSIS — J439 Emphysema, unspecified: Secondary | ICD-10-CM | POA: Diagnosis not present

## 2016-02-07 DIAGNOSIS — J9601 Acute respiratory failure with hypoxia: Secondary | ICD-10-CM | POA: Diagnosis not present

## 2016-02-07 DIAGNOSIS — Z7189 Other specified counseling: Secondary | ICD-10-CM

## 2016-02-07 DIAGNOSIS — Z79899 Other long term (current) drug therapy: Secondary | ICD-10-CM | POA: Diagnosis not present

## 2016-02-07 DIAGNOSIS — Z7952 Long term (current) use of systemic steroids: Secondary | ICD-10-CM

## 2016-02-07 DIAGNOSIS — R0902 Hypoxemia: Secondary | ICD-10-CM

## 2016-02-07 DIAGNOSIS — Z85828 Personal history of other malignant neoplasm of skin: Secondary | ICD-10-CM

## 2016-02-07 DIAGNOSIS — R079 Chest pain, unspecified: Secondary | ICD-10-CM | POA: Diagnosis not present

## 2016-02-07 DIAGNOSIS — J441 Chronic obstructive pulmonary disease with (acute) exacerbation: Secondary | ICD-10-CM | POA: Diagnosis not present

## 2016-02-07 DIAGNOSIS — E872 Acidosis: Secondary | ICD-10-CM | POA: Diagnosis not present

## 2016-02-07 DIAGNOSIS — I10 Essential (primary) hypertension: Secondary | ICD-10-CM

## 2016-02-07 DIAGNOSIS — J449 Chronic obstructive pulmonary disease, unspecified: Secondary | ICD-10-CM | POA: Diagnosis not present

## 2016-02-07 DIAGNOSIS — R000 Tachycardia, unspecified: Secondary | ICD-10-CM | POA: Diagnosis not present

## 2016-02-07 DIAGNOSIS — R4182 Altered mental status, unspecified: Secondary | ICD-10-CM | POA: Diagnosis not present

## 2016-02-07 DIAGNOSIS — J189 Pneumonia, unspecified organism: Secondary | ICD-10-CM | POA: Diagnosis not present

## 2016-02-07 DIAGNOSIS — J989 Respiratory disorder, unspecified: Secondary | ICD-10-CM | POA: Diagnosis not present

## 2016-02-07 DIAGNOSIS — Z9981 Dependence on supplemental oxygen: Secondary | ICD-10-CM | POA: Diagnosis not present

## 2016-02-07 DIAGNOSIS — J9621 Acute and chronic respiratory failure with hypoxia: Secondary | ICD-10-CM | POA: Diagnosis not present

## 2016-02-07 DIAGNOSIS — R945 Abnormal results of liver function studies: Secondary | ICD-10-CM | POA: Diagnosis not present

## 2016-02-07 DIAGNOSIS — E870 Hyperosmolality and hypernatremia: Secondary | ICD-10-CM | POA: Diagnosis not present

## 2016-02-07 DIAGNOSIS — J9690 Respiratory failure, unspecified, unspecified whether with hypoxia or hypercapnia: Secondary | ICD-10-CM | POA: Diagnosis not present

## 2016-02-07 DIAGNOSIS — T501X5A Adverse effect of loop [high-ceiling] diuretics, initial encounter: Secondary | ICD-10-CM | POA: Diagnosis not present

## 2016-02-07 DIAGNOSIS — J209 Acute bronchitis, unspecified: Secondary | ICD-10-CM

## 2016-02-07 DIAGNOSIS — I739 Peripheral vascular disease, unspecified: Secondary | ICD-10-CM | POA: Diagnosis present

## 2016-02-07 DIAGNOSIS — I959 Hypotension, unspecified: Secondary | ICD-10-CM | POA: Diagnosis present

## 2016-02-07 DIAGNOSIS — M1990 Unspecified osteoarthritis, unspecified site: Secondary | ICD-10-CM | POA: Diagnosis present

## 2016-02-07 DIAGNOSIS — Z9842 Cataract extraction status, left eye: Secondary | ICD-10-CM

## 2016-02-07 DIAGNOSIS — J440 Chronic obstructive pulmonary disease with acute lower respiratory infection: Secondary | ICD-10-CM | POA: Diagnosis not present

## 2016-02-07 DIAGNOSIS — T380X5A Adverse effect of glucocorticoids and synthetic analogues, initial encounter: Secondary | ICD-10-CM | POA: Diagnosis not present

## 2016-02-07 DIAGNOSIS — R748 Abnormal levels of other serum enzymes: Secondary | ICD-10-CM | POA: Diagnosis not present

## 2016-02-07 DIAGNOSIS — I4891 Unspecified atrial fibrillation: Secondary | ICD-10-CM | POA: Diagnosis not present

## 2016-02-07 DIAGNOSIS — F419 Anxiety disorder, unspecified: Secondary | ICD-10-CM | POA: Diagnosis present

## 2016-02-07 DIAGNOSIS — R061 Stridor: Secondary | ICD-10-CM | POA: Diagnosis not present

## 2016-02-07 DIAGNOSIS — J438 Other emphysema: Secondary | ICD-10-CM | POA: Diagnosis not present

## 2016-02-07 DIAGNOSIS — Z9911 Dependence on respirator [ventilator] status: Secondary | ICD-10-CM | POA: Diagnosis not present

## 2016-02-07 DIAGNOSIS — R7989 Other specified abnormal findings of blood chemistry: Secondary | ICD-10-CM

## 2016-02-07 DIAGNOSIS — R7401 Elevation of levels of liver transaminase levels: Secondary | ICD-10-CM

## 2016-02-07 DIAGNOSIS — R509 Fever, unspecified: Secondary | ICD-10-CM

## 2016-02-07 DIAGNOSIS — E876 Hypokalemia: Secondary | ICD-10-CM | POA: Diagnosis not present

## 2016-02-07 DIAGNOSIS — J9622 Acute and chronic respiratory failure with hypercapnia: Secondary | ICD-10-CM | POA: Diagnosis not present

## 2016-02-07 DIAGNOSIS — Z66 Do not resuscitate: Secondary | ICD-10-CM | POA: Diagnosis not present

## 2016-02-07 DIAGNOSIS — I209 Angina pectoris, unspecified: Secondary | ICD-10-CM | POA: Diagnosis not present

## 2016-02-07 DIAGNOSIS — I248 Other forms of acute ischemic heart disease: Secondary | ICD-10-CM | POA: Diagnosis not present

## 2016-02-07 DIAGNOSIS — J9602 Acute respiratory failure with hypercapnia: Secondary | ICD-10-CM

## 2016-02-07 DIAGNOSIS — K219 Gastro-esophageal reflux disease without esophagitis: Secondary | ICD-10-CM | POA: Diagnosis present

## 2016-02-07 DIAGNOSIS — Z7951 Long term (current) use of inhaled steroids: Secondary | ICD-10-CM

## 2016-02-07 DIAGNOSIS — R778 Other specified abnormalities of plasma proteins: Secondary | ICD-10-CM

## 2016-02-07 DIAGNOSIS — E874 Mixed disorder of acid-base balance: Secondary | ICD-10-CM | POA: Diagnosis not present

## 2016-02-07 DIAGNOSIS — Z515 Encounter for palliative care: Secondary | ICD-10-CM | POA: Diagnosis not present

## 2016-02-07 DIAGNOSIS — R531 Weakness: Secondary | ICD-10-CM | POA: Diagnosis not present

## 2016-02-07 DIAGNOSIS — Y95 Nosocomial condition: Secondary | ICD-10-CM | POA: Diagnosis present

## 2016-02-07 DIAGNOSIS — E785 Hyperlipidemia, unspecified: Secondary | ICD-10-CM | POA: Diagnosis present

## 2016-02-07 DIAGNOSIS — R55 Syncope and collapse: Secondary | ICD-10-CM | POA: Diagnosis not present

## 2016-02-07 DIAGNOSIS — K8020 Calculus of gallbladder without cholecystitis without obstruction: Secondary | ICD-10-CM | POA: Diagnosis present

## 2016-02-07 DIAGNOSIS — F17210 Nicotine dependence, cigarettes, uncomplicated: Secondary | ICD-10-CM | POA: Diagnosis present

## 2016-02-07 DIAGNOSIS — R400 Somnolence: Secondary | ICD-10-CM | POA: Diagnosis present

## 2016-02-07 DIAGNOSIS — R739 Hyperglycemia, unspecified: Secondary | ICD-10-CM | POA: Diagnosis not present

## 2016-02-07 DIAGNOSIS — R404 Transient alteration of awareness: Secondary | ICD-10-CM | POA: Diagnosis not present

## 2016-02-07 DIAGNOSIS — R140 Abdominal distension (gaseous): Secondary | ICD-10-CM | POA: Diagnosis not present

## 2016-02-07 DIAGNOSIS — J984 Other disorders of lung: Secondary | ICD-10-CM | POA: Diagnosis not present

## 2016-02-07 DIAGNOSIS — R0602 Shortness of breath: Secondary | ICD-10-CM | POA: Diagnosis not present

## 2016-02-07 DIAGNOSIS — R740 Nonspecific elevation of levels of transaminase and lactic acid dehydrogenase [LDH]: Secondary | ICD-10-CM

## 2016-02-07 DIAGNOSIS — K56609 Unspecified intestinal obstruction, unspecified as to partial versus complete obstruction: Secondary | ICD-10-CM

## 2016-02-07 DIAGNOSIS — R5081 Fever presenting with conditions classified elsewhere: Secondary | ICD-10-CM | POA: Diagnosis not present

## 2016-02-07 LAB — URINALYSIS, ROUTINE W REFLEX MICROSCOPIC
Bilirubin Urine: NEGATIVE
Glucose, UA: NEGATIVE mg/dL
KETONES UR: 20 mg/dL — AB
LEUKOCYTES UA: NEGATIVE
Nitrite: NEGATIVE
Protein, ur: 100 mg/dL — AB
Specific Gravity, Urine: 1.015 (ref 1.005–1.030)
pH: 5 (ref 5.0–8.0)

## 2016-02-07 LAB — CBC WITH DIFFERENTIAL/PLATELET
BASOS PCT: 0 %
Basophils Absolute: 0 10*3/uL (ref 0.0–0.1)
Eosinophils Absolute: 0.1 10*3/uL (ref 0.0–0.7)
Eosinophils Relative: 1 %
HEMATOCRIT: 48.7 % (ref 39.0–52.0)
HEMOGLOBIN: 15.7 g/dL (ref 13.0–17.0)
LYMPHS ABS: 0.7 10*3/uL (ref 0.7–4.0)
LYMPHS PCT: 10 %
MCH: 31.7 pg (ref 26.0–34.0)
MCHC: 32.2 g/dL (ref 30.0–36.0)
MCV: 98.4 fL (ref 78.0–100.0)
MONOS PCT: 6 %
Monocytes Absolute: 0.4 10*3/uL (ref 0.1–1.0)
NEUTROS ABS: 6 10*3/uL (ref 1.7–7.7)
NEUTROS PCT: 84 %
Platelets: 154 10*3/uL (ref 150–400)
RBC: 4.95 MIL/uL (ref 4.22–5.81)
RDW: 14.4 % (ref 11.5–15.5)
WBC: 7.2 10*3/uL (ref 4.0–10.5)

## 2016-02-07 LAB — BLOOD GAS, ARTERIAL
ACID-BASE DEFICIT: 0.1 mmol/L (ref 0.0–2.0)
Acid-base deficit: 0.2 mmol/L (ref 0.0–2.0)
Bicarbonate: 22.6 mmol/L (ref 20.0–28.0)
Bicarbonate: 22.8 mmol/L (ref 20.0–28.0)
DRAWN BY: 21310
Drawn by: 234301
FIO2: 40
FIO2: 35
MECHVT: 450 mL
O2 Saturation: 99.2 %
O2 Saturation: 96.9 %
PEEP: 5 cmH2O
PEEP: 5 cmH2O
PH ART: 7.239 — AB (ref 7.350–7.450)
PH ART: 7.284 — AB (ref 7.350–7.450)
PO2 ART: 95.3 mmHg (ref 83.0–108.0)
Patient temperature: 37
RATE: 16 resp/min
RATE: 18 resp/min
VT: 430 mL
pCO2 arterial: 64.2 mmHg — ABNORMAL HIGH (ref 32.0–48.0)
pCO2 arterial: 55.8 mmHg — ABNORMAL HIGH (ref 32.0–48.0)
pO2, Arterial: 253 mmHg — ABNORMAL HIGH (ref 83.0–108.0)

## 2016-02-07 LAB — PROTIME-INR
INR: 1.02
PROTHROMBIN TIME: 13.4 s (ref 11.4–15.2)

## 2016-02-07 LAB — COMPREHENSIVE METABOLIC PANEL
ALBUMIN: 3.9 g/dL (ref 3.5–5.0)
ALK PHOS: 87 U/L (ref 38–126)
ALT: 10 U/L — ABNORMAL LOW (ref 17–63)
AST: 24 U/L (ref 15–41)
Anion gap: 8 (ref 5–15)
BILIRUBIN TOTAL: 0.5 mg/dL (ref 0.3–1.2)
BUN: 9 mg/dL (ref 6–20)
CALCIUM: 8.5 mg/dL — AB (ref 8.9–10.3)
CO2: 30 mmol/L (ref 22–32)
Chloride: 104 mmol/L (ref 101–111)
Creatinine, Ser: 0.91 mg/dL (ref 0.61–1.24)
GFR calc Af Amer: >60 mL/min (ref >60)
GFR calc non Af Amer: >60 mL/min (ref >60)
GLUCOSE: 104 mg/dL — AB (ref 65–99)
Potassium: 4.3 mmol/L (ref 3.5–5.1)
Sodium: 142 mmol/L (ref 135–145)
TOTAL PROTEIN: 6.9 g/dL (ref 6.5–8.1)

## 2016-02-07 LAB — I-STAT CHEM 8, ED
BUN: 10 mg/dL (ref 6–20)
CALCIUM ION: 1.1 mmol/L — AB (ref 1.15–1.40)
Chloride: 106 mmol/L (ref 101–111)
Creatinine, Ser: 0.8 mg/dL (ref 0.61–1.24)
Glucose, Bld: 120 mg/dL — ABNORMAL HIGH (ref 65–99)
HEMATOCRIT: 49 % (ref 39.0–52.0)
Hemoglobin: 16.7 g/dL (ref 13.0–17.0)
POTASSIUM: 6.3 mmol/L — AB (ref 3.5–5.1)
Sodium: 143 mmol/L (ref 135–145)
TCO2: 33 mmol/L (ref 0–100)

## 2016-02-07 LAB — MRSA PCR SCREENING: MRSA by PCR: POSITIVE — AB

## 2016-02-07 LAB — BRAIN NATRIURETIC PEPTIDE: B Natriuretic Peptide: 60 pg/mL (ref 0.0–100.0)

## 2016-02-07 LAB — I-STAT CG4 LACTIC ACID, ED: Lactic Acid, Venous: 0.7 mmol/L (ref 0.5–1.9)

## 2016-02-07 MED ORDER — SODIUM CHLORIDE 0.9 % IV BOLUS (SEPSIS)
250.0000 mL | Freq: Once | INTRAVENOUS | Status: AC
  Administered 2016-02-07: 250 mL via INTRAVENOUS

## 2016-02-07 MED ORDER — ENOXAPARIN SODIUM 40 MG/0.4ML ~~LOC~~ SOLN
40.0000 mg | SUBCUTANEOUS | Status: DC
  Administered 2016-02-07 – 2016-02-19 (×13): 40 mg via SUBCUTANEOUS
  Filled 2016-02-07 (×13): qty 0.4

## 2016-02-07 MED ORDER — METHYLPREDNISOLONE SODIUM SUCC 125 MG IJ SOLR
60.0000 mg | Freq: Two times a day (BID) | INTRAMUSCULAR | Status: DC
  Administered 2016-02-07: 60 mg via INTRAVENOUS
  Filled 2016-02-07: qty 2

## 2016-02-07 MED ORDER — PROPOFOL 1000 MG/100ML IV EMUL
5.0000 ug/kg/min | Freq: Once | INTRAVENOUS | Status: DC
  Filled 2016-02-07: qty 100

## 2016-02-07 MED ORDER — FENTANYL BOLUS VIA INFUSION
25.0000 ug | INTRAVENOUS | Status: DC | PRN
  Administered 2016-02-07 – 2016-02-09 (×3): 25 ug via INTRAVENOUS
  Filled 2016-02-07: qty 25

## 2016-02-07 MED ORDER — SODIUM CHLORIDE 0.9 % IV SOLN
INTRAVENOUS | Status: DC
  Administered 2016-02-07: 75 mL/h via INTRAVENOUS
  Administered 2016-02-07 – 2016-02-11 (×3): via INTRAVENOUS
  Administered 2016-02-12: 1000 mL via INTRAVENOUS
  Administered 2016-02-13 – 2016-02-14 (×4): via INTRAVENOUS

## 2016-02-07 MED ORDER — ACETAMINOPHEN 650 MG RE SUPP
650.0000 mg | Freq: Once | RECTAL | Status: AC
  Administered 2016-02-07: 650 mg via RECTAL

## 2016-02-07 MED ORDER — PROPOFOL 1000 MG/100ML IV EMUL
INTRAVENOUS | Status: AC
  Filled 2016-02-07: qty 100

## 2016-02-07 MED ORDER — SODIUM CHLORIDE 0.9 % IV SOLN: INTRAVENOUS | Status: AC

## 2016-02-07 MED ORDER — CHLORHEXIDINE GLUCONATE 0.12% ORAL RINSE (MEDLINE KIT)
15.0000 mL | Freq: Two times a day (BID) | OROMUCOSAL | Status: DC
  Administered 2016-02-07 – 2016-02-20 (×26): 15 mL via OROMUCOSAL

## 2016-02-07 MED ORDER — FENTANYL CITRATE (PF) 100 MCG/2ML IJ SOLN: 50.0000 ug | Freq: Once | INTRAMUSCULAR | Status: DC

## 2016-02-07 MED ORDER — LORAZEPAM 2 MG/ML IJ SOLN
2.0000 mg | Freq: Once | INTRAMUSCULAR | Status: AC
  Administered 2016-02-07: 2 mg via INTRAVENOUS

## 2016-02-07 MED ORDER — ALBUTEROL SULFATE (2.5 MG/3ML) 0.083% IN NEBU: 2.5000 mg | INHALATION_SOLUTION | RESPIRATORY_TRACT | Status: DC | PRN

## 2016-02-07 MED ORDER — DEXTROSE 5 % IV SOLN
INTRAVENOUS | Status: AC
  Filled 2016-02-07: qty 500

## 2016-02-07 MED ORDER — DEXTROSE 5 % IV SOLN
1.0000 g | INTRAVENOUS | Status: DC
  Administered 2016-02-08 – 2016-02-09 (×2): 1 g via INTRAVENOUS
  Filled 2016-02-07 (×2): qty 10

## 2016-02-07 MED ORDER — PROPOFOL 1000 MG/100ML IV EMUL
5.0000 ug/kg/min | Freq: Once | INTRAVENOUS | Status: AC
  Administered 2016-02-07: 10 ug/kg/min via INTRAVENOUS

## 2016-02-07 MED ORDER — ETOMIDATE 2 MG/ML IV SOLN
INTRAVENOUS | Status: AC | PRN
  Administered 2016-02-07: 20 mg via INTRAVENOUS

## 2016-02-07 MED ORDER — FENTANYL CITRATE (PF) 2500 MCG/50ML IJ SOLN
INTRAMUSCULAR | Status: AC
  Filled 2016-02-07: qty 50

## 2016-02-07 MED ORDER — ACETAMINOPHEN 325 MG RE SUPP
RECTAL | Status: AC
  Administered 2016-02-07: 21:00:00
  Filled 2016-02-07: qty 1

## 2016-02-07 MED ORDER — SODIUM CHLORIDE 0.9 % IV SOLN
25.0000 ug/h | INTRAVENOUS | Status: DC
  Administered 2016-02-07: 50 ug/h via INTRAVENOUS
  Administered 2016-02-08: 100 ug/h via INTRAVENOUS
  Administered 2016-02-08: 225 ug/h via INTRAVENOUS
  Administered 2016-02-09: 200 ug/h via INTRAVENOUS
  Administered 2016-02-10: 150 ug/h via INTRAVENOUS
  Administered 2016-02-11 – 2016-02-12 (×2): 250 ug/h via INTRAVENOUS
  Administered 2016-02-12: 300 ug/h via INTRAVENOUS
  Administered 2016-02-13 – 2016-02-18 (×11): 200 ug/h via INTRAVENOUS
  Administered 2016-02-19 – 2016-02-20 (×3): 100 ug/h via INTRAVENOUS
  Administered 2016-02-21: 400 ug/h via INTRAVENOUS
  Filled 2016-02-07 (×12): qty 50

## 2016-02-07 MED ORDER — DEXTROSE 5 % IV SOLN
500.0000 mg | INTRAVENOUS | Status: DC
  Administered 2016-02-07 – 2016-02-08 (×2): 500 mg via INTRAVENOUS
  Filled 2016-02-07 (×2): qty 500

## 2016-02-07 MED ORDER — LORAZEPAM 2 MG/ML IJ SOLN
INTRAMUSCULAR | Status: AC
  Filled 2016-02-07: qty 1

## 2016-02-07 MED ORDER — ROCURONIUM BROMIDE 50 MG/5ML IV SOLN
INTRAVENOUS | Status: AC | PRN
  Administered 2016-02-07: 60 mg via INTRAVENOUS

## 2016-02-07 MED ORDER — DEXTROSE 5 % IV SOLN
INTRAVENOUS | Status: AC
  Filled 2016-02-07: qty 10

## 2016-02-07 MED ORDER — SODIUM CHLORIDE 0.9 % IV BOLUS (SEPSIS)
500.0000 mL | INTRAVENOUS | Status: DC | PRN
  Administered 2016-02-07 – 2016-02-08 (×2): 500 mL via INTRAVENOUS
  Administered 2016-02-20: 250 mL via INTRAVENOUS
  Filled 2016-02-07 (×3): qty 500

## 2016-02-07 MED ORDER — ORAL CARE MOUTH RINSE
15.0000 mL | Freq: Four times a day (QID) | OROMUCOSAL | Status: DC
  Administered 2016-02-08 – 2016-02-20 (×49): 15 mL via OROMUCOSAL

## 2016-02-07 MED ORDER — FAMOTIDINE IN NACL 20-0.9 MG/50ML-% IV SOLN
20.0000 mg | Freq: Two times a day (BID) | INTRAVENOUS | Status: DC
  Administered 2016-02-07 – 2016-02-20 (×26): 20 mg via INTRAVENOUS
  Filled 2016-02-07 (×26): qty 50

## 2016-02-07 MED ORDER — PROPOFOL 10 MG/ML IV BOLUS: 20.0000 mg | Freq: Once | INTRAVENOUS | Status: DC

## 2016-02-07 MED ORDER — IPRATROPIUM-ALBUTEROL 0.5-2.5 (3) MG/3ML IN SOLN
3.0000 mL | Freq: Four times a day (QID) | RESPIRATORY_TRACT | Status: DC
  Administered 2016-02-07 – 2016-02-20 (×51): 3 mL via RESPIRATORY_TRACT
  Filled 2016-02-07 (×49): qty 3

## 2016-02-07 NOTE — ED Notes (Signed)
Dentures in cup with clothing in belonging bag, watch and necklace picked up by security

## 2016-02-07 NOTE — ED Triage Notes (Signed)
MD at the bedside, pt only responsive to stern rub

## 2016-02-07 NOTE — ED Notes (Signed)
Critical Lab Value  PH 7.23 PcO2 64.2 PO2 253 SpO2 99.2 Bicarb 22.3  Dr. Rogene Houston informed face to face. Dr in department.

## 2016-02-07 NOTE — ED Notes (Signed)
Tubed  with7 1/2 , 23 at the lip, good color change

## 2016-02-07 NOTE — ED Notes (Signed)
Pt waking up, family calling for help,  eyes open, rate change

## 2016-02-07 NOTE — ED Notes (Signed)
Calling hospitalist for temp and repeat abg

## 2016-02-07 NOTE — ED Triage Notes (Signed)
EMS bring pt in , SOB x 3 days, low grade temp at home, used 2L 02 at home, diminished lung sounds and wheezing, HX COPD, Gave neb and sol u medrol in route

## 2016-02-07 NOTE — ED Notes (Signed)
Family at beside, pt sleeping, calm, meds rate change

## 2016-02-07 NOTE — ED Notes (Signed)
Transported pt to CT with RT and tech

## 2016-02-07 NOTE — H&P (Signed)
[facility] History and Physical  [name] [id number] DOB: 1940/09/10 DOA: 02/12/2016  Referring physician: Dr. Rogene Houston PCP: Vic Blackbird, MD   Chief Complaint: Dyspnea  HPI: [name] is a 76 y.o. male with history of HTN, tobacco use and COPD came to ED this afternoon with SOB x 3 days, low grade fever at home.  Tried home O2 at 2 lpm.  Hx COPD was admitted in 2013 and 2015 with COPD flares, 2013 required intubation.  EMS gave neb and IV steroid en route. In ED patient was poorly responsive, only to sternal rub.  Family said that more than one family member had been "feeding him Xanax" to try and help with his breathing and assoc anxiety.  Required intubation in the ICU. ABG in ED was 7.23/ 64/ 253.  Started to fight vent in ED and was started on IV propofol. BP's dipped < 100 on propofol but now 110/ 80.   Asked to see for admission to ICU.    No hx from patient, intubated, no family here.    Past Medical History  Past Medical History:  Diagnosis Date  . Anxiety   . Arthritis   . Cancer ([place])    skin  . Chest pain   . COPD (chronic obstructive pulmonary disease) ([place])    [facility] Vent [facility]  . GERD (gastroesophageal reflux disease)   . Hyperlipidemia   . Hypertension   . Lung disease   . PVD (peripheral vascular disease) ([place])    RLE ABI 0.8   Past Surgical History  Past Surgical History:  Procedure Laterality Date  . CATARACT EXTRACTION     left eye   Family History No family history on file. Social History  reports that he has been smoking Cigarettes and Cigars.  He has been smoking about 1.00 pack per day. His smokeless tobacco use includes Snuff. He reports that he does not drink alcohol or use drugs. Allergies No Known Allergies Home medications Prior to Admission medications   Medication Sig Start Date End Date Taking? Authorizing Provider  albuterol (PROVENTIL HFA;VENTOLIN HFA) 108 (90 Base) MCG/ACT inhaler Inhale 1-2 puffs  into the lungs every 6 (six) hours as needed for wheezing or shortness of breath. 10/27/15  Yes Sherwood Gambler, MD  ALPRAZolam Duanne Moron) 1 MG tablet Take 1 tablet (1 mg total) by mouth 3 (three) times daily as needed for anxiety. 01/05/16  Yes Alycia Rossetti, MD  budesonide-formoterol [facility]) 160-4.5 MCG/ACT inhaler Inhale 2 puffs into the lungs 2 (two) times daily.   Yes Historical Provider, MD  FLUoxetine (PROZAC) 40 MG capsule Take 1 capsule (40 mg total) by mouth daily. 01/02/16  Yes Alycia Rossetti, MD  Ivermectin 0.5 % LOTN Apply thoroughly to dry hair and scalp; leave lotion on hair for 10 minutes, and then rinse with water. 01/05/16  Yes Susy Frizzle, MD  loratadine (CLARITIN) 10 MG tablet Take 1 tablet (10 mg total) by mouth daily. 07/06/15  Yes Alycia Rossetti, MD  omeprazole (PRILOSEC) 20 MG capsule take 1 capsule by mouth once daily 11/22/15  Yes Alycia Rossetti, MD  propranolol (INDERAL) 10 MG tablet take 1 tablet by mouth twice a day for TREMOR 11/22/15  Yes Alycia Rossetti, MD  rosuvastatin (CRESTOR) 10 MG tablet take 1 tablet by mouth once daily 12/12/15  Yes Alycia Rossetti, MD  tiotropium (SPIRIVA) 18 MCG inhalation capsule Place 18 mcg into inhaler and inhale daily.   Yes Historical  Provider, MD  albuterol (PROVENTIL) (2.5 MG/3ML) 0.083% nebulizer solution Take 3 mLs (2.5 mg total) by nebulization every 6 (six) hours as needed for wheezing or shortness of breath. 10/28/15   Alycia Rossetti, MD  amLODipine (NORVASC) 10 MG tablet Take 10 mg by mouth daily.    Historical Provider, MD  ibuprofen (ADVIL,MOTRIN) 200 MG tablet Take 200 mg by mouth every 6 (six) hours as needed for headache, mild pain or moderate pain. For pain     Historical Provider, MD  nitroGLYCERIN (NITROSTAT) 0.4 MG SL tablet Place 0.4 mg under the tongue every 5 (five) minutes as needed for chest pain.    Historical Provider, MD  predniSONE (DELTASONE) 20 MG tablet 2 tabs po daily x 4 days 10/28/15   Sherwood Gambler, MD  vitamin B-12 (CYANOCOBALAMIN) 1000 MCG tablet Take 1,000 mcg by mouth daily.    Historical Provider, MD   Liver Function Tests  Recent Labs Lab 01/26/2016 1559  AST 24  ALT 10*  ALKPHOS 87  BILITOT 0.5  PROT 6.9  ALBUMIN 3.9   No results for input(s): LIPASE, AMYLASE in the last 168 hours. CBC  Recent Labs Lab 02/02/2016 1523 02/06/2016 1559  WBC  --  7.2  NEUTROABS  --  6.0  HGB 16.7 15.7  HCT 49.0 48.7  MCV  --  98.4  PLT  --  123456   Basic Metabolic Panel  Recent Labs Lab 02/02/2016 1523 02/04/2016 1559  NA 143 142  K 6.3* 4.3  CL 106 104  CO2  --  30  GLUCOSE 120* 104*  BUN 10 9  CREATININE 0.80 0.91  CALCIUM  --  8.5*     Vitals:   02/03/2016 1900 01/27/2016 1915 02/13/2016 1930 01/17/2016 2007  BP: 133/80 94/67 91/71    Pulse: 102 103 103   Resp: 23 17 16    Temp: 100.6 F (38.1 C) 100.8 F (38.2 C) 100.9 F (38.3 C) 98.3 F (36.8 C)  TempSrc:    Axillary  SpO2: 96% 98% 99%   Weight:    57.6 kg (126 lb 15.8 oz)  Height:    5\' 8"  (1.727 m)   Exam: Gen chron ill-appearing WM elderly adult on vent, opens eyes to voice and follows simple commands No rash, cyanosis or gangrene Sclera anicteric, throat w ETT  No jvd or bruits Chest diffuse bilat exp wheezing, poor air movement throughout but is moving some air RRR no MRG Abd soft ntnd no mass or ascites +bs GU normal male w foley cath MS no joint effusions or deformity Ext no LE edema / no wounds or ulcers Neuro is groggy, on sedation IV, responsive  CXR (independ reviewed) > hyperinflation, no CHF or infiltrates Na 142 K 4.3  Cr 0.9  Alb 3.9  Hb 15.7  WBC 7k  BNP 60   LFT's ok   Assessment: 1. Acute on chronic resp failure/ vent dep resp failure - due to COPD exacerbation. +fever. Plan admit , IV steroids, IV abx for acute bronchitis / poss PNA, and nebs.  Vent support.   2. COPD  3. Tobacco history 4. HTN - hold meds, BP's soft here 5. Hypotension - NS bolus x 2 prn, start IVF at 100 /hr,  has room for volume 6. Anxiety  Plan - as above     [place] D [facility] Pager [phone]   If 7PM-7AM, please contact night-coverage [email or web address] Password [email or web address] 01/27/2016, 8:11 PM

## 2016-02-07 NOTE — ED Notes (Signed)
Lab and xray at the bedside 

## 2016-02-07 NOTE — ED Notes (Signed)
Pt starting to fight vent, MD aware

## 2016-02-07 NOTE — ED Notes (Signed)
Family at the bedside.

## 2016-02-07 NOTE — ED Notes (Signed)
Floor unable to take report at this time.

## 2016-02-07 NOTE — ED Notes (Signed)
Moved pt to room 1 , RT at the bedside

## 2016-02-07 NOTE — Progress Notes (Signed)
[facility] Progress Note Patient Name: ZAHIN OHANLON DOB: 11-13-1940 MRN: [id number]   Date of Service  02/11/2016  HPI/Events of Note  No gi prophylaxis  eICU Interventions  Ordered pepcid.      Intervention Category Intermediate Interventions: Respiratory distress - evaluation and management  Laverle Hobby 02/02/2016, 9:27 PM

## 2016-02-07 NOTE — ED Provider Notes (Signed)
[facility] DEPT Provider Note   CSN: [id number] Arrival date & time: 01/19/2016  1452     History   Chief Complaint Chief Complaint  Patient presents with  . Shortness of Breath    HPI [name] is a 76 y.o. male.  Patient with known history of COPD. Still smokes. Patient brought in by EMS. At home was having difficulty breathing. Patient was given 3 nebulizer treatments and site Medrol. Upon arrival patient was unresponsive. Patient is on home oxygen family states he's been using a 3 L but sometimes increases it. Also possible that he may have had a double or triple dose of Xanax by accident today. Patient had an upper respiratory infection the past 2 days.      Past Medical History:  Diagnosis Date  . Anxiety   . Arthritis   . Cancer ([place])    skin  . Chest pain   . COPD (chronic obstructive pulmonary disease) ([place])    [facility]  . GERD (gastroesophageal reflux disease)   . Hyperlipidemia   . Hypertension   . Lung disease   . PVD (peripheral vascular disease) ([place])    RLE ABI 0.8    Patient Active Problem List   Diagnosis Date Noted  . 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 Tremor 07/17/2015  . GERD (gastroesophageal reflux disease) 03/21/2015  . Generalized weakness 04/20/2014  . Gait instability 04/20/2014  . Acute sinusitis 04/20/2014  . COPD exacerbation ([place]) 03/31/2014  . Chronic respiratory failure with hypoxia ([place]) 08/19/2013  . AK (actinic keratosis) 07/29/2013  . Atypical chest pain 07/29/2013  . Decreased hearing 11/03/2012  . Leg cramps 07/04/2012  . OA (osteoarthritis) 04/25/2012  . Impaired mobility 04/25/2012  . Insomnia 03/29/2012  . Tobacco abuse 04/05/2011  . COPD (chronic obstructive pulmonary disease) ([place]) 03/14/2011  . Essential hypertension, benign 03/14/2011  . Hyperlipidemia 03/14/2011  . PVD (peripheral vascular disease) ([place]) 03/14/2011  . Depression with anxiety 03/14/2011  .  Joint pain 03/14/2011  . Skin cancer 03/14/2011    Past Surgical History:  Procedure Laterality Date  . CATARACT EXTRACTION     left eye       Home Medications    Prior to Admission medications   Medication Sig Start Date End Date Taking? Authorizing Provider  albuterol (PROVENTIL HFA;VENTOLIN HFA) 108 (90 Base) MCG/ACT inhaler Inhale 1-2 puffs into the lungs every 6 (six) hours as needed for wheezing or shortness of breath. 10/27/15   Sherwood Gambler, MD  albuterol (PROVENTIL) (2.5 MG/3ML) 0.083% nebulizer solution Take 3 mLs (2.5 mg total) by nebulization every 6 (six) hours as needed for wheezing or shortness of breath. 10/28/15   Alycia Rossetti, MD  ALPRAZolam Duanne Moron) 1 MG tablet Take 1 tablet (1 mg total) by mouth 3 (three) times daily as needed for anxiety. 01/05/16   Alycia Rossetti, MD  amLODipine (NORVASC) 10 MG tablet Take 10 mg by mouth daily.    Historical Provider, MD  budesonide-formoterol (SYMBICORT) 160-4.5 MCG/ACT inhaler Inhale 2 puffs into the lungs 2 (two) times daily.    Historical Provider, MD  FLUoxetine (PROZAC) 40 MG capsule Take 1 capsule (40 mg total) by mouth daily. 01/02/16   Alycia Rossetti, MD  ibuprofen (ADVIL,MOTRIN) 200 MG tablet Take 200 mg by mouth every 6 (six) hours as needed for headache, mild pain or moderate pain. For pain     Historical Provider, MD  Ivermectin 0.5 % LOTN Apply thoroughly  to dry hair and scalp; leave lotion on hair for 10 minutes, and then rinse with water. 01/05/16   Susy Frizzle, MD  loratadine (CLARITIN) 10 MG tablet Take 1 tablet (10 mg total) by mouth daily. 07/06/15   Alycia Rossetti, MD  nitroGLYCERIN (NITROSTAT) 0.4 MG SL tablet Place 0.4 mg under the tongue every 5 (five) minutes as needed for chest pain.    Historical Provider, MD  omeprazole (PRILOSEC) 20 MG capsule take 1 capsule by mouth once daily 11/22/15   Alycia Rossetti, MD  predniSONE (DELTASONE) 20 MG tablet 2 tabs po daily x 4 days 10/28/15   Sherwood Gambler, MD  propranolol (INDERAL) 10 MG tablet Take 10 mg by mouth 2 (two) times daily.    Historical Provider, MD  propranolol (INDERAL) 10 MG tablet take 1 tablet by mouth twice a day for TREMOR 11/22/15   Alycia Rossetti, MD  rosuvastatin (CRESTOR) 10 MG tablet take 1 tablet by mouth once daily 12/12/15   Alycia Rossetti, MD  tiotropium (SPIRIVA) 18 MCG inhalation capsule Place 18 mcg into inhaler and inhale daily.    Historical Provider, MD  vitamin B-12 (CYANOCOBALAMIN) 1000 MCG tablet Take 1,000 mcg by mouth daily.    Historical Provider, MD    Family History No family history on file.  Social History Social History  Substance Use Topics  . Smoking status: Current Some Day Smoker    Packs/day: 1.00    Types: Cigarettes, Cigars  . Smokeless tobacco: Current User    Types: Snuff  . Alcohol use No     Allergies   Patient has no known allergies.   Review of Systems Review of Systems  Unable to perform ROS: Mental status change     Physical Exam Updated Vital Signs BP 131/82   Pulse 108   Temp 98.8 F (37.1 C)   Resp 14   Wt 59.9 kg   SpO2 100%   BMI 20.67 kg/m   Physical Exam  Constitutional: He appears well-developed and well-nourished. He appears distressed.  HENT:  Mucous membranes dry  Upper and lower dentures.  Eyes: Conjunctivae are normal. Pupils are equal, round, and reactive to light.  Neck: Neck supple.  Cardiovascular:  Tachycardic  Pulmonary/Chest: He is in respiratory distress. He has wheezes.  Abdominal: Soft. He exhibits no distension.  Musculoskeletal: He exhibits no edema.  Neurological:  Unresponsive to painful stimuli.  Skin: Skin is warm. Capillary refill takes less than 2 seconds. He is not diaphoretic.  Nursing note and vitals reviewed.    ED Treatments / Results  Labs (all labs ordered are listed, but only abnormal results are displayed) Labs Reviewed  COMPREHENSIVE METABOLIC PANEL - Abnormal; Notable for the following:        Result Value   Glucose, Bld 104 (*)    Calcium 8.5 (*)    ALT 10 (*)    All other components within normal limits  BLOOD GAS, ARTERIAL - Abnormal; Notable for the following:    pH, Arterial 7.239 (*)    pCO2 arterial 64.2 (*)    pO2, Arterial 253.00 (*)    All other components within normal limits  URINALYSIS, ROUTINE W REFLEX MICROSCOPIC - Abnormal; Notable for the following:    APPearance HAZY (*)    Hgb urine dipstick SMALL (*)    Ketones, ur 20 (*)    Protein, ur 100 (*)    Bacteria, UA RARE (*)    All other components within  normal limits  I-STAT CHEM 8, ED - Abnormal; Notable for the following:    Potassium 6.3 (*)    Glucose, Bld 120 (*)    Calcium, Ion 1.10 (*)    All other components within normal limits  BRAIN NATRIURETIC PEPTIDE  CBC WITH DIFFERENTIAL/PLATELET  PROTIME-INR  I-STAT CG4 LACTIC ACID, ED   Results for orders placed or performed during the hospital encounter of 01/20/2016  Brain natriuretic peptide  Result Value Ref Range   B Natriuretic Peptide 60.0 0.0 - 100.0 pg/mL  CBC with Differential/Platelet  Result Value Ref Range   WBC 7.2 4.0 - 10.5 K/uL   RBC 4.95 4.22 - 5.81 MIL/uL   Hemoglobin 15.7 13.0 - 17.0 g/dL   HCT 48.7 39.0 - 52.0 %   MCV 98.4 78.0 - 100.0 fL   MCH 31.7 26.0 - 34.0 pg   MCHC 32.2 30.0 - 36.0 g/dL   RDW 14.4 11.5 - 15.5 %   Platelets 154 150 - 400 K/uL   Neutrophils Relative % 84 %   Neutro Abs 6.0 1.7 - 7.7 K/uL   Lymphocytes Relative 10 %   Lymphs Abs 0.7 0.7 - 4.0 K/uL   Monocytes Relative 6 %   Monocytes Absolute 0.4 0.1 - 1.0 K/uL   Eosinophils Relative 1 %   Eosinophils Absolute 0.1 0.0 - 0.7 K/uL   Basophils Relative 0 %   Basophils Absolute 0.0 0.0 - 0.1 K/uL  Comprehensive metabolic panel  Result Value Ref Range   Sodium 142 135 - 145 mmol/L   Potassium 4.3 3.5 - 5.1 mmol/L   Chloride 104 101 - 111 mmol/L   CO2 30 22 - 32 mmol/L   Glucose, Bld 104 (H) 65 - 99 mg/dL   BUN 9 6 - 20 mg/dL   Creatinine,  Ser 0.91 0.61 - 1.24 mg/dL   Calcium 8.5 (L) 8.9 - 10.3 mg/dL   Total Protein 6.9 6.5 - 8.1 g/dL   Albumin 3.9 3.5 - 5.0 g/dL   AST 24 15 - 41 U/L   ALT 10 (L) 17 - 63 U/L   Alkaline Phosphatase 87 38 - 126 U/L   Total Bilirubin 0.5 0.3 - 1.2 mg/dL   GFR calc non Af Amer >60 >60 mL/min   GFR calc Af Amer >60 >60 mL/min   Anion gap 8 5 - 15  Blood gas, arterial ([facility] & [facility] ONLY)  Result Value Ref Range   FIO2 40.00    Delivery systems VENTILATOR    Mode PRESSURE REGULATED VOLUME CONTROL    VT 430 mL   LHR 16 resp/min   Peep/cpap 5.0 cm H20   pH, Arterial 7.239 (L) 7.350 - 7.450   pCO2 arterial 64.2 (H) 32.0 - 48.0 mmHg   pO2, Arterial 253.00 (H) 83.0 - 108.0 mmHg   Bicarbonate 22.6 20.0 - 28.0 mmol/L   Acid-base deficit 0.1 0.0 - 2.0 mmol/L   O2 Saturation 99.2 %   Collection site RIGHT RADIAL    Drawn by [id number]    Sample type ARTERIAL    Allens test (pass/fail) PASS PASS  Urinalysis, Routine w reflex microscopic  Result Value Ref Range   Color, Urine YELLOW YELLOW   APPearance HAZY (A) CLEAR   Specific Gravity, Urine 1.015 1.005 - 1.030   pH 5.0 5.0 - 8.0   Glucose, UA NEGATIVE NEGATIVE mg/dL   Hgb urine dipstick SMALL (A) NEGATIVE   Bilirubin Urine NEGATIVE NEGATIVE   Ketones, ur 20 (A) NEGATIVE mg/dL  Protein, ur 100 (A) NEGATIVE mg/dL   Nitrite NEGATIVE NEGATIVE   Leukocytes, UA NEGATIVE NEGATIVE   RBC / HPF 0-5 0 - 5 RBC/hpf   WBC, UA 0-5 0 - 5 WBC/hpf   Bacteria, UA RARE (A) NONE SEEN   Hyaline Casts, UA PRESENT   Protime-INR  Result Value Ref Range   Prothrombin Time 13.4 11.4 - 15.2 seconds   INR 1.02   I-Stat CG4 Lactic Acid, ED  Result Value Ref Range   Lactic Acid, Venous 0.70 0.5 - 1.9 mmol/L  I-stat chem 8, ed  Result Value Ref Range   Sodium 143 135 - 145 mmol/L   Potassium 6.3 (HH) 3.5 - 5.1 mmol/L   Chloride 106 101 - 111 mmol/L   BUN 10 6 - 20 mg/dL   Creatinine, Ser 0.80 0.61 - 1.24 mg/dL   Glucose, Bld 120 (H) 65 - 99 mg/dL   Calcium, Ion  1.10 (L) 1.15 - 1.40 mmol/L   TCO2 33 0 - 100 mmol/L   Hemoglobin 16.7 13.0 - 17.0 g/dL   HCT 49.0 39.0 - 52.0 %   Comment NOTIFIED PHYSICIAN      EKG  EKG Interpretation None       Radiology Ct Head Wo Contrast  Result Date: 01/17/2016 CLINICAL DATA:  Unresponsive.  Possible unintentional overdose. EXAM: CT HEAD WITHOUT CONTRAST TECHNIQUE: Contiguous axial images were obtained from the base of the skull through the vertex without intravenous contrast. COMPARISON:  Head CT 04/19/2014 and MRI brain 04/19/2014 FINDINGS: Brain: Stable age related cerebral atrophy, ventriculomegaly and periventricular white matter disease. No extra-axial fluid collections are identified. No CT findings for acute hemispheric infarction or intracranial hemorrhage. No mass lesions. The brainstem and cerebellum are normal. Vascular: Stable vascular calcifications. No hyperdense vessels or obvious aneurysm. Skull: No skull fracture or bone lesions. Sinuses/Orbits: Extensive chronic paranasal sinus disease mainly involving the ethmoid air cells, left maxillary sinus and left half of the sphenoid sinus. The mastoid air cells and middle ear cavities are clear. The globes are intact. Other: No scalp lesions or scalp hematoma. IMPRESSION: 1. Age related cerebral atrophy, ventriculomegaly and periventricular white matter disease, unchanged when compared to prior examination. 2. No acute intracranial findings or mass lesions. 3. Chronic sinus disease. Electronically Signed   By: Marijo Sanes M.D.   On: 01/16/2016 17:00   Dg Chest Port 1 View  Result Date: 02/15/2016 CLINICAL DATA:  Respiratory failure EXAM: PORTABLE CHEST 1 VIEW COMPARISON:  10/27/2015 FINDINGS: Endotracheal tube placed. Tip is 4.5 cm from the carina. NG tube tip is beyond the gastroesophageal junction. Lungs are hyperaerated. New nodular density at the right apex measures less than 1 cm. No pneumothorax. Small right pleural effusion. IMPRESSION: Endotracheal  and NG tubes placed. Subcentimeter nodule at the right apex has developed. Small right pleural effusion. Electronically Signed   By: Marybelle Killings M.D.   On: 02/05/2016 16:09    Procedures Procedures (including critical care time)  CRITICAL CARE Performed by: Fredia Sorrow Total critical care time: 45 minutes Critical care time was exclusive of separately billable procedures and treating other patients. Critical care was necessary to treat or prevent imminent or life-threatening deterioration. Critical care was time spent personally by me on the following activities: development of treatment plan with patient and/or surrogate as well as nursing, discussions with consultants, evaluation of patient's response to treatment, examination of patient, obtaining history from patient or surrogate, ordering and performing treatments and interventions, ordering and review of laboratory studies, ordering  and review of radiographic studies, pulse oximetry and re-evaluation of patient's condition.  INTUBATION Performed by: Safiatou Islam  Required items: required blood products, implants, devices, and special equipment available Patient identity confirmed: provided demographic data and hospital-assigned identification number Time out: Immediately prior to procedure a "time out" was called to verify the correct patient, procedure, equipment, support staff and site/side marked as required.  Indications: Respiratory failure altered mental status   Intubation method: 4 Glidescope Laryngoscopy   Preoxygenation: 100% oxygen BVM  Sedatives: 20 mg Etomidate Paralytic: 60 mg Rocuronium  Tube Size: 7.5 cuffed  Post-procedure assessment: chest rise and ETCO2 monitor Breath sounds: equal and absent over the epigastrium Tube secured with: ETT holder Chest x-ray interpreted by radiologist and me.  Chest x-ray findings: endotracheal tube in appropriate position  Patient tolerated the procedure well with  no immediate complications.    Medications Ordered in ED Medications  0.9 %  sodium chloride infusion (75 mL/hr Intravenous New Bag/Given 01/21/2016 1534)  propofol (DIPRIVAN) 1000 MG/100ML infusion (not administered)  propofol (DIPRIVAN) 10 mg/mL bolus/IV push 20 mg (not administered)  propofol (DIPRIVAN) 1000 MG/100ML infusion (not administered)  etomidate (AMIDATE) injection (20 mg Intravenous Given 01/24/2016 1524)  rocuronium (ZEMURON) injection (60 mg Intravenous Given 02/15/2016 1526)  LORazepam (ATIVAN) injection 2 mg (2 mg Intravenous Given 01/24/2016 1537)  sodium chloride 0.9 % bolus 250 mL (0 mLs Intravenous Stopped 02/15/2016 1613)     Initial Impression / Assessment and Plan / ED Course  I have reviewed the triage vital signs and the nursing notes.  Pertinent labs & imaging results that were available during my care of the patient were reviewed by me and considered in my medical decision making (see chart for details).  Patient arrived unresponsive to painful stimuli. In respiratory distress. Receiving a albuterol nebulizer treatment on 9 L of oxygen was satting 100%. Moving air poorly. Blood sugar was fine. Patient required intubation.  Intubation was done without difficulties. Chest x-ray without any significant findings other than a small pleural effusion.  Family arrived family states is possible he may have had a double or triple dose of Xanax because when he has trouble breathing he tends to get anxious and he wants to take the Xanax.  Rest of patient's workup without any significant findings other than arterial blood gas showing of respiratory acidosis. And CO2 retention.  Head CT without any acute findings.  Patient after being intubated for about an hour and half started to get a little restless. Started on propofol drip.  Final Clinical Impressions(s) / ED Diagnoses   Final diagnoses:  Altered mental status, unspecified altered mental status type  COPD exacerbation  ([place])  Acute on chronic respiratory failure with hypercapnia [facility])    New Prescriptions New Prescriptions   No medications on file     Fredia Sorrow, MD 02/03/2016 1729

## 2016-02-08 DIAGNOSIS — J9621 Acute and chronic respiratory failure with hypoxia: Principal | ICD-10-CM

## 2016-02-08 LAB — BASIC METABOLIC PANEL
ANION GAP: 9 (ref 5–15)
BUN: 15 mg/dL (ref 6–20)
CO2: 24 mmol/L (ref 22–32)
Calcium: 7.5 mg/dL — ABNORMAL LOW (ref 8.9–10.3)
Chloride: 111 mmol/L (ref 101–111)
Creatinine, Ser: 1.06 mg/dL (ref 0.61–1.24)
GFR calc Af Amer: >60 mL/min (ref >60)
GFR calc non Af Amer: >60 mL/min (ref >60)
Glucose, Bld: 161 mg/dL — ABNORMAL HIGH (ref 65–99)
POTASSIUM: 4.6 mmol/L (ref 3.5–5.1)
Sodium: 144 mmol/L (ref 135–145)

## 2016-02-08 LAB — BLOOD GAS, ARTERIAL
Acid-base deficit: 1.8 mmol/L (ref 0.0–2.0)
BICARBONATE: 21.1 mmol/L (ref 20.0–28.0)
Drawn by: 270161
FIO2: 35
Mode: POSITIVE
O2 Saturation: 95.3 %
PATIENT TEMPERATURE: 37
PEEP: 5 cmH2O
PO2 ART: 90.6 mmHg (ref 83.0–108.0)
PRESSURE SUPPORT: 10 cmH2O
pCO2 arterial: 71.1 mmHg (ref 32.0–48.0)
pH, Arterial: 7.182 — CL (ref 7.350–7.450)

## 2016-02-08 LAB — CBC
HEMATOCRIT: 39.2 % (ref 39.0–52.0)
HEMOGLOBIN: 12.8 g/dL — AB (ref 13.0–17.0)
MCH: 32.2 pg (ref 26.0–34.0)
MCHC: 32.7 g/dL (ref 30.0–36.0)
MCV: 98.5 fL (ref 78.0–100.0)
Platelets: 152 10*3/uL (ref 150–400)
RBC: 3.98 MIL/uL — ABNORMAL LOW (ref 4.22–5.81)
RDW: 14.7 % (ref 11.5–15.5)
WBC: 4.9 10*3/uL (ref 4.0–10.5)

## 2016-02-08 LAB — INFLUENZA PANEL BY PCR (TYPE A & B)
INFLAPCR: NEGATIVE
Influenza B By PCR: NEGATIVE

## 2016-02-08 LAB — GLUCOSE, CAPILLARY: Glucose-Capillary: 127 mg/dL — ABNORMAL HIGH (ref 65–99)

## 2016-02-08 LAB — LACTIC ACID, PLASMA: Lactic Acid, Venous: 1.6 mmol/L (ref 0.5–1.9)

## 2016-02-08 MED ORDER — FENTANYL CITRATE (PF) 2500 MCG/50ML IJ SOLN
INTRAMUSCULAR | Status: AC
  Filled 2016-02-08: qty 50

## 2016-02-08 MED ORDER — OSELTAMIVIR PHOSPHATE 75 MG PO CAPS: 75.0000 mg | ORAL_CAPSULE | Freq: Two times a day (BID) | ORAL | Status: DC

## 2016-02-08 MED ORDER — SODIUM CHLORIDE 0.9 % IV BOLUS (SEPSIS)
500.0000 mL | Freq: Once | INTRAVENOUS | Status: AC
  Administered 2016-02-08: 500 mL via INTRAVENOUS

## 2016-02-08 MED ORDER — OSELTAMIVIR PHOSPHATE 30 MG PO CAPS
30.0000 mg | ORAL_CAPSULE | Freq: Two times a day (BID) | ORAL | Status: DC
  Administered 2016-02-08 – 2016-02-09 (×2): 30 mg via ORAL
  Filled 2016-02-08 (×2): qty 1

## 2016-02-08 MED ORDER — METHYLPREDNISOLONE SODIUM SUCC 125 MG IJ SOLR
60.0000 mg | Freq: Three times a day (TID) | INTRAMUSCULAR | Status: DC
  Administered 2016-02-08 – 2016-02-09 (×3): 60 mg via INTRAVENOUS
  Filled 2016-02-08 (×3): qty 2

## 2016-02-08 MED ORDER — DEXTROSE 5 % IV SOLN
INTRAVENOUS | Status: AC
  Filled 2016-02-08: qty 10

## 2016-02-08 NOTE — Progress Notes (Signed)
PROGRESS NOTE    [name]  [name] DOB: November 25, 1940 DOA: 01/23/2016 PCP: Jeremy Blackbird, MD     Brief Narrative:  76 year old man admitted to the hospital on 1/23 due to shortness of breath, was intubated in the emergency department.   Assessment & Plan:   Principal Problem:   COPD exacerbation ([place]) Active Problems:   COPD (chronic obstructive pulmonary disease) ([place])   Essential hypertension, benign   Respiratory failure (HCC)   Acute on chronic respiratory failure (HCC)   Respiratory disorder with ventilator dependence (HCC)   Fever   Acute bronchitis   Acute on chronic hypoxemic respiratory failure, ventilatory dependent -Presumed due to COPD exacerbation, was febrile on admission so unable to completely exclude community-acquired pneumonia and is being treated empirically with antibiotics, despite inconclusive chest x-ray. -I see that he has not been swabbed for influenza, will order and place on Tamiflu empirically. -Tried weaning today, however ABG during weaning showed significant worsening of respiratory acidosis and hence weaning trial was discontinued. -Continue steroids for COPD exacerbation, nebs. -Appreciate Dr. Luan [name] input and recommendations.   DVT prophylaxis: Lovenox Code Status: Full code Family Communication: No family at bedside Disposition Plan: Remains intubated today  Consultants:   Pulmonary, Dr. Luan [name]  Procedures:   None  Antimicrobials:  Anti-infectives    Start     Dose/Rate Route Frequency Ordered Stop   01/21/2016 2300  cefTRIAXone (ROCEPHIN) 1 g in dextrose 5 % 50 mL IVPB     1 g 100 mL/hr over 30 Minutes Intravenous Every 24 hours 01/16/2016 2100     02/10/2016 2200  azithromycin (ZITHROMAX) 500 mg in dextrose 5 % 250 mL IVPB     500 mg 250 mL/hr over 60 Minutes Intravenous Every 24 hours 01/16/2016 2100         Subjective: Can open eyes briefly to voice, cannot shake head in response to  questions.  Objective: Vitals:   02/08/16 1200 02/08/16 1300 02/08/16 1350 02/08/16 1600  BP: 108/74 95/77    Pulse: 97 98    Resp: 16 17    Temp: 99.1 F (37.3 C) 99.2 F (37.3 C)    TempSrc:      SpO2:   98% 98%  Weight:      Height:        Intake/Output Summary (Last 24 hours) at 02/08/16 1650 Last data filed at 02/08/16 1100  Gross per 24 hour  Intake             1670 ml  Output                0 ml  Net             1670 ml   Filed Weights   01/18/2016 1509 01/22/2016 2007 02/08/16 0500  Weight: 59.9 kg (132 lb) 57.6 kg (126 lb 15.8 oz) 57.6 kg (126 lb 15.8 oz)    Examination:  General exam: Intubated, sedated Respiratory system: Clear to auscultation. Respiratory effort normal. Cardiovascular system:RRR. No murmurs, rubs, gallops. Gastrointestinal system: Abdomen is nondistended, soft and nontender. No organomegaly or masses felt. Normal bowel sounds heard. Central nervous system: Unable to fully assess as he is currently intubated, however moves all 4 spontaneously Extremities: No C/C/E, +pedal pulses Skin: No rashes, lesions or ulcers Psychiatry: Unable to assess as he is currently intubated    Data Reviewed: I have personally reviewed following labs and imaging studies  CBC:  Recent Labs Lab 01/28/2016 1523 01/19/2016 1559 02/08/16 1308  WBC  --  7.2 4.9  NEUTROABS  --  6.0  --   HGB 16.7 15.7 12.8*  HCT 49.0 48.7 39.2  MCV  --  98.4 98.5  PLT  --  154 810   Basic Metabolic Panel:  Recent Labs Lab 01/20/2016 1523 01/26/2016 1559 02/08/16 0323  NA 143 142 144  K 6.3* 4.3 4.6  CL 106 104 111  CO2  --  30 24  GLUCOSE 120* 104* 161*  BUN 10 9 15   CREATININE 0.80 0.91 1.06  CALCIUM  --  8.5* 7.5*   GFR: Estimated Creatinine Clearance: 49.1 mL/min (by C-G formula based on SCr of 1.06 mg/dL). Liver Function Tests:  Recent Labs Lab 02/02/2016 1559  AST 24  ALT 10*  ALKPHOS 87  BILITOT 0.5  PROT 6.9  ALBUMIN 3.9   No results for input(s):  LIPASE, AMYLASE in the last 168 hours. No results for input(s): AMMONIA in the last 168 hours. Coagulation Profile:  Recent Labs Lab 02/02/2016 1630  INR 1.02   Cardiac Enzymes: No results for input(s): CKTOTAL, CKMB, CKMBINDEX, TROPONINI in the last 168 hours. BNP (last 3 results) No results for input(s): PROBNP in the last 8760 hours. HbA1C: No results for input(s): HGBA1C in the last 72 hours. CBG:  Recent Labs Lab 01/29/2016 1506  GLUCAP 127*   Lipid Profile: No results for input(s): CHOL, HDL, LDLCALC, TRIG, CHOLHDL, LDLDIRECT in the last 72 hours. Thyroid Function Tests: No results for input(s): TSH, T4TOTAL, FREET4, T3FREE, THYROIDAB in the last 72 hours. Anemia Panel: No results for input(s): VITAMINB12, FOLATE, FERRITIN, TIBC, IRON, RETICCTPCT in the last 72 hours. Urine analysis:    Component Value Date/Time   COLORURINE YELLOW 01/30/2016 1544   APPEARANCEUR HAZY (A) 02/09/2016 1544   LABSPEC 1.015 01/26/2016 1544   PHURINE 5.0 02/11/2016 1544   GLUCOSEU NEGATIVE 02/11/2016 1544   HGBUR SMALL (A) 02/03/2016 1544   BILIRUBINUR NEGATIVE 02/05/2016 1544   KETONESUR 20 (A) 02/06/2016 1544   PROTEINUR 100 (A) 02/05/2016 1544   UROBILINOGEN 0.2 04/19/2014 1946   NITRITE NEGATIVE 02/03/2016 1544   LEUKOCYTESUR NEGATIVE 01/23/2016 1544   Sepsis Labs: @LABRCNTIP (procalcitonin:4,lacticidven:4)  ) Recent Results (from the past 240 hour(s))  MRSA PCR Screening     Status: Abnormal   Collection Time: 01/26/2016  8:30 PM  Result Value Ref Range Status   MRSA by PCR POSITIVE (A) NEGATIVE Final    Comment:        The GeneXpert MRSA Assay (FDA approved for NASAL specimens only), is one component of a comprehensive MRSA colonization surveillance program. It is not intended to diagnose MRSA infection nor to guide or monitor treatment for MRSA infections. RESULT CALLED TO, READ BACK BY AND VERIFIED WITH: ROBERTS,T ON 02/12/2016 AT 2255 BY LOY,C          Radiology  Studies: Ct Head Wo Contrast  Result Date: 01/25/2016 CLINICAL DATA:  Unresponsive.  Possible unintentional overdose. EXAM: CT HEAD WITHOUT CONTRAST TECHNIQUE: Contiguous axial images were obtained from the base of the skull through the vertex without intravenous contrast. COMPARISON:  Head CT 04/19/2014 and MRI brain 04/19/2014 FINDINGS: Brain: Stable age related cerebral atrophy, ventriculomegaly and periventricular white matter disease. No extra-axial fluid collections are identified. No CT findings for acute hemispheric infarction or intracranial hemorrhage. No mass lesions. The brainstem and cerebellum are normal. Vascular: Stable vascular calcifications. No hyperdense vessels or obvious aneurysm. Skull: No skull fracture or bone lesions. Sinuses/Orbits: Extensive chronic paranasal sinus disease mainly involving  the ethmoid air cells, left maxillary sinus and left half of the sphenoid sinus. The mastoid air cells and middle ear cavities are clear. The globes are intact. Other: No scalp lesions or scalp hematoma. IMPRESSION: 1. Age related cerebral atrophy, ventriculomegaly and periventricular white matter disease, unchanged when compared to prior examination. 2. No acute intracranial findings or mass lesions. 3. Chronic sinus disease. Electronically Signed   By: Marijo Sanes M.D.   On: 02/05/2016 17:00   Dg Chest Port 1 View  Result Date: 02/09/2016 CLINICAL DATA:  Respiratory failure EXAM: PORTABLE CHEST 1 VIEW COMPARISON:  10/27/2015 FINDINGS: Endotracheal tube placed. Tip is 4.5 cm from the carina. NG tube tip is beyond the gastroesophageal junction. Lungs are hyperaerated. New nodular density at the right apex measures less than 1 cm. No pneumothorax. Small right pleural effusion. IMPRESSION: Endotracheal and NG tubes placed. Subcentimeter nodule at the right apex has developed. Small right pleural effusion. Electronically Signed   By: Marybelle Killings M.D.   On: 01/29/2016 16:09        Scheduled  Meds: . azithromycin  500 mg Intravenous Q24H  . cefTRIAXone (ROCEPHIN)  IV  1 g Intravenous Q24H  . chlorhexidine gluconate (MEDLINE KIT)  15 mL Mouth Rinse BID  . enoxaparin (LOVENOX) injection  40 mg Subcutaneous Q24H  . famotidine (PEPCID) IV  20 mg Intravenous Q12H  . fentaNYL (SUBLIMAZE) injection  50 mcg Intravenous Once  . ipratropium-albuterol  3 mL Nebulization Q6H  . mouth rinse  15 mL Mouth Rinse QID  . methylPREDNISolone (SOLU-MEDROL) injection  60 mg Intravenous Q8H  . propofol  20 mg Intravenous Once  . propofol (DIPRIVAN) infusion  5-80 mcg/kg/min Intravenous Once   Continuous Infusions: . sodium chloride 75 mL/hr at 02/01/2016 2149  . fentaNYL infusion INTRAVENOUS 100 mcg/hr (02/08/16 1200)     LOS: 1 day    Critical care time spent: 45 minutes. Greater than 50% of this time was spent in direct contact with the patient coordinating care.     Lelon Frohlich, MD [facility] Pager [phone]  If 7PM-7AM, please contact night-coverage [email or web address] Password [facility] 02/08/2016, 4:50 PM

## 2016-02-08 NOTE — Consult Note (Signed)
Consult requested by: Dr. Jerilee Hoh Consult requested for respiratory failure:  HPI: This is a 76 year old who has a known history of COPD. He has been intubated in the past. He has chronic hypoxic respiratory failure on oxygen at 2 L. He's been having increasing problems with shortness of breath for several days. History is from the medical record as there is no family available and he is intubated but it is stated that more than one family member had been giving him Xanax to try to help with his breathing and his anxiety. He was poorly responsive in the emergency department and required intubation. He's had some trouble with becoming agitated he's been on propofol and his blood pressure has been somewhat marginal.  Past Medical History:  Diagnosis Date  . Anxiety   . Arthritis   . Cancer ([place])    skin  . Chest pain   . COPD (chronic obstructive pulmonary disease) ([place])    [facility]  . GERD (gastroesophageal reflux disease)   . Hyperlipidemia   . Hypertension   . Lung disease   . PVD (peripheral vascular disease) (H[facility])    RLE ABI 0.8     No family history on file.   Social History   Social History  . Marital status: Married    Spouse name: N/A  . Number of children: N/A  . Years of education: N/A   Social History Main Topics  . Smoking status: Current Some Day Smoker    Packs/day: 1.00    Types: Cigarettes, Cigars  . Smokeless tobacco: Current User    Types: Snuff  . Alcohol use No  . Drug use: No  . Sexual activity: Not Asked   Other Topics Concern  . None   Social History Narrative  . None     ROS: unobtainable    Objective: Vital signs in last 24 hours: Temp:  [97.3 F (36.3 C)-100.9 F (38.3 C)] 99.2 F (37.3 C) (01/24 0723) Pulse Rate:  [81-119] 86 (01/24 0723) Resp:  [14-30] 18 (01/24 0723) BP: (81-178)/(59-108) 82/62 (01/24 0300) SpO2:  [96 %-100 %] 99 % (01/24 0758) FiO2 (%):  [35 %-40 %] 35 % (01/24 0758) Weight:  [57.6  kg (126 lb 15.8 oz)-59.9 kg (132 lb)] 57.6 kg (126 lb 15.8 oz) (01/24 0500) Weight change:  Last BM Date:  (PTA)  Intake/Output from previous day: 01/23 0701 - 01/24 0700 In: 250 [IV Piggyback:250] Out: 50 [Urine:50]  PHYSICAL EXAM Constitutional: Intubated on mechanical ventilation sedated but he does open his eyes and look around. Eyes: Pupils react EOMI. Ears nose mouth and throat: His mucous membranes are slightly dry. Throat is clear. Cardiovascular: His heart is regular with distant heart sounds. No edema. Respiratory: Intubated on mechanical ventilation. Lungs show significant wheezing. Gastrointestinal: His abdomen is soft with no masses bowel sounds are present. Musculoskeletal: Unable to assess neurological: He is moving all 4 extremities. Psychiatric: Unable to assess  Lab Results: Basic Metabolic Panel:  Recent Labs  02/06/2016 1559 02/08/16 0323  NA 142 144  K 4.3 4.6  CL 104 111  CO2 30 24  GLUCOSE 104* 161*  BUN 9 15  CREATININE 0.91 1.06  CALCIUM 8.5* 7.5*   Liver Function Tests:  Recent Labs  02/08/2016 1559  AST 24  ALT 10*  ALKPHOS 87  BILITOT 0.5  PROT 6.9  ALBUMIN 3.9   No results for input(s): LIPASE, AMYLASE in the last 72 hours. No results for input(s): AMMONIA in the  last 72 hours. CBC:  Recent Labs  01/18/2016 1559 02/08/16 0323  WBC 7.2 4.9  NEUTROABS 6.0  --   HGB 15.7 12.8*  HCT 48.7 39.2  MCV 98.4 98.5  PLT 154 152   Cardiac Enzymes: No results for input(s): CKTOTAL, CKMB, CKMBINDEX, TROPONINI in the last 72 hours. BNP: No results for input(s): PROBNP in the last 72 hours. D-Dimer: No results for input(s): DDIMER in the last 72 hours. CBG: No results for input(s): GLUCAP in the last 72 hours. Hemoglobin A1C: No results for input(s): HGBA1C in the last 72 hours. Fasting Lipid Panel: No results for input(s): CHOL, HDL, LDLCALC, TRIG, CHOLHDL, LDLDIRECT in the last 72 hours. Thyroid Function Tests: No results for input(s): TSH,  T4TOTAL, FREET4, T3FREE, THYROIDAB in the last 72 hours. Anemia Panel: No results for input(s): VITAMINB12, FOLATE, FERRITIN, TIBC, IRON, RETICCTPCT in the last 72 hours. Coagulation:  Recent Labs  01/31/2016 1630  LABPROT 13.4  INR 1.02   Urine Drug Screen: Drugs of Abuse  No results found for: LABOPIA, COCAINSCRNUR, LABBENZ, AMPHETMU, THCU, LABBARB  Alcohol Level: No results for input(s): ETH in the last 72 hours. Urinalysis:  Recent Labs  02/03/2016 1544  COLORURINE YELLOW  LABSPEC 1.015  PHURINE 5.0  GLUCOSEU NEGATIVE  HGBUR SMALL*  BILIRUBINUR NEGATIVE  KETONESUR 20*  PROTEINUR 100*  NITRITE NEGATIVE  LEUKOCYTESUR NEGATIVE   Misc. Labs:   ABGS:  Recent Labs  02/06/2016 1523  01/16/2016 2100  PHART  --   < > 7.284*  PO2ART  --   < > 95.3  TCO2 33  --   --   HCO3  --   < > 22.8  < > = values in this interval not displayed.   MICROBIOLOGY: Recent Results (from the past 240 hour(s))  MRSA PCR Screening     Status: Abnormal   Collection Time: 01/27/2016  8:30 PM  Result Value Ref Range Status   MRSA by PCR POSITIVE (A) NEGATIVE Final    Comment:        The GeneXpert MRSA Assay (FDA approved for NASAL specimens only), is one component of a comprehensive MRSA colonization surveillance program. It is not intended to diagnose MRSA infection nor to guide or monitor treatment for MRSA infections. RESULT CALLED TO, READ BACK BY AND VERIFIED WITH: ROBERTS,T ON 01/20/2016 AT 2255 BY LOY,C     Studies/Results: Ct Head Wo Contrast  Result Date: 01/23/2016 CLINICAL DATA:  Unresponsive.  Possible unintentional overdose. EXAM: CT HEAD WITHOUT CONTRAST TECHNIQUE: Contiguous axial images were obtained from the base of the skull through the vertex without intravenous contrast. COMPARISON:  Head CT 04/19/2014 and MRI brain 04/19/2014 FINDINGS: Brain: Stable age related cerebral atrophy, ventriculomegaly and periventricular white matter disease. No extra-axial fluid  collections are identified. No CT findings for acute hemispheric infarction or intracranial hemorrhage. No mass lesions. The brainstem and cerebellum are normal. Vascular: Stable vascular calcifications. No hyperdense vessels or obvious aneurysm. Skull: No skull fracture or bone lesions. Sinuses/Orbits: Extensive chronic paranasal sinus disease mainly involving the ethmoid air cells, left maxillary sinus and left half of the sphenoid sinus. The mastoid air cells and middle ear cavities are clear. The globes are intact. Other: No scalp lesions or scalp hematoma. IMPRESSION: 1. Age related cerebral atrophy, ventriculomegaly and periventricular white matter disease, unchanged when compared to prior examination. 2. No acute intracranial findings or mass lesions. 3. Chronic sinus disease. Electronically Signed   By: Marijo Sanes M.D.   On: 01/24/2016 17:00  Dg Chest Port 1 View  Result Date: 02/15/2016 CLINICAL DATA:  Respiratory failure EXAM: PORTABLE CHEST 1 VIEW COMPARISON:  10/27/2015 FINDINGS: Endotracheal tube placed. Tip is 4.5 cm from the carina. NG tube tip is beyond the gastroesophageal junction. Lungs are hyperaerated. New nodular density at the right apex measures less than 1 cm. No pneumothorax. Small right pleural effusion. IMPRESSION: Endotracheal and NG tubes placed. Subcentimeter nodule at the right apex has developed. Small right pleural effusion. Electronically Signed   By: Marybelle Killings M.D.   On: 02/09/2016 16:09    Medications:  Prior to Admission:  Prescriptions Prior to Admission  Medication Sig Dispense Refill Last Dose  . albuterol (PROVENTIL HFA;VENTOLIN HFA) 108 (90 Base) MCG/ACT inhaler Inhale 1-2 puffs into the lungs every 6 (six) hours as needed for wheezing or shortness of breath. 1 Inhaler 0 Past Month at Unknown time  . ALPRAZolam (XANAX) 1 MG tablet Take 1 tablet (1 mg total) by mouth 3 (three) times daily as needed for anxiety. 90 tablet 1 Past Month at Unknown time  .  budesonide-formoterol (SYMBICORT) 160-4.5 MCG/ACT inhaler Inhale 2 puffs into the lungs 2 (two) times daily.   Past Month at Unknown time  . FLUoxetine (PROZAC) 40 MG capsule Take 1 capsule (40 mg total) by mouth daily. 30 capsule 3 Past Month at Unknown time  . Ivermectin 0.5 % LOTN Apply thoroughly to dry hair and scalp; leave lotion on hair for 10 minutes, and then rinse with water. 1 Tube 1 Past Month at Unknown time  . loratadine (CLARITIN) 10 MG tablet Take 1 tablet (10 mg total) by mouth daily. 30 tablet 11 Past Month at Unknown time  . omeprazole (PRILOSEC) 20 MG capsule take 1 capsule by mouth once daily 30 capsule 3 Past Month at Unknown time  . propranolol (INDERAL) 10 MG tablet take 1 tablet by mouth twice a day for TREMOR 60 tablet 3 Past Month at Unknown time  . rosuvastatin (CRESTOR) 10 MG tablet take 1 tablet by mouth once daily 30 tablet 3 Past Month at Unknown time  . tiotropium (SPIRIVA) 18 MCG inhalation capsule Place 18 mcg into inhaler and inhale daily.   Past Month at Unknown time  . albuterol (PROVENTIL) (2.5 MG/3ML) 0.083% nebulizer solution Take 3 mLs (2.5 mg total) by nebulization every 6 (six) hours as needed for wheezing or shortness of breath. 150 mL 1   . amLODipine (NORVASC) 10 MG tablet Take 10 mg by mouth daily.   10/27/2015 at Unknown time  . ibuprofen (ADVIL,MOTRIN) 200 MG tablet Take 200 mg by mouth every 6 (six) hours as needed for headache, mild pain or moderate pain. For pain    10/26/2015 at 1700  . nitroGLYCERIN (NITROSTAT) 0.4 MG SL tablet Place 0.4 mg under the tongue every 5 (five) minutes as needed for chest pain.   Past Month at Unknown time  . vitamin B-12 (CYANOCOBALAMIN) 1000 MCG tablet Take 1,000 mcg by mouth daily.   10/27/2015 at Unknown time  . [DISCONTINUED] predniSONE (DELTASONE) 20 MG tablet 2 tabs po daily x 4 days 8 tablet 0    Scheduled: . sodium chloride   Intravenous STAT  . azithromycin  500 mg Intravenous Q24H  . cefTRIAXone (ROCEPHIN)   IV  1 g Intravenous Q24H  . chlorhexidine gluconate (MEDLINE KIT)  15 mL Mouth Rinse BID  . enoxaparin (LOVENOX) injection  40 mg Subcutaneous Q24H  . famotidine (PEPCID) IV  20 mg Intravenous Q12H  . fentaNYL (SUBLIMAZE)  injection  50 mcg Intravenous Once  . ipratropium-albuterol  3 mL Nebulization Q6H  . mouth rinse  15 mL Mouth Rinse QID  . methylPREDNISolone (SOLU-MEDROL) injection  60 mg Intravenous Q12H  . propofol  20 mg Intravenous Once  . propofol (DIPRIVAN) infusion  5-80 mcg/kg/min Intravenous Once   Continuous: . sodium chloride 75 mL/hr at 01/25/2016 2149  . fentaNYL infusion INTRAVENOUS 100 mcg/hr (02/08/16 0236)   URK:[id number], fentaNYL, sodium chloride  Assesment:he was admitted with COPD exacerbation. At baseline he has hypoxic respiratory failure but now has hypoxic and hypercapnic respiratory failure requiring ventilator support. Weaning was attempted earlier this morning but his volumes were very low and we'll try again.  Principal Problem:   COPD exacerbation ([place]) Active Problems:   COPD (chronic obstructive pulmonary disease) (H[facility])   Essential hypertension, benign   Respiratory failure ([facility])   Acute on chronic respiratory failure ([facility])   Respiratory disorder with ventilator dependence ([facility])   Fever   Acute bronchitis    Plan:continue treatments.    LOS: 1 day   , L 02/08/2016, 8:33 AM

## 2016-02-08 NOTE — Progress Notes (Signed)
Pt was weaning on CP/PSV beautifully. ABG drawn and showed patient is still acidotic. Due to ABG results patient was placed back on ventilator at this time. Reassessment to wean will be done in the morning. Pt is now resting in PRVC mode and RT will continue to monitor closely.

## 2016-02-08 NOTE — Progress Notes (Signed)
2nd attempt at CP/PSV. Pt is tolerating this mode well at this time. Great volumes and seems very comfortable. SPO2 maintaining. I will obtain an ABG in ab 30 mins or so to ensure were are taking the appropriate steps. RT will cont to monitor pt  Began wean at [phone]

## 2016-02-08 NOTE — Progress Notes (Addendum)
CCMD notified regarding BP <90 and mechanical ventilation orders. MD ordered Lactate to be drawn with morning labs and to monitor Bp, Keep map at or > 65, bolus with 500 mL if needed. Vent orders put in. RN to continue to monitor pt.   Ericka Pontiff, RN 3:21 AM  02/08/16

## 2016-02-08 NOTE — Progress Notes (Signed)
attetmpted wean of PSV 5/5. Pts vt were very low at this time. Will reevaluate after decrease in fentanyl.

## 2016-02-08 NOTE — Progress Notes (Signed)
[facility] Progress Note Patient Name: [name] DOB: [birth year] MRN: [id number]   Date of Service  02/08/2016  HPI/Events of Note  Soft BP,  eICU Interventions  Rpt lactate Accept MAP 65     Intervention Category Intermediate Interventions: Hypotension - evaluation and management  Rolin Schult V. 02/08/2016, 3:17 AM

## 2016-02-09 ENCOUNTER — Inpatient Hospital Stay (HOSPITAL_COMMUNITY): Payer: Medicare HMO

## 2016-02-09 ENCOUNTER — Encounter (HOSPITAL_COMMUNITY): Payer: Self-pay | Admitting: Primary Care

## 2016-02-09 DIAGNOSIS — Z7189 Other specified counseling: Secondary | ICD-10-CM

## 2016-02-09 DIAGNOSIS — Z515 Encounter for palliative care: Secondary | ICD-10-CM

## 2016-02-09 LAB — BLOOD GAS, ARTERIAL
ACID-BASE DEFICIT: 5.5 mmol/L — AB (ref 0.0–2.0)
Acid-base deficit: 3 mmol/L — ABNORMAL HIGH (ref 0.0–2.0)
Acid-base deficit: 2.6 mmol/L — ABNORMAL HIGH (ref 0.0–2.0)
Acid-base deficit: 5.5 mmol/L — ABNORMAL HIGH (ref 0.0–2.0)
BICARBONATE: 19.1 mmol/L — AB (ref 20.0–28.0)
Bicarbonate: 20.9 mmol/L (ref 20.0–28.0)
Bicarbonate: 20.7 mmol/L (ref 20.0–28.0)
Bicarbonate: 18.8 mmol/L — ABNORMAL LOW (ref 20.0–28.0)
Drawn by: 21310
Drawn by: 21310
Drawn by: 277331
Drawn by: 221791
FIO2: 35
FIO2: 30
FIO2: 0.7
FIO2: 50
LHR: 20 {breaths}/min
MECHVT: 450 mL
MECHVT: 500 mL
MECHVT: 500 mL
O2 Saturation: 97.2 %
O2 Saturation: 95.4 %
O2 Saturation: 99.2 %
O2 Saturation: 97.9 %
PEEP: 5 cmH2O
PEEP: 5 cmH2O
PEEP: 5 cmH2O
PEEP: 5 cmH2O
PO2 ART: 122 mmHg — AB (ref 83.0–108.0)
Patient temperature: 37.8
Patient temperature: 37.8
Patient temperature: 99.2
Patient temperature: 37
RATE: 18 {breaths}/min
RATE: 18 {breaths}/min
RATE: 16 {breaths}/min
VT: 500 mL
pCO2 arterial: 60.4 mmHg — ABNORMAL HIGH (ref 32.0–48.0)
pCO2 arterial: 60.4 mmHg — ABNORMAL HIGH (ref 32.0–48.0)
pCO2 arterial: 57.2 mmHg — ABNORMAL HIGH (ref 32.0–48.0)
pCO2 arterial: 52.2 mmHg — ABNORMAL HIGH (ref 32.0–48.0)
pH, Arterial: 7.22 — ABNORMAL LOW (ref 7.350–7.450)
pH, Arterial: 7.226 — ABNORMAL LOW (ref 7.350–7.450)
pH, Arterial: 7.197 — CL (ref 7.350–7.450)
pH, Arterial: 7.227 — ABNORMAL LOW (ref 7.350–7.450)
pO2, Arterial: 112 mmHg — ABNORMAL HIGH (ref 83.0–108.0)
pO2, Arterial: 88.8 mmHg (ref 83.0–108.0)
pO2, Arterial: 269 mmHg — ABNORMAL HIGH (ref 83.0–108.0)

## 2016-02-09 LAB — GLUCOSE, CAPILLARY
GLUCOSE-CAPILLARY: 110 mg/dL — AB (ref 65–99)
GLUCOSE-CAPILLARY: 143 mg/dL — AB (ref 65–99)
Glucose-Capillary: 118 mg/dL — ABNORMAL HIGH (ref 65–99)
Glucose-Capillary: 129 mg/dL — ABNORMAL HIGH (ref 65–99)
Glucose-Capillary: 136 mg/dL — ABNORMAL HIGH (ref 65–99)

## 2016-02-09 LAB — TROPONIN I
TROPONIN I: 2.18 ng/mL — AB (ref <0.03)
Troponin I: 0.74 ng/mL (ref <0.03)
Troponin I: 0.86 ng/mL (ref <0.03)

## 2016-02-09 LAB — PHOSPHORUS
Phosphorus: 3.8 mg/dL (ref 2.5–4.6)
Phosphorus: 3.7 mg/dL (ref 2.5–4.6)

## 2016-02-09 LAB — MAGNESIUM
Magnesium: 2.1 mg/dL (ref 1.7–2.4)
Magnesium: 2 mg/dL (ref 1.7–2.4)

## 2016-02-09 LAB — TRIGLYCERIDES: TRIGLYCERIDES: 106 mg/dL (ref <150)

## 2016-02-09 MED ORDER — VITAL HIGH PROTEIN PO LIQD
1000.0000 mL | ORAL | Status: DC
  Administered 2016-02-09: 1000 mL
  Filled 2016-02-09 (×2): qty 1000

## 2016-02-09 MED ORDER — PRO-STAT SUGAR FREE PO LIQD
30.0000 mL | Freq: Two times a day (BID) | ORAL | Status: DC
  Administered 2016-02-09: 30 mL
  Filled 2016-02-09: qty 30

## 2016-02-09 MED ORDER — VANCOMYCIN HCL 500 MG IV SOLR
500.0000 mg | Freq: Two times a day (BID) | INTRAVENOUS | Status: DC
  Administered 2016-02-09 – 2016-02-10 (×2): 500 mg via INTRAVENOUS
  Filled 2016-02-09 (×8): qty 500

## 2016-02-09 MED ORDER — BISACODYL 10 MG RE SUPP
10.0000 mg | Freq: Every day | RECTAL | Status: DC | PRN
Start: 2016-02-09 — End: 2016-02-21
  Administered 2016-02-10 – 2016-02-16 (×3): 10 mg via RECTAL
  Filled 2016-02-09 (×4): qty 1

## 2016-02-09 MED ORDER — VANCOMYCIN HCL 10 G IV SOLR
1250.0000 mg | Freq: Once | INTRAVENOUS | Status: AC
  Administered 2016-02-09: 1250 mg via INTRAVENOUS
  Filled 2016-02-09: qty 1250

## 2016-02-09 MED ORDER — MUPIROCIN 2 % EX OINT
1.0000 "application " | TOPICAL_OINTMENT | Freq: Two times a day (BID) | CUTANEOUS | Status: AC
  Administered 2016-02-09 – 2016-02-13 (×10): 1 via NASAL
  Filled 2016-02-09 (×2): qty 22

## 2016-02-09 MED ORDER — PIPERACILLIN-TAZOBACTAM 3.375 G IVPB
3.3750 g | Freq: Three times a day (TID) | INTRAVENOUS | Status: DC
  Administered 2016-02-09 – 2016-02-17 (×25): 3.375 g via INTRAVENOUS
  Filled 2016-02-09 (×23): qty 50

## 2016-02-09 MED ORDER — VITAL AF 1.2 CAL PO LIQD
1000.0000 mL | ORAL | Status: DC
  Administered 2016-02-09 – 2016-02-10 (×2): 1000 mL
  Filled 2016-02-09 (×7): qty 1000

## 2016-02-09 MED ORDER — METHYLPREDNISOLONE SODIUM SUCC 125 MG IJ SOLR
125.0000 mg | Freq: Four times a day (QID) | INTRAMUSCULAR | Status: DC
  Administered 2016-02-09 – 2016-02-12 (×11): 125 mg via INTRAVENOUS
  Filled 2016-02-09 (×9): qty 2

## 2016-02-09 MED ORDER — CHLORHEXIDINE GLUCONATE CLOTH 2 % EX PADS
6.0000 | MEDICATED_PAD | Freq: Every day | CUTANEOUS | Status: AC
  Administered 2016-02-09 – 2016-02-13 (×4): 6 via TOPICAL

## 2016-02-09 MED ORDER — ACETAMINOPHEN 650 MG RE SUPP
650.0000 mg | RECTAL | Status: DC | PRN
  Administered 2016-02-09 – 2016-02-20 (×10): 650 mg via RECTAL
  Filled 2016-02-09 (×10): qty 1

## 2016-02-09 MED ORDER — PROPOFOL 1000 MG/100ML IV EMUL
5.0000 ug/kg/min | INTRAVENOUS | Status: DC
  Administered 2016-02-09: 5 ug/kg/min via INTRAVENOUS
  Administered 2016-02-10: 15 ug/kg/min via INTRAVENOUS
  Administered 2016-02-10: 20 ug/kg/min via INTRAVENOUS
  Administered 2016-02-10: 15 ug/kg/min via INTRAVENOUS
  Administered 2016-02-11: 20 ug/kg/min via INTRAVENOUS
  Administered 2016-02-12 (×2): 25 ug/kg/min via INTRAVENOUS
  Administered 2016-02-13 (×2): 30 ug/kg/min via INTRAVENOUS
  Administered 2016-02-13: 15 ug/kg/min via INTRAVENOUS
  Administered 2016-02-14: 30 ug/kg/min via INTRAVENOUS
  Administered 2016-02-14 – 2016-02-15 (×3): 30.059 ug/kg/min via INTRAVENOUS
  Administered 2016-02-15: 30 ug/kg/min via INTRAVENOUS
  Administered 2016-02-15: 30.059 ug/kg/min via INTRAVENOUS
  Administered 2016-02-16: 30 ug/kg/min via INTRAVENOUS
  Administered 2016-02-16 (×2): 30.059 ug/kg/min via INTRAVENOUS
  Administered 2016-02-16 – 2016-02-17 (×2): 30 ug/kg/min via INTRAVENOUS
  Administered 2016-02-17: 30.059 ug/kg/min via INTRAVENOUS
  Administered 2016-02-18 (×2): 30 ug/kg/min via INTRAVENOUS
  Administered 2016-02-19: 20 ug/kg/min via INTRAVENOUS
  Administered 2016-02-20 (×2): 30 ug/kg/min via INTRAVENOUS
  Administered 2016-02-20: 30.059 ug/kg/min via INTRAVENOUS
  Filled 2016-02-09 (×32): qty 100

## 2016-02-09 MED ORDER — NICOTINE 21 MG/24HR TD PT24
21.0000 mg | MEDICATED_PATCH | Freq: Every day | TRANSDERMAL | Status: DC
  Administered 2016-02-09 – 2016-02-20 (×12): 21 mg via TRANSDERMAL
  Filled 2016-02-09 (×12): qty 1

## 2016-02-09 NOTE — Progress Notes (Signed)
Morning ABG that were repeated from ventilator settings changes of tidal volume increased to 500 tidal volume and Fi02 to 30. Results called into RN @ [phone]. Results did not vary much but will let Dr. Luan Pulling know when he rounds this morning.Also will give results to RN coming onto shift this morning.  Aleysha Meckler Rica Mote, RN

## 2016-02-09 NOTE — Plan of Care (Signed)
Problem: Pain Managment: Goal: General experience of comfort will improve Outcome: Progressing Pt continued on fentanyl gtt with titration for Rass score of -2, bolus as needed per order. RN will continue to monitor

## 2016-02-09 NOTE — Progress Notes (Signed)
Pharmacy Antibiotic Note  [name] is a 76 y.o. male admitted on 02/02/2016 with sepsis.  Pharmacy has been consulted for Vancomycin and zosyn dosing.  Plan: Vancomycin 1250mg  loading dose, then 500mg  IV every 12 hours.  Goal trough 15-20 mcg/mL. Zosyn 3.375g IV q8h (4 hour infusion).  F/U cxs and clinical progress Levels as indicated and monitor V/S and labs  Height: 5\' 8"  (172.7 cm) Weight: 136 lb 14.5 oz (62.1 kg) IBW/kg (Calculated) : 68.4  Temp (24hrs), Avg:99.6 F (37.6 C), Min:98.8 F (37.1 C), Max:100.7 F (38.2 C)   Recent Labs Lab 01/20/2016 1523 02/03/2016 1559 02/08/16 0323  WBC  --  7.2 4.9  CREATININE 0.80 0.91 1.06  LATICACIDVEN 0.70  --  1.6    Estimated Creatinine Clearance: 52.9 mL/min (by C-G formula based on SCr of 1.06 mg/dL).    No Known Allergies  Antimicrobials this admission: Ceftriaxone 1/23 >> 1/25 Azithromycin 1/23 >> 1/25 Vancomycin 1/25>> Zosyn 1/25 >>  Dose adjustments this admission: N/a  Microbiology results: 1/25 BCx: pending 1/23 MRSA PCR: positive  Thank you for allowing pharmacy to be a part of this patient's care.  Isac Sarna, BS Pharm D, [place] Clinical Pharmacist Pager [phone] 02/09/2016 9:04 AM

## 2016-02-09 NOTE — Progress Notes (Signed)
Notified MD Hawkins of ABG results, no changes at this time. Will continue to monitor.

## 2016-02-09 NOTE — Progress Notes (Addendum)
MD called back to confirm irregular rhythm was MAT- Multiform atrial tachycardia. Orders to increase ventilation per ABG result and to repeat ABG around 0600-0700 this morning. Will continue to monitor for any new changes.Respiratory at bedside.  Ericka Pontiff, RN 4:52 AM 02/09/16

## 2016-02-09 NOTE — Progress Notes (Signed)
Notified MD Luan Pulling of ABG results, coordinated with respiratory therapists.

## 2016-02-09 NOTE — Progress Notes (Signed)
Pt has been in and out of an irregular rhythm while on my shift, it has come across on monitor as Afib but quickly goes out of that rhythm but stays in an irregular rhythm. MD made aware. He wanted me to print a strip out from computer for him to review in Pts chart and then possible EKG from there. Also, Pt's family informed me that Pt was a heavy smoker so when I called MD about the rhythm change I asked him about a nicotine patch and if that was appropriate at this time. No new orders, MD would rather have rounding docs in the morning review chart and make the decision for patch.   Ericka Pontiff, RN 4:33 AM 02/09/16

## 2016-02-09 NOTE — Progress Notes (Signed)
RN received ABG results from [facility], RRT at 0430. MD made aware of ABG critical result at 0435.   Ericka Pontiff, RN 4:37 AM 02/09/16

## 2016-02-09 NOTE — Progress Notes (Signed)
PROGRESS NOTE    [name]  XBL:[id number] DOB: 02-May-1940 DOA: 01/21/2016 PCP: Vic Blackbird, MD     Brief Narrative:  76 year old man admitted to the hospital on 1/23 due to shortness of breath, was intubated in the emergency department.   Assessment & Plan:   Principal Problem:   COPD exacerbation ([place]) Active Problems:   COPD (chronic obstructive pulmonary disease) ([place])   Essential hypertension, benign   Respiratory failure (HCC)   Acute on chronic respiratory failure (HCC)   Respiratory disorder with ventilator dependence (HCC)   Fever   Acute bronchitis   Acute on chronic hypoxemic respiratory failure, ventilatory dependent -Presumed due to COPD exacerbation, was febrile on admission so unable to completely exclude community-acquired pneumonia and is being treated empirically with antibiotics, despite inconclusive chest x-ray. -Influenza PCR is negative. -Continues to have significant respiratory acidosis, as per most recent ABG respiratory rate has been increased FiO2 decreased. -Continue steroids for COPD exacerbation, nebs. -Appreciate Dr. Luan Pulling input and recommendations.  Elevated troponins -Likely due to demand ischemia in the face of ongoing severe respiratory illness. -Check 2-D echo. -No plans for further aggressive cardiac workup at this point.  New-onset A. fib with RVR -Likely due to respiratory illness, rate is currently controlled. Echo requested   DVT prophylaxis: Lovenox Code Status: Full code Family Communication: No family at bedside Disposition Plan: Remains intubated today  Consultants:   Pulmonary, Dr. Luan Pulling  Procedures:   None  Antimicrobials:  Anti-infectives    Start     Dose/Rate Route Frequency Ordered Stop   02/09/16 2100  vancomycin (VANCOCIN) 500 mg in sodium chloride 0.9 % 100 mL IVPB     500 mg 100 mL/hr over 60 Minutes Intravenous Every 12 hours 02/09/16 0846     02/09/16 0900  piperacillin-tazobactam  (ZOSYN) IVPB 3.375 g     3.375 g 12.5 mL/hr over 240 Minutes Intravenous Every 8 hours 02/09/16 0846     02/09/16 0900  vancomycin (VANCOCIN) 1,250 mg in sodium chloride 0.9 % 250 mL IVPB     1,250 mg 166.7 mL/hr over 90 Minutes Intravenous  Once 02/09/16 0846 02/09/16 1108   02/08/16 2200  oseltamivir (TAMIFLU) capsule 75 mg  Status:  Discontinued     75 mg Oral 2 times daily 02/08/16 1653 02/08/16 1702   02/08/16 1715  oseltamivir (TAMIFLU) capsule 75 mg  Status:  Discontinued     75 mg Oral 2 times daily 02/08/16 1703 02/08/16 1703   02/08/16 1715  oseltamivir (TAMIFLU) capsule 30 mg  Status:  Discontinued     30 mg Oral 2 times daily 02/08/16 1703 02/09/16 0942   02/06/2016 2300  cefTRIAXone (ROCEPHIN) 1 g in dextrose 5 % 50 mL IVPB  Status:  Discontinued     1 g 100 mL/hr over 30 Minutes Intravenous Every 24 hours 01/22/2016 2100 02/09/16 0820   01/30/2016 2200  azithromycin (ZITHROMAX) 500 mg in dextrose 5 % 250 mL IVPB  Status:  Discontinued     500 mg 250 mL/hr over 60 Minutes Intravenous Every 24 hours 01/27/2016 2100 02/09/16 0820       Subjective: Profoundly sedated  Objective: Vitals:   02/09/16 1430 02/09/16 1500 02/09/16 1600 02/09/16 1603  BP: (!) 144/80 106/69 111/70   Pulse: (!) 110 100 97 96  Resp: 15 15 20 20   Temp: 99.1 F (37.3 C) 99.1 F (37.3 C) 99.1 F (37.3 C) 99.1 F (37.3 C)  TempSrc:      SpO2: 98% 99%  99% 99%  Weight:      Height:        Intake/Output Summary (Last 24 hours) at 02/09/16 1636 Last data filed at 02/09/16 1500  Gross per 24 hour  Intake          2693.66 ml  Output              925 ml  Net          1768.66 ml   Filed Weights   02/12/2016 2007 02/08/16 0500 02/09/16 0500  Weight: 57.6 kg (126 lb 15.8 oz) 57.6 kg (126 lb 15.8 oz) 62.1 kg (136 lb 14.5 oz)    Examination:  General exam: Intubated, sedated Respiratory system: Clear to auscultation. Respiratory effort normal. Cardiovascular system:RRR. No murmurs, rubs,  gallops. Gastrointestinal system: Abdomen is nondistended, soft and nontender. No organomegaly or masses felt. Normal bowel sounds heard. Central nervous system: Unable to fully assess as he is currently intubated, however moves all 4 spontaneously Extremities: No C/C/E, +pedal pulses Skin: No rashes, lesions or ulcers Psychiatry: Unable to assess as he is currently intubated    Data Reviewed: I have personally reviewed following labs and imaging studies  CBC:  Recent Labs Lab 01/19/2016 1523 02/02/2016 1559 02/08/16 0323  WBC  --  7.2 4.9  NEUTROABS  --  6.0  --   HGB 16.7 15.7 12.8*  HCT 49.0 48.7 39.2  MCV  --  98.4 98.5  PLT  --  154 572   Basic Metabolic Panel:  Recent Labs Lab 02/04/2016 1523 02/06/2016 1559 02/08/16 0323 02/09/16 0824  NA 143 142 144  --   K 6.3* 4.3 4.6  --   CL 106 104 111  --   CO2  --  30 24  --   GLUCOSE 120* 104* 161*  --   BUN 10 9 15   --   CREATININE 0.80 0.91 1.06  --   CALCIUM  --  8.5* 7.5*  --   MG  --   --   --  2.1  PHOS  --   --   --  3.8   GFR: Estimated Creatinine Clearance: 52.9 mL/min (by C-G formula based on SCr of 1.06 mg/dL). Liver Function Tests:  Recent Labs Lab 01/24/2016 1559  AST 24  ALT 10*  ALKPHOS 87  BILITOT 0.5  PROT 6.9  ALBUMIN 3.9   No results for input(s): LIPASE, AMYLASE in the last 168 hours. No results for input(s): AMMONIA in the last 168 hours. Coagulation Profile:  Recent Labs Lab 02/02/2016 1630  INR 1.02   Cardiac Enzymes:  Recent Labs Lab 02/09/16 0827 02/09/16 1500  TROPONINI 0.74* 0.86*   BNP (last 3 results) No results for input(s): PROBNP in the last 8760 hours. HbA1C: No results for input(s): HGBA1C in the last 72 hours. CBG:  Recent Labs Lab 02/04/2016 1506 02/09/16 0813 02/09/16 1051 02/09/16 1559  GLUCAP 127* 118* 110* 129*   Lipid Profile:  Recent Labs  02/09/16 0931  TRIG 106   Thyroid Function Tests: No results for input(s): TSH, T4TOTAL, FREET4, T3FREE,  THYROIDAB in the last 72 hours. Anemia Panel: No results for input(s): VITAMINB12, FOLATE, FERRITIN, TIBC, IRON, RETICCTPCT in the last 72 hours. Urine analysis:    Component Value Date/Time   COLORURINE YELLOW 01/26/2016 1544   APPEARANCEUR HAZY (A) 02/05/2016 1544   LABSPEC 1.015 02/05/2016 1544   PHURINE 5.0 01/26/2016 1544   GLUCOSEU NEGATIVE 01/23/2016 1544   HGBUR SMALL (A) 01/19/2016 1544  BILIRUBINUR NEGATIVE 01/21/2016 1544   KETONESUR 20 (A) 01/18/2016 1544   PROTEINUR 100 (A) 01/26/2016 1544   UROBILINOGEN 0.2 04/19/2014 1946   NITRITE NEGATIVE 01/24/2016 1544   LEUKOCYTESUR NEGATIVE 02/08/2016 1544   Sepsis Labs: '@LABRCNTIP'$ (procalcitonin:4,lacticidven:4)  ) Recent Results (from the past 240 hour(s))  MRSA PCR Screening     Status: Abnormal   Collection Time: 01/27/2016  8:30 PM  Result Value Ref Range Status   MRSA by PCR POSITIVE (A) NEGATIVE Final    Comment:        The GeneXpert MRSA Assay (FDA approved for NASAL specimens only), is one component of a comprehensive MRSA colonization surveillance program. It is not intended to diagnose MRSA infection nor to guide or monitor treatment for MRSA infections. RESULT CALLED TO, READ BACK BY AND VERIFIED WITH: ROBERTS,T ON 02/06/2016 AT 2255 BY LOY,C   Culture, blood (Routine X 2) w Reflex to ID Panel     Status: None (Preliminary result)   Collection Time: 02/09/16  8:24 AM  Result Value Ref Range Status   Specimen Description BLOOD LEFT HAND  Final   Special Requests BOTTLES DRAWN AEROBIC ONLY 6CC  Final   Culture PENDING  Incomplete   Report Status PENDING  Incomplete  Culture, blood (Routine X 2) w Reflex to ID Panel     Status: None (Preliminary result)   Collection Time: 02/09/16  9:31 AM  Result Value Ref Range Status   Specimen Description BLOOD RIGHT HAND  Final   Special Requests BOTTLES DRAWN AEROBIC AND ANAEROBIC 6cc  Final   Culture PENDING  Incomplete   Report Status PENDING  Incomplete          Radiology Studies: Ct Head Wo Contrast  Result Date: 01/29/2016 CLINICAL DATA:  Unresponsive.  Possible unintentional overdose. EXAM: CT HEAD WITHOUT CONTRAST TECHNIQUE: Contiguous axial images were obtained from the base of the skull through the vertex without intravenous contrast. COMPARISON:  Head CT 04/19/2014 and MRI brain 04/19/2014 FINDINGS: Brain: Stable age related cerebral atrophy, ventriculomegaly and periventricular white matter disease. No extra-axial fluid collections are identified. No CT findings for acute hemispheric infarction or intracranial hemorrhage. No mass lesions. The brainstem and cerebellum are normal. Vascular: Stable vascular calcifications. No hyperdense vessels or obvious aneurysm. Skull: No skull fracture or bone lesions. Sinuses/Orbits: Extensive chronic paranasal sinus disease mainly involving the ethmoid air cells, left maxillary sinus and left half of the sphenoid sinus. The mastoid air cells and middle ear cavities are clear. The globes are intact. Other: No scalp lesions or scalp hematoma. IMPRESSION: 1. Age related cerebral atrophy, ventriculomegaly and periventricular white matter disease, unchanged when compared to prior examination. 2. No acute intracranial findings or mass lesions. 3. Chronic sinus disease. Electronically Signed   By: Marijo Sanes M.D.   On: 02/09/2016 17:00   Dg Chest Port 1 View  Result Date: 02/09/2016 CLINICAL DATA:  Irregular heartbeat.  Chest pain . EXAM: PORTABLE CHEST 1 VIEW COMPARISON:  01/24/2016. FINDINGS: Endotracheal tube noted with tip 3 cm above the carina. NG tube noted with tip below left hemidiaphragm. Mild infiltrate in the right upper lobe. Questionable nodular density is noted in the right apex. Small bilateral pleural effusions. Apical pleural-parenchymal thickening and cystic changes again noted on the right. These changes are consistent with scarring. No pneumothorax. Heart size stable. IMPRESSION: 1.   Endotracheal tube and NG tube in good anatomic position. 2. Questionable nodule right upper lobe. Questionable right upper lobe infiltrate. Follow-up chest x-rays recommended to  demonstrate clearing. Electronically Signed   By: Marcello Moores  Register   On: 02/09/2016 08:11        Scheduled Meds: . chlorhexidine gluconate (MEDLINE KIT)  15 mL Mouth Rinse BID  . Chlorhexidine Gluconate Cloth  6 each Topical Q0600  . enoxaparin (LOVENOX) injection  40 mg Subcutaneous Q24H  . famotidine (PEPCID) IV  20 mg Intravenous Q12H  . fentaNYL (SUBLIMAZE) injection  50 mcg Intravenous Once  . ipratropium-albuterol  3 mL Nebulization Q6H  . mouth rinse  15 mL Mouth Rinse QID  . methylPREDNISolone (SOLU-MEDROL) injection  125 mg Intravenous Q6H  . mupirocin ointment  1 application Nasal BID  . piperacillin-tazobactam (ZOSYN)  IV  3.375 g Intravenous Q8H  . propofol  20 mg Intravenous Once  . propofol (DIPRIVAN) infusion  5-80 mcg/kg/min Intravenous Once  . vancomycin  500 mg Intravenous Q12H   Continuous Infusions: . sodium chloride 75 mL/hr at 02/09/16 0537  . feeding supplement (VITAL AF 1.2 CAL) 1,000 mL (02/09/16 1308)  . fentaNYL infusion INTRAVENOUS 100 mcg/hr (02/09/16 1244)  . propofol (DIPRIVAN) infusion 10 mcg/kg/min (02/09/16 1606)     LOS: 2 days    Critical care time spent: 45 minutes. Greater than 50% of this time was spent in direct contact with the patient coordinating care.     Lelon Frohlich, MD [facility] Pager [phone]  If 7PM-7AM, please contact night-coverage [email or web address] Password [facility] 02/09/2016, 4:36 PM

## 2016-02-09 NOTE — Consult Note (Signed)
Consultation Note Date: 02/09/2016   Patient Name: [name]  DOB: May 29, 1940  MRN: [id number]  Age / Sex: 76 y.o., male  PCP: Alycia Rossetti, MD Referring Physician: Koleen Nimrod Acost*  Reason for Consultation: Establishing goals of care and Psychosocial/spiritual support  HPI/Patient Profile: 76 y.o. male  with past medical history of COPD per wife intubated/ventilated 5 times, hypertension and high cholesterol, PVD, anxiety, arthritis, skin cancer admitted on 01/29/2016 with acute on chronic ventilator dependent respiratory failure due to COPD exacerbation.   Clinical Assessment and Goals of Care: [name] is resting quietly in bed. He is intubated/ventilated and sedated.  Wife, [name], is at bedside today. She and I talk about [name] functional status prior to this hospitalization. She shares that she had been helping him with bathing and dressing. He uses a cane for ambulation but has not fallen. She states their son has been doing the yard work for the last few years.  We talk about [name] chronic and acute health problems. I share a diagram of the chronic illness pathway.   [name] shares that her husband has been ventilated 5 other times with the longest duration being 9 days. I share my concern over his decompensation earlier in the day, his need for increased oxygen.  We also briefly talk about his increased troponin.  We talk about healthcare power of attorney briefly, she states that she has no legal power of attorney but she and [name] have 5 children together, he has 2 children from his 1st marriage.  We talk about advanced directives. I ask if [name] had talked about what he did and did not want during this time in his life. [name] states that he had started to talk about it. I share that we need a few more days to see how he's going to be  able to improve, she does not need to make any decisions at this point, I'm there to answer questions and be a support.  During our conversation [name] continues to lie on the couch in the room. She seems quite lethargic, and will occasionally close her eyes, as if drifting off to sleep.  Healthcare power of attorney NEXT OF KIN   SUMMARY OF RECOMMENDATIONS   continuous full code at this point, 24 to 48 hours for improvements or decline, continue code status discussions.   Code Status/Advance Care Planning:  Full code  Symptom Management:   per hospitalist  Palliative Prophylaxis:   Oral Care and Turn Reposition  Additional Recommendations (Limitations, Scope, Preferences):  Full Scope Treatment  Psycho-social/Spiritual:   Desire for further Chaplaincy support:no  Additional Recommendations: Caregiving  Support/Resources and ICU Family Guide  Prognosis:   < 6 months, would not be surprising based on functional decline, repeat intubation's/COPD exacerbations, decreased functional status prior to this hospitalization.  Discharge Planning: To Be Determined      Primary Diagnoses: Present on Admission: . Respiratory failure ([place]) . COPD (chronic obstructive pulmonary disease) ([place]) .  Essential hypertension, benign . COPD exacerbation ([place])   I have reviewed the medical record, interviewed the patient and family, and examined the patient. The following aspects are pertinent.  Past Medical History:  Diagnosis Date  . Anxiety   . Arthritis   . Cancer ([place])    skin  . Chest pain   . COPD (chronic obstructive pulmonary disease) ([place])    [facility]  . GERD (gastroesophageal reflux disease)   . Hyperlipidemia   . Hypertension   . Lung disease   . PVD (peripheral vascular disease) ([place])    RLE ABI 0.8   Social History   Social History  . Marital status: Married    Spouse name: N/A  . Number of children: N/A  . Years of education:  N/A   Social History Main Topics  . Smoking status: Current Some Day Smoker    Packs/day: 1.00    Types: Cigarettes, Cigars  . Smokeless tobacco: Current User    Types: Snuff  . Alcohol use No  . Drug use: No  . Sexual activity: Not Asked   Other Topics Concern  . None   Social History Narrative  . None   History reviewed. No pertinent family history. Scheduled Meds: . chlorhexidine gluconate (MEDLINE KIT)  15 mL Mouth Rinse BID  . Chlorhexidine Gluconate Cloth  6 each Topical Q0600  . enoxaparin (LOVENOX) injection  40 mg Subcutaneous Q24H  . famotidine (PEPCID) IV  20 mg Intravenous Q12H  . fentaNYL (SUBLIMAZE) injection  50 mcg Intravenous Once  . ipratropium-albuterol  3 mL Nebulization Q6H  . mouth rinse  15 mL Mouth Rinse QID  . methylPREDNISolone (SOLU-MEDROL) injection  125 mg Intravenous Q6H  . mupirocin ointment  1 application Nasal BID  . nicotine  21 mg Transdermal Daily  . piperacillin-tazobactam (ZOSYN)  IV  3.375 g Intravenous Q8H  . propofol  20 mg Intravenous Once  . propofol (DIPRIVAN) infusion  5-80 mcg/kg/min Intravenous Once  . vancomycin  500 mg Intravenous Q12H   Continuous Infusions: . sodium chloride 75 mL/hr at 02/09/16 0537  . feeding supplement (VITAL AF 1.2 CAL) 1,000 mL (02/09/16 1308)  . fentaNYL infusion INTRAVENOUS 100 mcg/hr (02/09/16 1244)  . propofol (DIPRIVAN) infusion 10 mcg/kg/min (02/09/16 1606)   PRN Meds:.acetaminophen, albuterol, fentaNYL, sodium chloride Medications Prior to Admission:  Prior to Admission medications   Medication Sig Start Date End Date Taking? Authorizing Provider  albuterol (PROVENTIL HFA;VENTOLIN HFA) 108 (90 Base) MCG/ACT inhaler Inhale 1-2 puffs into the lungs every 6 (six) hours as needed for wheezing or shortness of breath. 10/27/15  Yes Sherwood Gambler, MD  ALPRAZolam Duanne Moron) 1 MG tablet Take 1 tablet (1 mg total) by mouth 3 (three) times daily as needed for anxiety. 01/05/16  Yes Alycia Rossetti, MD    budesonide-formoterol [facility]) 160-4.5 MCG/ACT inhaler Inhale 2 puffs into the lungs 2 (two) times daily.   Yes Historical Provider, MD  FLUoxetine (PROZAC) 40 MG capsule Take 1 capsule (40 mg total) by mouth daily. 01/02/16  Yes Alycia Rossetti, MD  Ivermectin 0.5 % LOTN Apply thoroughly to dry hair and scalp; leave lotion on hair for 10 minutes, and then rinse with water. 01/05/16  Yes Susy Frizzle, MD  loratadine (CLARITIN) 10 MG tablet Take 1 tablet (10 mg total) by mouth daily. 07/06/15  Yes Alycia Rossetti, MD  omeprazole (PRILOSEC) 20 MG capsule take 1 capsule by mouth once daily 11/22/15  Yes Alycia Rossetti,  MD  propranolol (INDERAL) 10 MG tablet take 1 tablet by mouth twice a day for TREMOR 11/22/15  Yes Alycia Rossetti, MD  rosuvastatin (CRESTOR) 10 MG tablet take 1 tablet by mouth once daily 12/12/15  Yes Alycia Rossetti, MD  tiotropium (SPIRIVA) 18 MCG inhalation capsule Place 18 mcg into inhaler and inhale daily.   Yes Historical Provider, MD  albuterol (PROVENTIL) (2.5 MG/3ML) 0.083% nebulizer solution Take 3 mLs (2.5 mg total) by nebulization every 6 (six) hours as needed for wheezing or shortness of breath. 10/28/15   Alycia Rossetti, MD  amLODipine (NORVASC) 10 MG tablet Take 10 mg by mouth daily.    Historical Provider, MD  ibuprofen (ADVIL,MOTRIN) 200 MG tablet Take 200 mg by mouth every 6 (six) hours as needed for headache, mild pain or moderate pain. For pain     Historical Provider, MD  nitroGLYCERIN (NITROSTAT) 0.4 MG SL tablet Place 0.4 mg under the tongue every 5 (five) minutes as needed for chest pain.    Historical Provider, MD  vitamin B-12 (CYANOCOBALAMIN) 1000 MCG tablet Take 1,000 mcg by mouth daily.    Historical Provider, MD   No Known Allergies Review of Systems  Unable to perform ROS: Intubated    Physical Exam  Constitutional: No distress.  Frail and thin, chronically ill appearing  HENT:  Head: Normocephalic and atraumatic.  Cardiovascular:  Normal rate.   Pulmonary/Chest:  Intubated/ventilated  Abdominal: He exhibits no distension.  Firm abdomen  Musculoskeletal: He exhibits no edema.  Neurological:  Sedated  Skin: Skin is warm and dry.  Nursing note and vitals reviewed.   Vital Signs: BP 95/68   Pulse 90   Temp 99.2 F (37.3 C)   Resp 20   Ht 5' 8"  (1.727 m)   Wt 62.1 kg (136 lb 14.5 oz)   SpO2 100%   BMI 20.82 kg/m  Pain Assessment: CPOT (FLACC) POSS *See Group Information*: 2-Acceptable,Slightly drowsy, easily aroused     SpO2: SpO2: 100 % O2 Device:SpO2: 100 % O2 Flow Rate: .O2 Flow Rate (L/min): 12 L/min  IO: Intake/output summary:  Intake/Output Summary (Last 24 hours) at 02/09/16 1904 Last data filed at 02/09/16 1751  Gross per 24 hour  Intake          3314.16 ml  Output             1075 ml  Net          2239.16 ml    LBM: Last BM Date:  (PTA) Baseline Weight: Weight: 59.9 kg (132 lb) Most recent weight: Weight: 62.1 kg (136 lb 14.5 oz)     Palliative Assessment/Data:   Flowsheet Rows   Flowsheet Row Most Recent Value  Intake Tab  Referral Department  Hospitalist  Unit at Time of Referral  ICU  Palliative Care Primary Diagnosis  Pulmonary  Date Notified  02/09/16  Palliative Care Type  New Palliative care  Reason for referral  Clarify Goals of Care  Date of Admission  01/19/2016  Date first seen by Palliative Care  02/09/16  # of days Palliative referral response time  0 Day(s)  # of days IP prior to Palliative referral  2  Clinical Assessment  Palliative Performance Scale Score  10%  Pain Max last 24 hours  Not able to report  Pain Min Last 24 hours  Not able to report  Dyspnea Max Last 24 Hours  Not able to report  Dyspnea Min Last 24 hours  Not able  to report  Psychosocial & Spiritual Assessment  Palliative Care Outcomes  Patient/Family meeting held?  Yes  Who was at the meeting?  Wife, [name]  Palliative Care Outcomes  Provided psychosocial or spiritual support,  Clarified goals of care  Palliative Care follow-up planned  -- [Follow-up while at [facility]]      Time In: 1400 Time Out: 1510 Time Total: 70 minutes Greater than 50%  of this time was spent counseling and coordinating care related to the above assessment and plan.  Signed by: Drue Novel, NP   Please contact Palliative Medicine Team phone at [phone] for questions and concerns.  For individual provider: See Shea Evans

## 2016-02-09 NOTE — Progress Notes (Addendum)
After my initial note he had a chest x-ray which I was able to review on the x-ray machine that does not show any acute changes. I don't see pneumothorax but I will review the film and is in the PACS system. He has become hypoxic so I turned his oxygen up to 70%. He is still alarming the ventilator. His heart rate has now jumped up to the 130s. It looks more like atrial flutter on the monitor but I'm going to go ahead and get EKG. He spiked a temperature. I'm going to get blood cultures. Try to increase his sedation is getting more comfortably if his blood pressure will allow. Adjust his antibiotics since he is clearly worse despite current treatment.  Addendum: Heart rate now about 1:15. Oxygenation much better on 70%. Chest x-ray reviewed no pneumothorax. His blood pressure is in the 150s so I think we can put him back on different than to obtain sedation. EKG shows what looks like sinus tach now.  Total critical care time 1 hour

## 2016-02-09 NOTE — Progress Notes (Signed)
Pt's vent with high pressure alarm, sats 83-85%, HR 120-130s irregular per monitor. Pt suctioned and given 100% fio2 bolus, MD Luan Pulling notified and came in to assess patient. Temp from urinary catheter temp probe showing 101.1, MD aware. Will follow through with new orders.

## 2016-02-09 NOTE — Progress Notes (Signed)
Paged MD Jerilee Hoh of Troponin level.

## 2016-02-09 NOTE — Progress Notes (Signed)
Initial Nutrition Assessment  INTERVENTION:  Change tube feeding to Vital 1.2 @ 55 ml/hr (1320 ml daily) via OGT  Tube feeding regimen provides 1584 kcal,  99 grams of protein, and 1070 ml of H2O.    Sedative adds 98 kcal daily- 100% of energy and protein goal met.   NUTRITION DIAGNOSIS:   Inadequate oral intake related to inability to eat as evidenced by NPO status.   GOAL:   Provide needs based on ASPEN/SCCM guidelines   MONITOR:   TF tolerance, Labs, Vent status, Weight trends  REASON FOR ASSESSMENT: consult to adjust/manage tube feeds       ASSESSMENT:   Patient presents with dyspnea and a fever. He has hx of tobacco abuse, COPD, HTN. Chart reviewed -no family present. His weight hx is stable over the past 1.5 years. Patient is currently intubated on ventilator support due to acute respiratory failure. Tube feeds have been started via OGT.  MV: 8.4 L/min Temp (24hrs), Avg:99.9 F (37.7 C), Min:98.8 F (37.1 C), Max:101.1 F (38.4 C)  Propofol: 3.7 ml/hr (provides 98 kcal lipids every 24 hr at current rate)    Recent Labs Lab 01/29/2016 1523 02/06/2016 1559 02/08/16 0323 02/09/16 0824  NA 143 142 144  --   K 6.3* 4.3 4.6  --   CL 106 104 111  --   CO2  --  30 24  --   BUN 10 9 15   --   CREATININE 0.80 0.91 1.06  --   CALCIUM  --  8.5* 7.5*  --   MG  --   --   --  2.1  PHOS  --   --   --  3.8  GLUCOSE 120* 104* 161*  --    Labs/meds: reviewed  Diet Order:  Diet NPO time specified  Skin:  Reviewed, no issues  Last BM:  prior to admission  Height:   Ht Readings from Last 1 Encounters:  01/18/2016 5' 8"  (1.727 m)    Weight:   Wt Readings from Last 1 Encounters:  02/09/16 136 lb 14.5 oz (62.1 kg)    Ideal Body Weight:  70 kg  BMI:  Body mass index is 20.82 kg/m.  Estimated Nutritional Needs:   Kcal:  1678  Protein:  93-111 gr  Fluid:  1.6 liters daily  EDUCATION NEEDS:   No education needs identified at this time  Colman Cater  MS,RD,CSG,LDN Office: #[phone] Pager: [phone]

## 2016-02-09 NOTE — Progress Notes (Signed)
Notified MD Jerilee Hoh of troponin 0.74, face to face.  No new orders at this time.

## 2016-02-09 NOTE — Progress Notes (Signed)
Subjective: Events of last night noted. His pH this morning was 7.2 with a PCO2 in the 60s. Adjustments were made to his ventilator settings but it made very little difference in his blood gas. He is alarming on the ventilator and pushing past his pressure limit. Chest x-ray is pending.  Objective: Vital signs in last 24 hours: Temp:  [98.8 F (37.1 C)-100.7 F (38.2 C)] 100.7 F (38.2 C) (01/25 0724) Pulse Rate:  [93-116] 103 (01/25 0724) Resp:  [8-22] 15 (01/25 0724) BP: (95-126)/(57-87) 105/62 (01/25 0500) SpO2:  [95 %-99 %] 97 % (01/25 0724) FiO2 (%):  [30 %-35 %] 30 % (01/25 0452) Weight:  [62.1 kg (136 lb 14.5 oz)] 62.1 kg (136 lb 14.5 oz) (01/25 0500) Weight change: 2.225 kg (4 lb 14.5 oz) Last BM Date:  (PTA)  Intake/Output from previous day: 01/24 0701 - 01/25 0700 In: 989.3 [I.V.:939.3; IV Piggyback:50] Out: 850 [Urine:650; Emesis/NG output:200]  PHYSICAL EXAM General appearance: Intubated sedated on mechanical ventilation. He's doing some breath stacking. Resp: rhonchi bilaterally Cardio: Heart is irregular and it looks on the monitor like this is multifocal atrial tach GI: soft, non-tender; bowel sounds normal; no masses,  no organomegaly Extremities: extremities normal, atraumatic, no cyanosis or edema Mucous membranes still somewhat dry. He does open his eyes.  Lab Results:  Results for orders placed or performed during the hospital encounter of 02/06/2016 (from the past 48 hour(s))  Glucose, capillary     Status: Abnormal   Collection Time: 01/17/2016  3:06 PM  Result Value Ref Range   Glucose-Capillary 127 (H) 65 - 99 mg/dL  I-Stat CG4 Lactic Acid, ED     Status: None   Collection Time: 02/12/2016  3:23 PM  Result Value Ref Range   Lactic Acid, Venous 0.70 0.5 - 1.9 mmol/L  I-stat chem 8, ed     Status: Abnormal   Collection Time: 02/13/2016  3:23 PM  Result Value Ref Range   Sodium 143 135 - 145 mmol/L   Potassium 6.3 (HH) 3.5 - 5.1 mmol/L   Chloride 106 101 -  111 mmol/L   BUN 10 6 - 20 mg/dL   Creatinine, Ser 0.80 0.61 - 1.24 mg/dL   Glucose, Bld 120 (H) 65 - 99 mg/dL   Calcium, Ion 1.10 (L) 1.15 - 1.40 mmol/L   TCO2 33 0 - 100 mmol/L   Hemoglobin 16.7 13.0 - 17.0 g/dL   HCT 49.0 39.0 - 52.0 %   Comment NOTIFIED PHYSICIAN   Urinalysis, Routine w reflex microscopic     Status: Abnormal   Collection Time: 01/22/2016  3:44 PM  Result Value Ref Range   Color, Urine YELLOW YELLOW   APPearance HAZY (A) CLEAR   Specific Gravity, Urine 1.015 1.005 - 1.030   pH 5.0 5.0 - 8.0   Glucose, UA NEGATIVE NEGATIVE mg/dL   Hgb urine dipstick SMALL (A) NEGATIVE   Bilirubin Urine NEGATIVE NEGATIVE   Ketones, ur 20 (A) NEGATIVE mg/dL   Protein, ur 100 (A) NEGATIVE mg/dL   Nitrite NEGATIVE NEGATIVE   Leukocytes, UA NEGATIVE NEGATIVE   RBC / HPF 0-5 0 - 5 RBC/hpf   WBC, UA 0-5 0 - 5 WBC/hpf   Bacteria, UA RARE (A) NONE SEEN   Hyaline Casts, UA PRESENT   Brain natriuretic peptide     Status: None   Collection Time: 02/05/2016  3:59 PM  Result Value Ref Range   B Natriuretic Peptide 60.0 0.0 - 100.0 pg/mL  CBC with Differential/Platelet       Status: None   Collection Time: 01/31/2016  3:59 PM  Result Value Ref Range   WBC 7.2 4.0 - 10.5 K/uL   RBC 4.95 4.22 - 5.81 MIL/uL   Hemoglobin 15.7 13.0 - 17.0 g/dL   HCT 48.7 39.0 - 52.0 %   MCV 98.4 78.0 - 100.0 fL   MCH 31.7 26.0 - 34.0 pg   MCHC 32.2 30.0 - 36.0 g/dL   RDW 14.4 11.5 - 15.5 %   Platelets 154 150 - 400 K/uL    Comment: SPECIMEN CHECKED FOR CLOTS PLATELET COUNT CONFIRMED BY SMEAR    Neutrophils Relative % 84 %   Neutro Abs 6.0 1.7 - 7.7 K/uL   Lymphocytes Relative 10 %   Lymphs Abs 0.7 0.7 - 4.0 K/uL   Monocytes Relative 6 %   Monocytes Absolute 0.4 0.1 - 1.0 K/uL   Eosinophils Relative 1 %   Eosinophils Absolute 0.1 0.0 - 0.7 K/uL   Basophils Relative 0 %   Basophils Absolute 0.0 0.0 - 0.1 K/uL  Comprehensive metabolic panel     Status: Abnormal   Collection Time: 02/10/2016  3:59 PM   Result Value Ref Range   Sodium 142 135 - 145 mmol/L   Potassium 4.3 3.5 - 5.1 mmol/L    Comment: DELTA CHECK NOTED   Chloride 104 101 - 111 mmol/L   CO2 30 22 - 32 mmol/L   Glucose, Bld 104 (H) 65 - 99 mg/dL   BUN 9 6 - 20 mg/dL   Creatinine, Ser 0.91 0.61 - 1.24 mg/dL   Calcium 8.5 (L) 8.9 - 10.3 mg/dL   Total Protein 6.9 6.5 - 8.1 g/dL   Albumin 3.9 3.5 - 5.0 g/dL   AST 24 15 - 41 U/L   ALT 10 (L) 17 - 63 U/L   Alkaline Phosphatase 87 38 - 126 U/L   Total Bilirubin 0.5 0.3 - 1.2 mg/dL   GFR calc non Af Amer >60 >60 mL/min   GFR calc Af Amer >60 >60 mL/min    Comment: (NOTE) The eGFR has been calculated using the CKD EPI equation. This calculation has not been validated in all clinical situations. eGFR's persistently <60 mL/min signify possible Chronic Kidney Disease.    Anion gap 8 5 - 15  Protime-INR     Status: None   Collection Time: 01/22/2016  4:30 PM  Result Value Ref Range   Prothrombin Time 13.4 11.4 - 15.2 seconds   INR 1.02   Blood gas, arterial ([facility] & [facility] ONLY)     Status: Abnormal   Collection Time: 02/15/2016  4:55 PM  Result Value Ref Range   FIO2 40.00    Delivery systems VENTILATOR    Mode PRESSURE REGULATED VOLUME CONTROL    VT 430 mL   LHR 16 resp/min   Peep/cpap 5.0 cm H20   pH, Arterial 7.239 (L) 7.350 - 7.450   pCO2 arterial 64.2 (H) 32.0 - 48.0 mmHg    Comment: CRITICAL RESULT CALLED TO, READ BACK BY AND VERIFIED WITH: PATRAW,B.RN AT CRITICAL RESULT CALLED TO, READ BACK BY AND VERIFIED WITH: PATRAW,B.RN AT 1713 01/26/2016 BY BROADNAX,L.RRT    pO2, Arterial 253.00 (H) 83.0 - 108.0 mmHg   Bicarbonate 22.6 20.0 - 28.0 mmol/L   Acid-base deficit 0.1 0.0 - 2.0 mmol/L   O2 Saturation 99.2 %   Collection site RIGHT RADIAL    Drawn by [id number]    Sample type ARTERIAL    Allens test (pass/fail) PASS PASS  MRSA   PCR Screening     Status: Abnormal   Collection Time: 01/23/2016  8:30 PM  Result Value Ref Range   MRSA by PCR POSITIVE (A) NEGATIVE    Comment:         The GeneXpert MRSA Assay (FDA approved for NASAL specimens only), is one component of a comprehensive MRSA colonization surveillance program. It is not intended to diagnose MRSA infection nor to guide or monitor treatment for MRSA infections. RESULT CALLED TO, READ BACK BY AND VERIFIED WITH: ROBERTS,T ON 01/31/2016 AT 2255 BY LOY,C   Blood gas, arterial     Status: Abnormal   Collection Time: 02/04/2016  9:00 PM  Result Value Ref Range   FIO2 35.00    Delivery systems VENTILATOR    Mode PRESSURE REGULATED VOLUME CONTROL    VT 450 mL   LHR 18.0 resp/min   Peep/cpap 5.0 cm H20   pH, Arterial 7.284 (L) 7.350 - 7.450   pCO2 arterial 55.8 (H) 32.0 - 48.0 mmHg   pO2, Arterial 95.3 83.0 - 108.0 mmHg   Bicarbonate 22.8 20.0 - 28.0 mmol/L   Acid-base deficit 0.2 0.0 - 2.0 mmol/L   O2 Saturation 96.9 %   Patient temperature 37.0    Collection site RIGHT RADIAL    Drawn by [id number]    Sample type ARTERIAL    Allens test (pass/fail) PASS PASS  Basic metabolic panel     Status: Abnormal   Collection Time: 02/08/16  3:23 AM  Result Value Ref Range   Sodium 144 135 - 145 mmol/L   Potassium 4.6 3.5 - 5.1 mmol/L   Chloride 111 101 - 111 mmol/L   CO2 24 22 - 32 mmol/L   Glucose, Bld 161 (H) 65 - 99 mg/dL   BUN 15 6 - 20 mg/dL   Creatinine, Ser 1.06 0.61 - 1.24 mg/dL   Calcium 7.5 (L) 8.9 - 10.3 mg/dL   GFR calc non Af Amer >60 >60 mL/min   GFR calc Af Amer >60 >60 mL/min    Comment: (NOTE) The eGFR has been calculated using the CKD EPI equation. This calculation has not been validated in all clinical situations. eGFR's persistently <60 mL/min signify possible Chronic Kidney Disease.    Anion gap 9 5 - 15  CBC     Status: Abnormal   Collection Time: 02/08/16  3:23 AM  Result Value Ref Range   WBC 4.9 4.0 - 10.5 K/uL   RBC 3.98 (L) 4.22 - 5.81 MIL/uL   Hemoglobin 12.8 (L) 13.0 - 17.0 g/dL   HCT 39.2 39.0 - 52.0 %   MCV 98.5 78.0 - 100.0 fL   MCH 32.2 26.0 - 34.0 pg   MCHC  32.7 30.0 - 36.0 g/dL   RDW 14.7 11.5 - 15.5 %   Platelets 152 150 - 400 K/uL  Lactic acid, plasma     Status: None   Collection Time: 02/08/16  3:23 AM  Result Value Ref Range   Lactic Acid, Venous 1.6 0.5 - 1.9 mmol/L  Blood gas, arterial     Status: Abnormal   Collection Time: 02/08/16 10:15 AM  Result Value Ref Range   FIO2 35.00    Delivery systems VENTILATOR    Mode CONTINUOUS POSITIVE AIRWAY PRESSURE    Peep/cpap 5.0 cm H20   Pressure support 10.0 cm H20   pH, Arterial 7.182 (LL) 7.350 - 7.450    Comment: CRITICAL RESULT CALLED TO, READ BACK BY AND VERIFIED WITH: LYLE FREEMAN,RN ON 02/08/16 BY  JENNIFER ROWLAND,RRT AT 2703.    pCO2 arterial 71.1 (HH) 32.0 - 48.0 mmHg    Comment: CRITICAL RESULT CALLED TO, READ BACK BY AND VERIFIED WITH: LYLE FREEMAN,RN ON 02/08/16 BY JENNIFER ROWLAND,RRT AT 1029.    pO2, Arterial 90.6 83.0 - 108.0 mmHg   Bicarbonate 21.1 20.0 - 28.0 mmol/L   Acid-base deficit 1.8 0.0 - 2.0 mmol/L   O2 Saturation 95.3 %   Patient temperature 37.0    Collection site RIGHT RADIAL    Drawn by [id number]    Sample type ARTERIAL DRAW    Allens test (pass/fail) PASS PASS  Influenza panel by PCR (type A & B)     Status: None   Collection Time: 02/08/16  4:52 PM  Result Value Ref Range   Influenza A By PCR NEGATIVE NEGATIVE   Influenza B By PCR NEGATIVE NEGATIVE    Comment: (NOTE) The Xpert Xpress Flu assay is intended as an aid in the diagnosis of  influenza and should not be used as a sole basis for treatment.  This  assay is FDA approved for nasopharyngeal swab specimens only. Nasal  washings and aspirates are unacceptable for Xpert Xpress Flu testing.   Blood gas, arterial     Status: Abnormal   Collection Time: 02/09/16  4:10 AM  Result Value Ref Range   FIO2 35.00    Delivery systems VENTILATOR    Mode PRESSURE REGULATED VOLUME CONTROL    VT 450 mL   LHR 18 resp/min   Peep/cpap 5.0 cm H20   pH, Arterial 7.220 (L) 7.350 - 7.450   pCO2 arterial 60.4  (H) 32.0 - 48.0 mmHg   pO2, Arterial 112.0 (H) 83.0 - 108.0 mmHg   Bicarbonate 20.9 20.0 - 28.0 mmol/L   Acid-base deficit 3.0 (H) 0.0 - 2.0 mmol/L   O2 Saturation 97.2 %   Patient temperature 37.8    Collection site RIGHT RADIAL    Drawn by [id number]    Sample type ARTERIAL    Allens test (pass/fail) PASS PASS  Blood gas, arterial     Status: Abnormal   Collection Time: 02/09/16  6:00 AM  Result Value Ref Range   FIO2 30.00    Delivery systems VENTILATOR    Mode PRESSURE REGULATED VOLUME CONTROL    VT 500 mL   LHR 18.0 resp/min   Peep/cpap 5.0 cm H20   pH, Arterial 7.226 (L) 7.350 - 7.450   pCO2 arterial 60.4 (H) 32.0 - 48.0 mmHg   pO2, Arterial 88.8 83.0 - 108.0 mmHg   Bicarbonate 20.7 20.0 - 28.0 mmol/L   Acid-base deficit 2.6 (H) 0.0 - 2.0 mmol/L   O2 Saturation 95.4 %   Patient temperature 37.8    Collection site RIGHT RADIAL    Drawn by [id number]    Sample type ARTERIAL    Allens test (pass/fail) PASS PASS    ABGS  Recent Labs  01/19/2016 1523  02/09/16 0600  PHART  --   < > 7.226*  PO2ART  --   < > 88.8  TCO2 33  --   --   HCO3  --   < > 20.7  < > = values in this interval not displayed. CULTURES Recent Results (from the past 240 hour(s))  MRSA PCR Screening     Status: Abnormal   Collection Time: 02/04/2016  8:30 PM  Result Value Ref Range Status   MRSA by PCR POSITIVE (A) NEGATIVE Final    Comment:  The GeneXpert MRSA Assay (FDA approved for NASAL specimens only), is one component of a comprehensive MRSA colonization surveillance program. It is not intended to diagnose MRSA infection nor to guide or monitor treatment for MRSA infections. RESULT CALLED TO, READ BACK BY AND VERIFIED WITH: ROBERTS,T ON 01/19/2016 AT 2255 BY LOY,C    Studies/Results: Ct Head Wo Contrast  Result Date: 02/08/2016 CLINICAL DATA:  Unresponsive.  Possible unintentional overdose. EXAM: CT HEAD WITHOUT CONTRAST TECHNIQUE: Contiguous axial images were obtained from the base of  the skull through the vertex without intravenous contrast. COMPARISON:  Head CT 04/19/2014 and MRI brain 04/19/2014 FINDINGS: Brain: Stable age related cerebral atrophy, ventriculomegaly and periventricular white matter disease. No extra-axial fluid collections are identified. No CT findings for acute hemispheric infarction or intracranial hemorrhage. No mass lesions. The brainstem and cerebellum are normal. Vascular: Stable vascular calcifications. No hyperdense vessels or obvious aneurysm. Skull: No skull fracture or bone lesions. Sinuses/Orbits: Extensive chronic paranasal sinus disease mainly involving the ethmoid air cells, left maxillary sinus and left half of the sphenoid sinus. The mastoid air cells and middle ear cavities are clear. The globes are intact. Other: No scalp lesions or scalp hematoma. IMPRESSION: 1. Age related cerebral atrophy, ventriculomegaly and periventricular white matter disease, unchanged when compared to prior examination. 2. No acute intracranial findings or mass lesions. 3. Chronic sinus disease. Electronically Signed   By: Marijo Sanes M.D.   On: 01/28/2016 17:00   Dg Chest Port 1 View  Result Date: 02/06/2016 CLINICAL DATA:  Respiratory failure EXAM: PORTABLE CHEST 1 VIEW COMPARISON:  10/27/2015 FINDINGS: Endotracheal tube placed. Tip is 4.5 cm from the carina. NG tube tip is beyond the gastroesophageal junction. Lungs are hyperaerated. New nodular density at the right apex measures less than 1 cm. No pneumothorax. Small right pleural effusion. IMPRESSION: Endotracheal and NG tubes placed. Subcentimeter nodule at the right apex has developed. Small right pleural effusion. Electronically Signed   By: Marybelle Killings M.D.   On: 01/17/2016 16:09    Medications:  Prior to Admission:  Prescriptions Prior to Admission  Medication Sig Dispense Refill Last Dose  . albuterol (PROVENTIL HFA;VENTOLIN HFA) 108 (90 Base) MCG/ACT inhaler Inhale 1-2 puffs into the lungs every 6 (six)  hours as needed for wheezing or shortness of breath. 1 Inhaler 0 Past Month at Unknown time  . ALPRAZolam (XANAX) 1 MG tablet Take 1 tablet (1 mg total) by mouth 3 (three) times daily as needed for anxiety. 90 tablet 1 Past Month at Unknown time  . budesonide-formoterol (SYMBICORT) 160-4.5 MCG/ACT inhaler Inhale 2 puffs into the lungs 2 (two) times daily.   Past Month at Unknown time  . FLUoxetine (PROZAC) 40 MG capsule Take 1 capsule (40 mg total) by mouth daily. 30 capsule 3 Past Month at Unknown time  . Ivermectin 0.5 % LOTN Apply thoroughly to dry hair and scalp; leave lotion on hair for 10 minutes, and then rinse with water. 1 Tube 1 Past Month at Unknown time  . loratadine (CLARITIN) 10 MG tablet Take 1 tablet (10 mg total) by mouth daily. 30 tablet 11 Past Month at Unknown time  . omeprazole (PRILOSEC) 20 MG capsule take 1 capsule by mouth once daily 30 capsule 3 Past Month at Unknown time  . propranolol (INDERAL) 10 MG tablet take 1 tablet by mouth twice a day for TREMOR 60 tablet 3 Past Month at Unknown time  . rosuvastatin (CRESTOR) 10 MG tablet take 1 tablet by mouth once daily 30  tablet 3 Past Month at Unknown time  . tiotropium (SPIRIVA) 18 MCG inhalation capsule Place 18 mcg into inhaler and inhale daily.   Past Month at Unknown time  . albuterol (PROVENTIL) (2.5 MG/3ML) 0.083% nebulizer solution Take 3 mLs (2.5 mg total) by nebulization every 6 (six) hours as needed for wheezing or shortness of breath. 150 mL 1   . amLODipine (NORVASC) 10 MG tablet Take 10 mg by mouth daily.   10/27/2015 at Unknown time  . ibuprofen (ADVIL,MOTRIN) 200 MG tablet Take 200 mg by mouth every 6 (six) hours as needed for headache, mild pain or moderate pain. For pain    10/26/2015 at 1700  . nitroGLYCERIN (NITROSTAT) 0.4 MG SL tablet Place 0.4 mg under the tongue every 5 (five) minutes as needed for chest pain.   Past Month at Unknown time  . vitamin B-12 (CYANOCOBALAMIN) 1000 MCG tablet Take 1,000 mcg by  mouth daily.   10/27/2015 at Unknown time  . [DISCONTINUED] predniSONE (DELTASONE) 20 MG tablet 2 tabs po daily x 4 days 8 tablet 0    Scheduled: . azithromycin  500 mg Intravenous Q24H  . cefTRIAXone (ROCEPHIN)  IV  1 g Intravenous Q24H  . chlorhexidine gluconate (MEDLINE KIT)  15 mL Mouth Rinse BID  . enoxaparin (LOVENOX) injection  40 mg Subcutaneous Q24H  . famotidine (PEPCID) IV  20 mg Intravenous Q12H  . feeding supplement (PRO-STAT SUGAR FREE 64)  30 mL Per Tube BID  . feeding supplement (VITAL HIGH PROTEIN)  1,000 mL Per Tube Q24H  . fentaNYL (SUBLIMAZE) injection  50 mcg Intravenous Once  . ipratropium-albuterol  3 mL Nebulization Q6H  . mouth rinse  15 mL Mouth Rinse QID  . methylPREDNISolone (SOLU-MEDROL) injection  60 mg Intravenous Q8H  . oseltamivir  30 mg Oral BID  . propofol  20 mg Intravenous Once  . propofol (DIPRIVAN) infusion  5-80 mcg/kg/min Intravenous Once   Continuous: . sodium chloride 75 mL/hr at 02/09/16 0537  . fentaNYL infusion INTRAVENOUS 300 mcg/hr (02/09/16 0556)   BOF:BPZWCHENI, fentaNYL, sodium chloride  Assesment:He was admitted with acute on chronic respiratory failure requiring ventilator support. He has COPD exacerbation. He had been taking large amounts of Xanax per the chart and was somnolent on admission. He still has significant respiratory acidemia. Adjustments in the ventilator didn't make much difference but I think some of its because he is reaching his pressure limit. We don't have any opportunity of weaning today so I like to see what we can do if we can get him better sedated and then see what his blood gases do. Principal Problem:   COPD exacerbation ([place]) Active Problems:   COPD (chronic obstructive pulmonary disease) ([place])   Essential hypertension, benign   Respiratory failure ([facility])   Acute on chronic respiratory failure ([facility])   Respiratory disorder with ventilator dependence ([place])   Fever   Acute bronchitis    Plan: Continue  treatments. No opportunity for weaning. He doesn't seem to have a lot of bronchospasm causing the elevated ventilator pressures but I'm going to go ahead and increase his steroids. Check chest x-ray to make sure he hasn't developed some other issue. Start tube feedings    LOS: 2 days   Jag Lenz L 02/09/2016, 7:49 AM

## 2016-02-10 ENCOUNTER — Inpatient Hospital Stay (HOSPITAL_COMMUNITY): Payer: Medicare HMO

## 2016-02-10 DIAGNOSIS — I209 Angina pectoris, unspecified: Secondary | ICD-10-CM

## 2016-02-10 LAB — CBC
HCT: 39.5 % (ref 39.0–52.0)
Hemoglobin: 12.5 g/dL — ABNORMAL LOW (ref 13.0–17.0)
MCH: 31.8 pg (ref 26.0–34.0)
MCHC: 31.6 g/dL (ref 30.0–36.0)
MCV: 100.5 fL — ABNORMAL HIGH (ref 78.0–100.0)
Platelets: 182 10*3/uL (ref 150–400)
RBC: 3.93 MIL/uL — ABNORMAL LOW (ref 4.22–5.81)
RDW: 15.3 % (ref 11.5–15.5)
WBC: 6.5 10*3/uL (ref 4.0–10.5)

## 2016-02-10 LAB — ECHOCARDIOGRAM COMPLETE
Height: 68 in
Weight: 2190.49 oz

## 2016-02-10 LAB — GLUCOSE, CAPILLARY
GLUCOSE-CAPILLARY: 166 mg/dL — AB (ref 65–99)
GLUCOSE-CAPILLARY: 149 mg/dL — AB (ref 65–99)
GLUCOSE-CAPILLARY: 178 mg/dL — AB (ref 65–99)
Glucose-Capillary: 173 mg/dL — ABNORMAL HIGH (ref 65–99)
Glucose-Capillary: 188 mg/dL — ABNORMAL HIGH (ref 65–99)

## 2016-02-10 LAB — BLOOD GAS, ARTERIAL
Acid-base deficit: 4.3 mmol/L — ABNORMAL HIGH (ref 0.0–2.0)
Acid-base deficit: 4.3 mmol/L — ABNORMAL HIGH (ref 0.0–2.0)
Bicarbonate: 19.7 mmol/L — ABNORMAL LOW (ref 20.0–28.0)
Bicarbonate: 20.2 mmol/L (ref 20.0–28.0)
DRAWN BY: 21310
DRAWN BY: 221791
FIO2: 50
FIO2: 45
MECHVT: 500 mL
O2 SAT: 98.9 %
O2 SAT: 90.3 %
PATIENT TEMPERATURE: 37
PATIENT TEMPERATURE: 37
PCO2 ART: 55.8 mmHg — AB (ref 32.0–48.0)
PCO2 ART: 48.5 mmHg — AB (ref 32.0–48.0)
PEEP: 5 cmH2O
PEEP: 5 cmH2O
PH ART: 7.225 — AB (ref 7.350–7.450)
PH ART: 7.27 — AB (ref 7.350–7.450)
PO2 ART: 199 mmHg — AB (ref 83.0–108.0)
PO2 ART: 62.9 mmHg — AB (ref 83.0–108.0)
RATE: 20 resp/min
RATE: 22 resp/min
VT: 500 mL

## 2016-02-10 LAB — BASIC METABOLIC PANEL
Anion gap: 6 (ref 5–15)
BUN: 49 mg/dL — AB (ref 6–20)
CO2: 23 mmol/L (ref 22–32)
Calcium: 7.7 mg/dL — ABNORMAL LOW (ref 8.9–10.3)
Chloride: 113 mmol/L — ABNORMAL HIGH (ref 101–111)
Creatinine, Ser: 1.58 mg/dL — ABNORMAL HIGH (ref 0.61–1.24)
GFR calc Af Amer: 48 mL/min — ABNORMAL LOW (ref >60)
GFR, EST NON AFRICAN AMERICAN: 41 mL/min — AB (ref >60)
GLUCOSE: 172 mg/dL — AB (ref 65–99)
Potassium: 4.3 mmol/L (ref 3.5–5.1)
Sodium: 142 mmol/L (ref 135–145)

## 2016-02-10 LAB — PHOSPHORUS
PHOSPHORUS: 3.5 mg/dL (ref 2.5–4.6)
PHOSPHORUS: 3.2 mg/dL (ref 2.5–4.6)

## 2016-02-10 LAB — MAGNESIUM
MAGNESIUM: 2.3 mg/dL (ref 1.7–2.4)
MAGNESIUM: 2.3 mg/dL (ref 1.7–2.4)

## 2016-02-10 MED ORDER — VANCOMYCIN HCL IN DEXTROSE 1-5 GM/200ML-% IV SOLN
1000.0000 mg | INTRAVENOUS | Status: DC
  Administered 2016-02-10 – 2016-02-16 (×7): 1000 mg via INTRAVENOUS
  Filled 2016-02-10 (×7): qty 200

## 2016-02-10 MED ORDER — VITAL AF 1.2 CAL PO LIQD
1000.0000 mL | ORAL | Status: DC
  Administered 2016-02-15 – 2016-02-20 (×4): 1000 mL
  Filled 2016-02-10 (×14): qty 1000

## 2016-02-10 MED ORDER — FENTANYL CITRATE (PF) 2500 MCG/50ML IJ SOLN
INTRAMUSCULAR | Status: AC
  Filled 2016-02-10: qty 50

## 2016-02-10 NOTE — Plan of Care (Signed)
Problem: Pain Managment: Goal: General experience of comfort will improve Outcome: Progressing Pt provided with pain medication via gtt. Hourly monitoring to assess for any new s/s related to possible pain (ex: grimacing). Reposition Q2 hours per protocol and also as needed to provide optimal comfort and for pt while sedated.Bolus option for gtt for breakthrough pain. RN to continue to monitior.  Aliveah Gallant Rica Mote, RN

## 2016-02-10 NOTE — Progress Notes (Signed)
*  PRELIMINARY RESULTS* Echocardiogram 2D Echocardiogram has been performed.  Leavy Cella 02/10/2016, 10:10 AM

## 2016-02-10 NOTE — Progress Notes (Signed)
PROGRESS NOTE    [name]  JKD:[id number] DOB: 1940/05/10 DOA: 01/28/2016 PCP: Vic Blackbird, MD     Brief Narrative:  76 year old man admitted to the hospital on 1/23 due to shortness of breath, was intubated in the emergency department.   Assessment & Plan:   Principal Problem:   COPD exacerbation ([place]) Active Problems:   COPD (chronic obstructive pulmonary disease) ([place])   Essential hypertension, benign   Respiratory failure (HCC)   Acute on chronic respiratory failure with hypercapnia (HCC)   Respiratory disorder with ventilator dependence (HCC)   Fever   Acute bronchitis   Palliative care encounter   Goals of care, counseling/discussion   DNR (do not resuscitate) discussion   Acute on chronic hypoxemic respiratory failure, ventilatory dependent -Presumed due to COPD exacerbation, was febrile on admission so unable to completely exclude community-acquired pneumonia and is being treated empirically with antibiotics, despite inconclusive chest x-ray. -Influenza PCR is negative. -Continues to have significant respiratory acidosis, Dr. Luan Pulling is assisting with ventilator management. -Continue steroids for COPD exacerbation, nebs. -Appreciate Dr. Luan Pulling input and recommendations.  Elevated troponins -Likely due to demand ischemia in the face of ongoing severe respiratory illness. Trop with a high of 2.18. -Check 2-D echo. -No plans for further aggressive cardiac workup at this point.  New-onset A. fib with RVR -Likely due to respiratory illness, rate is currently controlled. Echo requested   DVT prophylaxis: Lovenox Code Status: Full code Family Communication: No family at bedside Disposition Plan: Remains intubated today  Consultants:   Pulmonary, Dr. Luan Pulling  Procedures:   None  Antimicrobials:  Anti-infectives    Start     Dose/Rate Route Frequency Ordered Stop   02/09/16 2100  vancomycin (VANCOCIN) 500 mg in sodium chloride 0.9 % 100 mL IVPB      500 mg 100 mL/hr over 60 Minutes Intravenous Every 12 hours 02/09/16 0846     02/09/16 0900  piperacillin-tazobactam (ZOSYN) IVPB 3.375 g     3.375 g 12.5 mL/hr over 240 Minutes Intravenous Every 8 hours 02/09/16 0846     02/09/16 0900  vancomycin (VANCOCIN) 1,250 mg in sodium chloride 0.9 % 250 mL IVPB     1,250 mg 166.7 mL/hr over 90 Minutes Intravenous  Once 02/09/16 0846 02/09/16 1108   02/08/16 2200  oseltamivir (TAMIFLU) capsule 75 mg  Status:  Discontinued     75 mg Oral 2 times daily 02/08/16 1653 02/08/16 1702   02/08/16 1715  oseltamivir (TAMIFLU) capsule 75 mg  Status:  Discontinued     75 mg Oral 2 times daily 02/08/16 1703 02/08/16 1703   02/08/16 1715  oseltamivir (TAMIFLU) capsule 30 mg  Status:  Discontinued     30 mg Oral 2 times daily 02/08/16 1703 02/09/16 0942   02/06/2016 2300  cefTRIAXone (ROCEPHIN) 1 g in dextrose 5 % 50 mL IVPB  Status:  Discontinued     1 g 100 mL/hr over 30 Minutes Intravenous Every 24 hours 01/31/2016 2100 02/09/16 0820   01/23/2016 2200  azithromycin (ZITHROMAX) 500 mg in dextrose 5 % 250 mL IVPB  Status:  Discontinued     500 mg 250 mL/hr over 60 Minutes Intravenous Every 24 hours 02/14/2016 2100 02/09/16 0820       Subjective: Profoundly sedated  Objective: Vitals:   02/10/16 0500 02/10/16 0600 02/10/16 0700 02/10/16 0804  BP: 107/64 112/70 101/72   Pulse: 87 95 80   Resp: _0 Temp: 98.9 F (37.2 C) 99 F (37.2  C) 99 F (37.2 C) 98 F (36.7 C)  TempSrc:   Core (Comment) Axillary  SpO2: 100% 100% 100%   Weight:  62.1 kg (136 lb 14.5 oz)    Height:        Intake/Output Summary (Last 24 hours) at 02/10/16 0908 Last data filed at 02/10/16 7408  Gross per 24 hour  Intake          2647.99 ml  Output              625 ml  Net          2022.99 ml   Filed Weights   02/08/16 0500 02/09/16 0500 02/10/16 0600  Weight: 57.6 kg (126 lb 15.8 oz) 62.1 kg (136 lb 14.5 oz) 62.1 kg (136 lb 14.5 oz)    Examination:  General exam:  Intubated, sedated Respiratory system: Clear to auscultation. Respiratory effort normal. Cardiovascular system:RRR. No murmurs, rubs, gallops. Gastrointestinal system: Abdomen is nondistended, soft and nontender. No organomegaly or masses felt. Normal bowel sounds heard. Central nervous system: Unable to fully assess as he is currently intubated, however moves all 4 spontaneously Extremities: No C/C/E, +pedal pulses Skin: No rashes, lesions or ulcers Psychiatry: Unable to assess as he is currently intubated    Data Reviewed: I have personally reviewed following labs and imaging studies  CBC:  Recent Labs Lab 02/11/2016 1523 01/17/2016 1559 02/08/16 0323 02/10/16 0530  WBC  --  7.2 4.9 6.5  NEUTROABS  --  6.0  --   --   HGB 16.7 15.7 12.8* 12.5*  HCT 49.0 48.7 39.2 39.5  MCV  --  98.4 98.5 100.5*  PLT  --  154 152 144   Basic Metabolic Panel:  Recent Labs Lab 01/31/2016 1523 01/25/2016 1559 02/08/16 0323 02/09/16 0824 02/09/16 1703 02/10/16 0530  NA 143 142 144  --   --  142  K 6.3* 4.3 4.6  --   --  4.3  CL 106 104 111  --   --  113*  CO2  --  30 24  --   --  23  GLUCOSE 120* 104* 161*  --   --  172*  BUN _0 --   --  49*  CREATININE 0.80 0.91 1.06  --   --  1.58*  CALCIUM  --  8.5* 7.5*  --   --  7.7*  MG  --   --   --  2.1 2.0 2.3  PHOS  --   --   --  3.8 3.7 3.5   GFR: Estimated Creatinine Clearance: 35.5 mL/min (by C-G formula based on SCr of 1.58 mg/dL (H)). Liver Function Tests:  Recent Labs Lab 01/26/2016 1559  AST 24  ALT 10*  ALKPHOS 87  BILITOT 0.5  PROT 6.9  ALBUMIN 3.9   No results for input(s): LIPASE, AMYLASE in the last 168 hours. No results for input(s): AMMONIA in the last 168 hours. Coagulation Profile:  Recent Labs Lab 01/20/2016 1630  INR 1.02   Cardiac Enzymes:  Recent Labs Lab 02/09/16 0827 02/09/16 1500 02/09/16 1924  TROPONINI 0.74* 0.86* 2.18*   BNP (last 3 results) No results for input(s): PROBNP in the last 8760  hours. HbA1C: No results for input(s): HGBA1C in the last 72 hours. CBG:  Recent Labs Lab 02/09/16 1559 02/09/16 1925 02/09/16 2357 02/10/16 0341 02/10/16 0731  GLUCAP 129* 136* 143* 166* 149*   Lipid Profile:  Recent Labs  02/09/16 0931  TRIG 106  Thyroid Function Tests: No results for input(s): TSH, T4TOTAL, FREET4, T3FREE, THYROIDAB in the last 72 hours. Anemia Panel: No results for input(s): VITAMINB12, FOLATE, FERRITIN, TIBC, IRON, RETICCTPCT in the last 72 hours. Urine analysis:    Component Value Date/Time   COLORURINE YELLOW 01/17/2016 1544   APPEARANCEUR HAZY (A) 01/28/2016 1544   LABSPEC 1.015 02/04/2016 1544   PHURINE 5.0 02/09/2016 1544   GLUCOSEU NEGATIVE 02/03/2016 1544   HGBUR SMALL (A) 01/25/2016 1544   BILIRUBINUR NEGATIVE 01/16/2016 1544   KETONESUR 20 (A) 01/17/2016 1544   PROTEINUR 100 (A) 01/31/2016 1544   UROBILINOGEN 0.2 04/19/2014 1946   NITRITE NEGATIVE 02/05/2016 1544   LEUKOCYTESUR NEGATIVE 01/29/2016 1544   Sepsis Labs: _0 (procalcitonin:4,lacticidven:4)  ) Recent Results (from the past 240 hour(s))  MRSA PCR Screening     Status: Abnormal   Collection Time: 02/10/2016  8:30 PM  Result Value Ref Range Status   MRSA by PCR POSITIVE (A) NEGATIVE Final    Comment:        The GeneXpert MRSA Assay (FDA approved for NASAL specimens only), is one component of a comprehensive MRSA colonization surveillance program. It is not intended to diagnose MRSA infection nor to guide or monitor treatment for MRSA infections. RESULT CALLED TO, READ BACK BY AND VERIFIED WITH: ROBERTS,T ON 02/05/2016 AT 2255 BY LOY,C   Culture, blood (Routine X 2) w Reflex to ID Panel     Status: None (Preliminary result)   Collection Time: 02/09/16  8:24 AM  Result Value Ref Range Status   Specimen Description BLOOD LEFT HAND  Final   Special Requests BOTTLES DRAWN AEROBIC ONLY 6CC  Final   Culture NO GROWTH < 24 HOURS  Final   Report Status PENDING   Incomplete  Culture, blood (Routine X 2) w Reflex to ID Panel     Status: None (Preliminary result)   Collection Time: 02/09/16  9:31 AM  Result Value Ref Range Status   Specimen Description BLOOD RIGHT HAND  Final   Special Requests BOTTLES DRAWN AEROBIC AND ANAEROBIC 6cc  Final   Culture NO GROWTH < 24 HOURS  Final   Report Status PENDING  Incomplete         Radiology Studies: Dg Chest Port 1 View  Result Date: 02/10/2016 CLINICAL DATA:  Ventilator dependent EXAM: PORTABLE CHEST 1 VIEW COMPARISON:  02/09/2016 FINDINGS: Endotracheal tube and nasogastric catheter are again seen and stable. Cardiac shadow is within normal limits. Previously seen patchy changes in the right upper lobe have resolved in the interval. No new focal infiltrate is seen. No bony abnormality is noted. IMPRESSION: No acute abnormality seen. Tubes and lines as described. Electronically Signed   By: Inez Catalina M.D.   On: 02/10/2016 09:01   Dg Chest Port 1 View  Result Date: 02/09/2016 CLINICAL DATA:  Irregular heartbeat.  Chest pain . EXAM: PORTABLE CHEST 1 VIEW COMPARISON:  02/03/2016. FINDINGS: Endotracheal tube noted with tip 3 cm above the carina. NG tube noted with tip below left hemidiaphragm. Mild infiltrate in the right upper lobe. Questionable nodular density is noted in the right apex. Small bilateral pleural effusions. Apical pleural-parenchymal thickening and cystic changes again noted on the right. These changes are consistent with scarring. No pneumothorax. Heart size stable. IMPRESSION: 1.  Endotracheal tube and NG tube in good anatomic position. 2. Questionable nodule right upper lobe. Questionable right upper lobe infiltrate. Follow-up chest x-rays recommended to demonstrate clearing. Electronically Signed   By: Marcello Moores  Register   On:  02/09/2016 08:11        Scheduled Meds: . chlorhexidine gluconate (MEDLINE KIT)  15 mL Mouth Rinse BID  . Chlorhexidine Gluconate Cloth  6 each Topical Q0600  .  enoxaparin (LOVENOX) injection  40 mg Subcutaneous Q24H  . famotidine (PEPCID) IV  20 mg Intravenous Q12H  . fentaNYL (SUBLIMAZE) injection  50 mcg Intravenous Once  . ipratropium-albuterol  3 mL Nebulization Q6H  . mouth rinse  15 mL Mouth Rinse QID  . methylPREDNISolone (SOLU-MEDROL) injection  125 mg Intravenous Q6H  . mupirocin ointment  1 application Nasal BID  . nicotine  21 mg Transdermal Daily  . piperacillin-tazobactam (ZOSYN)  IV  3.375 g Intravenous Q8H  . propofol  20 mg Intravenous Once  . propofol (DIPRIVAN) infusion  5-80 mcg/kg/min Intravenous Once  . vancomycin  500 mg Intravenous Q12H   Continuous Infusions: . sodium chloride 75 mL/hr at 02/09/16 2000  . feeding supplement (VITAL AF 1.2 CAL) 1,000 mL (02/09/16 2000)  . fentaNYL infusion INTRAVENOUS 150 mcg/hr (02/10/16 0658)  . propofol (DIPRIVAN) infusion 15 mcg/kg/min (02/10/16 0000)     LOS: 3 days    Critical care time spent: 45 minutes. Greater than 50% of this time was spent in direct contact with the patient coordinating care.     Lelon Frohlich, MD [facility] Pager [phone]  If 7PM-7AM, please contact night-coverage [email or web address] Password [email or web address] 02/10/2016, 9:08 AM

## 2016-02-10 NOTE — Progress Notes (Signed)
Daily Progress Note   Patient Name: [name]       Date: 02/10/2016 DOB: 1940/02/21  Age: 76 y.o. MRN#: [id number] Attending Physician: [name]* Primary Care Physician: Jeremy Blackbird, MD Admit Date: 02/02/2016  Reason for Consultation/Follow-up: Disposition, Establishing goals of care and Psychosocial/spiritual support  Subjective: [name] is resting quietly in bed. He is intubated/ventilated and sedated and does not attempt to respond in any meaningful manner. There is no family at bedside at this time. Call to wife [name] at [phone], unable to leave voicemail.  Call to home phone at [phone]. Spoke with [name] (states he has lived with [name] all his life). He shares that "[name]" has stated almost daily, "I don't want to die".  He shares his concerns over [name] current health condition, stating, "I can't believe he's lived this long".  He also shares that when [place] left their home he worried that he would never return.  We talk about [name] current health concerns, his inability to wean at this point. I share my concern about quality of life for [name] after extend ICU stay. Talk about the possibility of rehab in the future. Then he shares that GM's children from his 1st marriage, [name] and [name], want their father transferred to [facility]. I share with [name] that [name], as legal wife, makes decisions for [name]. Then he talks about the work that needs to be done in the home if [name] is able to return. 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 [name] and [name] both worked in the [employer].  I encourage [name] to share the importance of a family meeting on Monday. That any family members who are interested are welcome to  come. I ask that [name] and the family pick a time and let nursing staff know.   Length of Stay: 3  Current Medications: Scheduled Meds:  . chlorhexidine gluconate (MEDLINE KIT)  15 mL Mouth Rinse BID  . Chlorhexidine Gluconate Cloth  6 each Topical Q0600  . enoxaparin (LOVENOX) injection  40 mg Subcutaneous Q24H  . famotidine (PEPCID) IV  20 mg Intravenous Q12H  . fentaNYL (SUBLIMAZE) injection  50 mcg Intravenous Once  . ipratropium-albuterol  3 mL Nebulization Q6H  . mouth rinse  15 mL Mouth Rinse QID  .  methylPREDNISolone (SOLU-MEDROL) injection  125 mg Intravenous Q6H  . mupirocin ointment  1 application Nasal BID  . nicotine  21 mg Transdermal Daily  . piperacillin-tazobactam (ZOSYN)  IV  3.375 g Intravenous Q8H  . propofol  20 mg Intravenous Once  . propofol (DIPRIVAN) infusion  5-80 mcg/kg/min Intravenous Once  . vancomycin  1,000 mg Intravenous Q24H    Continuous Infusions: . sodium chloride 75 mL/hr at 02/09/16 2000  . feeding supplement (VITAL AF 1.2 CAL) 1,000 mL (02/10/16 0954)  . fentaNYL infusion INTRAVENOUS 150 mcg/hr (02/10/16 0658)  . propofol (DIPRIVAN) infusion 15 mcg/kg/min (02/10/16 0914)    PRN Meds: acetaminophen, albuterol, bisacodyl, fentaNYL, sodium chloride  Physical Exam  Constitutional: No distress.  Intubated/ventilated, sedated. Acutely/chronically ill appearing.  HENT:  Head: Normocephalic and atraumatic.  Cardiovascular:  Irregular, rate in the 80s  Pulmonary/Chest:  Intubated/ventilated. No weaning recommended today.  Abdominal:  Abdomen somewhat firm, some tympany  Musculoskeletal: He exhibits no edema.  Neurological:  Intubated/sedated  Skin: Skin is warm and dry.            Vital Signs: BP 101/72 (BP Location: Right Arm)   Pulse 80   Temp 98.9 F (37.2 C) (Core (Comment)) Comment (Src): Temp Foley  Resp 20   Ht '5\' 8"'$  (1.727 m)   Wt 62.1 kg (136 lb 14.5 oz)   SpO2 100%   BMI 20.82 kg/m  SpO2: SpO2: 100 % O2 Device: O2  Device: Ventilator O2 Flow Rate: O2 Flow Rate (L/min): 12 L/min  Intake/output summary:  Intake/Output Summary (Last 24 hours) at 02/10/16 1238 Last data filed at 02/10/16 0272  Gross per 24 hour  Intake          1731.56 ml  Output              550 ml  Net          1181.56 ml   LBM: Last BM Date:  (PTA, UTA, >3days) Baseline Weight: Weight: 59.9 kg (132 lb) Most recent weight: Weight: 62.1 kg (136 lb 14.5 oz)       Palliative Assessment/Data:    Flowsheet Rows   Flowsheet Row Most Recent Value  Intake Tab  Referral Department  Hospitalist  Unit at Time of Referral  ICU  Palliative Care Primary Diagnosis  Pulmonary  Date Notified  02/09/16  Palliative Care Type  New Palliative care  Reason for referral  Clarify Goals of Care  Date of Admission  01/18/2016  Date first seen by Palliative Care  02/09/16  # of days Palliative referral response time  0 Day(s)  # of days IP prior to Palliative referral  2  Clinical Assessment  Palliative Performance Scale Score  10%  Pain Max last 24 hours  Not able to report  Pain Min Last 24 hours  Not able to report  Dyspnea Max Last 24 Hours  Not able to report  Dyspnea Min Last 24 hours  Not able to report  Psychosocial & Spiritual Assessment  Palliative Care Outcomes  Patient/Family meeting held?  Yes  Who was at the meeting?  Wife, [name]  Palliative Care Outcomes  Provided psychosocial or spiritual support, Clarified goals of care  Palliative Care follow-up planned  -- [Follow-up while at [place]      Patient Active Problem List   Diagnosis Date Noted  . Palliative care encounter   . Goals of care, counseling/discussion   . DNR (do not resuscitate) discussion   . Respiratory failure ([place])  02/04/2016  . Acute on chronic respiratory failure with hypercapnia ([place]) 02/15/2016  . Respiratory disorder with ventilator dependence ([place]) 02/08/2016  . Fever 02/15/2016  . Acute bronchitis 02/10/2016  . 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 Tremor 07/17/2015  . GERD (gastroesophageal reflux disease) 03/21/2015  . Generalized weakness 04/20/2014  . Gait instability 04/20/2014  . Acute sinusitis 04/20/2014  . COPD exacerbation ([place]) 03/31/2014  . Chronic respiratory failure with hypoxia ([place]) 08/19/2013  . AK (actinic keratosis) 07/29/2013  . Atypical chest pain 07/29/2013  . Decreased hearing 11/03/2012  . Leg cramps 07/04/2012  . OA (osteoarthritis) 04/25/2012  . Impaired mobility 04/25/2012  . Insomnia 03/29/2012  . Tobacco abuse 04/05/2011  . COPD (chronic obstructive pulmonary disease) ([place]) 03/14/2011  . Essential hypertension, benign 03/14/2011  . Hyperlipidemia 03/14/2011  . PVD (peripheral vascular disease) ([place]) 03/14/2011  . Depression with anxiety 03/14/2011  . Joint pain 03/14/2011  . Skin cancer 03/14/2011    Palliative Care Assessment & Plan   Patient Profile: 76 y.o. male  with past medical history of COPD per wife intubated/ventilated 5 times, hypertension and high cholesterol, PVD, anxiety, arthritis, skin cancer admitted on 01/28/2016 with acute on chronic ventilator dependent respiratory failure due to COPD exacerbation.   Assessment: Acute on chronic hypoxemic respiratory failure, ventilatory dependent; unable to wean at this point, continue ventilator support. Family meeting requested by PMT for Monday. Functional decline; wife states that Mr. Dionne Milo is dependent on her for help with bathing. SNF for rehab would be appropriate if he qualifies.   Recommendations/Plan:  Continue to treat the treatable, continue trials of ventilator weaning as appropriate, and continue code status discussions. Recommended family meeting Monday 1/29, discussed with son [name].  Goals of Care and Additional Recommendations:  Limitations on Scope of Treatment: Full Scope Treatment  Code Status:    Code Status Orders        Start     Ordered   02/08/2016 2101  Full code  Continuous      02/09/2016 2100    Code Status History    Date Active Date Inactive Code Status Order ID Comments User Context   08/18/2013  3:55 PM 08/20/2013  7:03 PM Full Code [id number]  Kinnie Feil, MD Inpatient   04/05/2011  3:46 AM 04/07/2011  7:59 PM Full Code [id number]  Ardath Sax, RN Inpatient       Prognosis:   < 6 months would not be surprising based on functional decline, repeat intubations/COPD exacerbations, decreased functional status prior to this hospitalization. Discharge Planning:  To Be Determined, SNF for rehab would definitely be helpful.  Care plan was discussed with nursing staff, case manager, social worker, and Dr. Jerilee Hoh on next rounds.  Thank you for allowing the Palliative Medicine Team to assist in the care of this patient.   Time In: 0930  Time Out: 1010  Total Time 40 minutes  Prolonged Time Billed  no       Greater than 50%  of this time was spent counseling and coordinating care related to the above assessment and plan.  Drue Novel, NP  Please contact Palliative Medicine Team phone at [phone] for questions and concerns.

## 2016-02-10 NOTE — Progress Notes (Addendum)
Subjective: He remains intubated and on the ventilator. He is sedated. No new problems noted. His urine output is low.  Objective: Vital signs in last 24 hours: Temp:  [98.8 F (37.1 C)-101.1 F (38.4 C)] 99 F (37.2 C) (01/26 0600) Pulse Rate:  [75-125] 95 (01/26 0600) Resp:  [12-20] 20 (01/26 0600) BP: (95-152)/(62-92) 112/70 (01/26 0600) SpO2:  [91 %-100 %] 100 % (01/26 0600) FiO2 (%):  [50 %-70 %] 50 % (01/26 0411) Weight:  [62.1 kg (136 lb 14.5 oz)] 62.1 kg (136 lb 14.5 oz) (01/26 0600) Weight change: 0 kg (0 lb) Last BM Date:  (PTA, UTA, >3days)  Intake/Output from previous day: 01/25 0701 - 01/26 0700 In: 2708 [I.V.:1287.7; NG/GT:770.3; IV Piggyback:650] Out: 625 [Urine:625]  PHYSICAL EXAM General appearance: Intubated sedated on ventilator Resp: rhonchi bilaterally Cardio: regular rate and rhythm, S1, S2 normal, no murmur, click, rub or gallop GI: soft, non-tender; bowel sounds normal; no masses,  no organomegaly Extremities: He has some puffiness of the skin Skin puffy. Warm and dry. Mucous membranes are moist  Lab Results:  Results for orders placed or performed during the hospital encounter of 01/24/2016 (from the past 48 hour(s))  Blood gas, arterial     Status: Abnormal   Collection Time: 02/08/16 10:15 AM  Result Value Ref Range   FIO2 35.00    Delivery systems VENTILATOR    Mode CONTINUOUS POSITIVE AIRWAY PRESSURE    Peep/cpap 5.0 cm H20   Pressure support 10.0 cm H20   pH, Arterial 7.182 (LL) 7.350 - 7.450    Comment: CRITICAL RESULT CALLED TO, READ BACK BY AND VERIFIED WITH: LYLE FREEMAN,RN ON 02/08/16 BY JENNIFER ROWLAND,RRT AT 1029.    pCO2 arterial 71.1 (HH) 32.0 - 48.0 mmHg    Comment: CRITICAL RESULT CALLED TO, READ BACK BY AND VERIFIED WITH: LYLE FREEMAN,RN ON 02/08/16 BY JENNIFER ROWLAND,RRT AT 1029.    pO2, Arterial 90.6 83.0 - 108.0 mmHg   Bicarbonate 21.1 20.0 - 28.0 mmol/L   Acid-base deficit 1.8 0.0 - 2.0 mmol/L   O2 Saturation 95.3 %   Patient temperature 37.0    Collection site RIGHT RADIAL    Drawn by [id number]    Sample type ARTERIAL DRAW    Allens test (pass/fail) PASS PASS  Influenza panel by PCR (type A & B)     Status: None   Collection Time: 02/08/16  4:52 PM  Result Value Ref Range   Influenza A By PCR NEGATIVE NEGATIVE   Influenza B By PCR NEGATIVE NEGATIVE    Comment: (NOTE) The Xpert Xpress Flu assay is intended as an aid in the diagnosis of  influenza and should not be used as a sole basis for treatment.  This  assay is FDA approved for nasopharyngeal swab specimens only. Nasal  washings and aspirates are unacceptable for Xpert Xpress Flu testing.   Blood gas, arterial     Status: Abnormal   Collection Time: 02/09/16  4:10 AM  Result Value Ref Range   FIO2 35.00    Delivery systems VENTILATOR    Mode PRESSURE REGULATED VOLUME CONTROL    VT 450 mL   LHR 18 resp/min   Peep/cpap 5.0 cm H20   pH, Arterial 7.220 (L) 7.350 - 7.450   pCO2 arterial 60.4 (H) 32.0 - 48.0 mmHg   pO2, Arterial 112.0 (H) 83.0 - 108.0 mmHg   Bicarbonate 20.9 20.0 - 28.0 mmol/L   Acid-base deficit 3.0 (H) 0.0 - 2.0 mmol/L   O2 Saturation 97.2 %  Patient temperature 37.8    Collection site RIGHT RADIAL    Drawn by [id number]    Sample type ARTERIAL    Allens test (pass/fail) PASS PASS  Blood gas, arterial     Status: Abnormal   Collection Time: 02/09/16  6:00 AM  Result Value Ref Range   FIO2 30.00    Delivery systems VENTILATOR    Mode PRESSURE REGULATED VOLUME CONTROL    VT 500 mL   LHR 18.0 resp/min   Peep/cpap 5.0 cm H20   pH, Arterial 7.226 (L) 7.350 - 7.450   pCO2 arterial 60.4 (H) 32.0 - 48.0 mmHg   pO2, Arterial 88.8 83.0 - 108.0 mmHg   Bicarbonate 20.7 20.0 - 28.0 mmol/L   Acid-base deficit 2.6 (H) 0.0 - 2.0 mmol/L   O2 Saturation 95.4 %   Patient temperature 37.8    Collection site RIGHT RADIAL    Drawn by [id number]    Sample type ARTERIAL    Allens test (pass/fail) PASS PASS  Glucose, capillary     Status:  Abnormal   Collection Time: 02/09/16  8:13 AM  Result Value Ref Range   Glucose-Capillary 118 (H) 65 - 99 mg/dL  Magnesium     Status: None   Collection Time: 02/09/16  8:24 AM  Result Value Ref Range   Magnesium 2.1 1.7 - 2.4 mg/dL  Phosphorus     Status: None   Collection Time: 02/09/16  8:24 AM  Result Value Ref Range   Phosphorus 3.8 2.5 - 4.6 mg/dL  Culture, blood (Routine X 2) w Reflex to ID Panel     Status: None (Preliminary result)   Collection Time: 02/09/16  8:24 AM  Result Value Ref Range   Specimen Description BLOOD LEFT HAND    Special Requests BOTTLES DRAWN AEROBIC ONLY 6CC    Culture PENDING    Report Status PENDING   Troponin I (q 6hr x 3)     Status: Abnormal   Collection Time: 02/09/16  8:27 AM  Result Value Ref Range   Troponin I 0.74 (HH) <0.03 ng/mL    Comment: CRITICAL RESULT CALLED TO, READ BACK BY AND VERIFIED WITH: MOTLEY,J AT 9:10AM ON 02/09/16 BY FESTERMAN,C   Culture, blood (Routine X 2) w Reflex to ID Panel     Status: None (Preliminary result)   Collection Time: 02/09/16  9:31 AM  Result Value Ref Range   Specimen Description BLOOD RIGHT HAND    Special Requests BOTTLES DRAWN AEROBIC AND ANAEROBIC 6cc    Culture PENDING    Report Status PENDING   Triglycerides     Status: None   Collection Time: 02/09/16  9:31 AM  Result Value Ref Range   Triglycerides 106 <150 mg/dL    Comment: RESULTS CONFIRMED BY MANUAL DILUTION  Glucose, capillary     Status: Abnormal   Collection Time: 02/09/16 10:51 AM  Result Value Ref Range   Glucose-Capillary 110 (H) 65 - 99 mg/dL  Blood gas, arterial     Status: Abnormal   Collection Time: 02/09/16  2:40 PM  Result Value Ref Range   FIO2 0.70    Delivery systems VENTILATOR    Mode PRESSURE REGULATED VOLUME CONTROL    VT 500 mL   LHR 16 resp/min   Peep/cpap 5.0 cm H20   pH, Arterial 7.197 (LL) 7.350 - 7.450    Comment: CRITICAL RESULT CALLED TO, READ BACK BY AND VERIFIED WITH: Gretta Cool AT 1444 BY WENDY  VIA,RRT,RCP ON 02/09/2016  pCO2 arterial 57.2 (H) 32.0 - 48.0 mmHg   pO2, Arterial 269 (H) 83.0 - 108.0 mmHg   Bicarbonate 18.8 (L) 20.0 - 28.0 mmol/L   Acid-base deficit 5.5 (H) 0.0 - 2.0 mmol/L   O2 Saturation 99.2 %   Patient temperature 99.2    Collection site LEFT RADIAL    Drawn by [id number]    Sample type ARTERIAL DRAW    Allens test (pass/fail) PASS PASS  Troponin I (q 6hr x 3)     Status: Abnormal   Collection Time: 02/09/16  3:00 PM  Result Value Ref Range   Troponin I 0.86 (HH) <0.03 ng/mL    Comment: CRITICAL RESULT CALLED TO, READ BACK BY AND VERIFIED WITH: WILLIE,E AT 1550 ON 02/09/2016 BY ISLEY,B   Glucose, capillary     Status: Abnormal   Collection Time: 02/09/16  3:59 PM  Result Value Ref Range   Glucose-Capillary 129 (H) 65 - 99 mg/dL  Magnesium     Status: None   Collection Time: 02/09/16  5:03 PM  Result Value Ref Range   Magnesium 2.0 1.7 - 2.4 mg/dL  Phosphorus     Status: None   Collection Time: 02/09/16  5:03 PM  Result Value Ref Range   Phosphorus 3.7 2.5 - 4.6 mg/dL  Blood gas, arterial     Status: Abnormal   Collection Time: 02/09/16  5:05 PM  Result Value Ref Range   FIO2 50.00    Delivery systems VENTILATOR    Mode PRESSURE REGULATED VOLUME CONTROL    VT 500 mL   LHR 20 resp/min   Peep/cpap 5.0 cm H20   pH, Arterial 7.227 (L) 7.350 - 7.450   pCO2 arterial 52.2 (H) 32.0 - 48.0 mmHg   pO2, Arterial 122.0 (H) 83.0 - 108.0 mmHg   Bicarbonate 19.1 (L) 20.0 - 28.0 mmol/L   Acid-base deficit 5.5 (H) 0.0 - 2.0 mmol/L   O2 Saturation 97.9 %   Patient temperature 37.0    Collection site LEFT RADIAL    Drawn by [id number]    Sample type ARTERIAL    Allens test (pass/fail) PASS PASS  Troponin I (q 6hr x 3)     Status: Abnormal   Collection Time: 02/09/16  7:24 PM  Result Value Ref Range   Troponin I 2.18 (HH) <0.03 ng/mL    Comment: DELTA CHECK NOTED  Glucose, capillary     Status: Abnormal   Collection Time: 02/09/16  7:25 PM  Result Value Ref  Range   Glucose-Capillary 136 (H) 65 - 99 mg/dL   Comment 1 Notify RN   Glucose, capillary     Status: Abnormal   Collection Time: 02/09/16 11:57 PM  Result Value Ref Range   Glucose-Capillary 143 (H) 65 - 99 mg/dL  Glucose, capillary     Status: Abnormal   Collection Time: 02/10/16  3:41 AM  Result Value Ref Range   Glucose-Capillary 166 (H) 65 - 99 mg/dL   Comment 1 Notify RN   Blood gas, arterial     Status: Abnormal   Collection Time: 02/10/16  5:15 AM  Result Value Ref Range   FIO2 50.00    Delivery systems VENTILATOR    Mode PRESSURE REGULATED VOLUME CONTROL    VT 500 mL   LHR 20 resp/min   Peep/cpap 5.0 cm H20   pH, Arterial 7.225 (L) 7.350 - 7.450   pCO2 arterial 55.8 (H) 32.0 - 48.0 mmHg   pO2, Arterial 199.0 (H) 83.0 - 108.0 mmHg  Bicarbonate 19.7 (L) 20.0 - 28.0 mmol/L   Acid-base deficit 4.3 (H) 0.0 - 2.0 mmol/L   O2 Saturation 98.9 %   Patient temperature 37.0    Collection site LEFT RADIAL    Drawn by [id number]    Sample type ARTERIAL    Allens test (pass/fail) PASS PASS  Magnesium     Status: None   Collection Time: 02/10/16  5:30 AM  Result Value Ref Range   Magnesium 2.3 1.7 - 2.4 mg/dL  Phosphorus     Status: None   Collection Time: 02/10/16  5:30 AM  Result Value Ref Range   Phosphorus 3.5 2.5 - 4.6 mg/dL  Basic metabolic panel     Status: Abnormal   Collection Time: 02/10/16  5:30 AM  Result Value Ref Range   Sodium 142 135 - 145 mmol/L   Potassium 4.3 3.5 - 5.1 mmol/L   Chloride 113 (H) 101 - 111 mmol/L   CO2 23 22 - 32 mmol/L   Glucose, Bld 172 (H) 65 - 99 mg/dL   BUN 49 (H) 6 - 20 mg/dL   Creatinine, Ser 1.58 (H) 0.61 - 1.24 mg/dL   Calcium 7.7 (L) 8.9 - 10.3 mg/dL   GFR calc non Af Amer 41 (L) >60 mL/min   GFR calc Af Amer 48 (L) >60 mL/min    Comment: (NOTE) The eGFR has been calculated using the CKD EPI equation. This calculation has not been validated in all clinical situations. eGFR's persistently <60 mL/min signify possible Chronic  Kidney Disease.    Anion gap 6 5 - 15  CBC     Status: Abnormal   Collection Time: 02/10/16  5:30 AM  Result Value Ref Range   WBC 6.5 4.0 - 10.5 K/uL   RBC 3.93 (L) 4.22 - 5.81 MIL/uL   Hemoglobin 12.5 (L) 13.0 - 17.0 g/dL   HCT 39.5 39.0 - 52.0 %   MCV 100.5 (H) 78.0 - 100.0 fL   MCH 31.8 26.0 - 34.0 pg   MCHC 31.6 30.0 - 36.0 g/dL   RDW 15.3 11.5 - 15.5 %   Platelets 182 150 - 400 K/uL    ABGS  Recent Labs  01/20/2016 1523  02/10/16 0515  PHART  --   < > 7.225*  PO2ART  --   < > 199.0*  TCO2 33  --   --   HCO3  --   < > 19.7*  < > = values in this interval not displayed. CULTURES Recent Results (from the past 240 hour(s))  MRSA PCR Screening     Status: Abnormal   Collection Time: 02/15/2016  8:30 PM  Result Value Ref Range Status   MRSA by PCR POSITIVE (A) NEGATIVE Final    Comment:        The GeneXpert MRSA Assay (FDA approved for NASAL specimens only), is one component of a comprehensive MRSA colonization surveillance program. It is not intended to diagnose MRSA infection nor to guide or monitor treatment for MRSA infections. RESULT CALLED TO, READ BACK BY AND VERIFIED WITH: ROBERTS,T ON 02/09/2016 AT 2255 BY LOY,C   Culture, blood (Routine X 2) w Reflex to ID Panel     Status: None (Preliminary result)   Collection Time: 02/09/16  8:24 AM  Result Value Ref Range Status   Specimen Description BLOOD LEFT HAND  Final   Special Requests BOTTLES DRAWN AEROBIC ONLY [facility]  Final   Culture PENDING  Incomplete   Report Status PENDING  Incomplete  Culture, blood (Routine X 2) w Reflex to ID Panel     Status: None (Preliminary result)   Collection Time: 02/09/16  9:31 AM  Result Value Ref Range Status   Specimen Description BLOOD RIGHT HAND  Final   Special Requests BOTTLES DRAWN AEROBIC AND ANAEROBIC 6cc  Final   Culture PENDING  Incomplete   Report Status PENDING  Incomplete   Studies/Results: Dg Chest Port 1 View  Result Date: 02/09/2016 CLINICAL DATA:   Irregular heartbeat.  Chest pain . EXAM: PORTABLE CHEST 1 VIEW COMPARISON:  02/11/2016. FINDINGS: Endotracheal tube noted with tip 3 cm above the carina. NG tube noted with tip below left hemidiaphragm. Mild infiltrate in the right upper lobe. Questionable nodular density is noted in the right apex. Small bilateral pleural effusions. Apical pleural-parenchymal thickening and cystic changes again noted on the right. These changes are consistent with scarring. No pneumothorax. Heart size stable. IMPRESSION: 1.  Endotracheal tube and NG tube in good anatomic position. 2. Questionable nodule right upper lobe. Questionable right upper lobe infiltrate. Follow-up chest x-rays recommended to demonstrate clearing. Electronically Signed   By: Marcello Moores  Register   On: 02/09/2016 08:11    Medications:  Prior to Admission:  Prescriptions Prior to Admission  Medication Sig Dispense Refill Last Dose  . albuterol (PROVENTIL HFA;VENTOLIN HFA) 108 (90 Base) MCG/ACT inhaler Inhale 1-2 puffs into the lungs every 6 (six) hours as needed for wheezing or shortness of breath. 1 Inhaler 0 Past Month at Unknown time  . ALPRAZolam (XANAX) 1 MG tablet Take 1 tablet (1 mg total) by mouth 3 (three) times daily as needed for anxiety. 90 tablet 1 Past Month at Unknown time  . budesonide-formoterol (SYMBICORT) 160-4.5 MCG/ACT inhaler Inhale 2 puffs into the lungs 2 (two) times daily.   Past Month at Unknown time  . FLUoxetine (PROZAC) 40 MG capsule Take 1 capsule (40 mg total) by mouth daily. 30 capsule 3 Past Month at Unknown time  . Ivermectin 0.5 % LOTN Apply thoroughly to dry hair and scalp; leave lotion on hair for 10 minutes, and then rinse with water. 1 Tube 1 Past Month at Unknown time  . loratadine (CLARITIN) 10 MG tablet Take 1 tablet (10 mg total) by mouth daily. 30 tablet 11 Past Month at Unknown time  . omeprazole (PRILOSEC) 20 MG capsule take 1 capsule by mouth once daily 30 capsule 3 Past Month at Unknown time  .  propranolol (INDERAL) 10 MG tablet take 1 tablet by mouth twice a day for TREMOR 60 tablet 3 Past Month at Unknown time  . rosuvastatin (CRESTOR) 10 MG tablet take 1 tablet by mouth once daily 30 tablet 3 Past Month at Unknown time  . tiotropium (SPIRIVA) 18 MCG inhalation capsule Place 18 mcg into inhaler and inhale daily.   Past Month at Unknown time  . albuterol (PROVENTIL) (2.5 MG/3ML) 0.083% nebulizer solution Take 3 mLs (2.5 mg total) by nebulization every 6 (six) hours as needed for wheezing or shortness of breath. 150 mL 1   . amLODipine (NORVASC) 10 MG tablet Take 10 mg by mouth daily.   10/27/2015 at Unknown time  . ibuprofen (ADVIL,MOTRIN) 200 MG tablet Take 200 mg by mouth every 6 (six) hours as needed for headache, mild pain or moderate pain. For pain    10/26/2015 at 1700  . nitroGLYCERIN (NITROSTAT) 0.4 MG SL tablet Place 0.4 mg under the tongue every 5 (five) minutes as needed for chest pain.   Past Month at Unknown  time  . vitamin B-12 (CYANOCOBALAMIN) 1000 MCG tablet Take 1,000 mcg by mouth daily.   10/27/2015 at Unknown time  . [DISCONTINUED] predniSONE (DELTASONE) 20 MG tablet 2 tabs po daily x 4 days 8 tablet 0    Scheduled: . chlorhexidine gluconate (MEDLINE KIT)  15 mL Mouth Rinse BID  . Chlorhexidine Gluconate Cloth  6 each Topical Q0600  . enoxaparin (LOVENOX) injection  40 mg Subcutaneous Q24H  . famotidine (PEPCID) IV  20 mg Intravenous Q12H  . fentaNYL (SUBLIMAZE) injection  50 mcg Intravenous Once  . ipratropium-albuterol  3 mL Nebulization Q6H  . mouth rinse  15 mL Mouth Rinse QID  . methylPREDNISolone (SOLU-MEDROL) injection  125 mg Intravenous Q6H  . mupirocin ointment  1 application Nasal BID  . nicotine  21 mg Transdermal Daily  . piperacillin-tazobactam (ZOSYN)  IV  3.375 g Intravenous Q8H  . propofol  20 mg Intravenous Once  . propofol (DIPRIVAN) infusion  5-80 mcg/kg/min Intravenous Once  . vancomycin  500 mg Intravenous Q12H   Continuous: . sodium  chloride 75 mL/hr at 02/09/16 2000  . feeding supplement (VITAL AF 1.2 CAL) 1,000 mL (02/09/16 2000)  . fentaNYL infusion INTRAVENOUS 150 mcg/hr (02/10/16 0658)  . propofol (DIPRIVAN) infusion 15 mcg/kg/min (02/10/16 0000)   DOD:[id number], albuterol, bisacodyl, fentaNYL, sodium chloride  Assesment:He was admitted with COPD exacerbation and acute on chronic hypoxic and hypercapnic respiratory failure. He has been on mechanical ventilation essentially since he came to the hospital. He has hypertension at baseline. His urine output is somewhat low. Kidney function is not as good as previously. His troponin level has now gone up to 2.18. We have not been able to get his pH up or his PCO2 down. This morning he is still at 7.22 and 55.8 respectively. Chest x-ray yesterday had some suggestion of a right upper lobe infiltrate although I don't think it's totally clear on personal review. He looks clearer today on personal review. He is on treatment for pneumonia. Principal Problem:   COPD exacerbation ([place]) Active Problems:   COPD (chronic obstructive pulmonary disease) (HCC)   Essential hypertension, benign   Respiratory failure (HCC)   Acute on chronic respiratory failure with hypercapnia (HCC)   Respiratory disorder with ventilator dependence ([place])   Fever   Acute bronchitis   Palliative care encounter   Goals of care, counseling/discussion   DNR (do not resuscitate) discussion    Plan: Continue treatments. Adjust ventilator. No opportunity for weaning today. Echocardiogram to be done    LOS: 3 days   Aslin Farinas L 02/10/2016, 7:24 AM

## 2016-02-10 NOTE — Progress Notes (Signed)
Nutrition Follow-up  INTERVENTION:  Decrease rate due to incr propofol- Vital 1.2 @ 50 ml/hr (1200 ml daily) via OGT  Tube feeding regimen provides 1440  Kcal, 90 grams of protein, and 973 ml of H2O.    Sedative adds 198 kcal daily- lipids  IVF's-NS @ 75 ml/hr per nursing.    NUTRITION DIAGNOSIS:   Inadequate oral intake related to inability to eat as evidenced by NPO status.   GOAL:   Provide needs based on ASPEN/SCCM guidelines   MONITOR:   TF tolerance, Labs, Vent status, Weight trends  REASON FOR ASSESSMENT: consult to adjust/manage tube feeds       ASSESSMENT:   Patient presents with dyspnea and a fever. He has hx of tobacco abuse, COPD, HTN. Chart reviewed -no family present. His weight hx is stable over the past 1.5 years.  Patient remains intubated on ventilator support due to acute respiratory failure.   He is tolerating tube feeds via OGT. His sedative was increased -will decrease tube feed to prevent overfeeding.   MV: 10 L/min Temp (24hrs), Avg:99.2 F (37.3 C), Min:98 F (36.7 C), Max:100.8 F (38.2 C)  Propofol: 7.5 ml/hr (provides 198 kcal lipids every 24 hr at current rate)    Recent Labs Lab 02/14/2016 1559 02/08/16 0323 02/09/16 0824 02/09/16 1703 02/10/16 0530  NA 142 144  --   --  142  K 4.3 4.6  --   --  4.3  CL 104 111  --   --  113*  CO2 30 24  --   --  23  BUN 9 15  --   --  49*  CREATININE 0.91 1.06  --   --  1.58*  CALCIUM 8.5* 7.5*  --   --  7.7*  MG  --   --  2.1 2.0 2.3  PHOS  --   --  3.8 3.7 3.5  GLUCOSE 104* 161*  --   --  172*   Labs/meds: BUN and Cr trending up-pt on IVF-NS @ 75 ml/hr. His glucose is also increased. Mag and phos -WNL.  Diet Order:  Diet NPO time specified  Skin:  Reviewed, no issues  Last BM:  prior to admission  Height:   Ht Readings from Last 1 Encounters:  01/29/2016 5\' 8"  (1.727 m)    Weight:   Wt Readings from Last 1 Encounters:  02/10/16 136 lb 14.5 oz (62.1 kg)    Ideal Body  Weight:  70 kg  BMI:  Body mass index is 20.82 kg/m.  Re- assessed estimated Nutritional Needs:   Kcal:  1678  Protein:  93-111 gr  Fluid:  1.6 liters daily  EDUCATION NEEDS:   No education needs identified at this time  Colman Cater MS,RD,CSG,LDN Office: [phone] Pager: [phone]

## 2016-02-10 NOTE — Progress Notes (Signed)
Pharmacy Antibiotic Note  [name] is a 76 y.o. male admitted on 02/08/2016 with sepsis.  Pharmacy has been consulted for Vancomycin and zosyn dosing.  SCr rising, pt on vent in ICU  Plan:  Modify Vancomycin 1000mg  IV q24h Check trough at steady state Zosyn 3.375gm IV q8h, EID Monitor labs, renal fxn, progress and c/s Deescalate ABX when improved / appropriate.    Height: 5\' 8"  (172.7 cm) Weight: 136 lb 14.5 oz (62.1 kg) IBW/kg (Calculated) : 68.4  Temp (24hrs), Avg:99 F (37.2 C), Min:98 F (36.7 C), Max:99.2 F (37.3 C)   Recent Labs Lab 01/29/2016 1523 02/14/2016 1559 02/08/16 0323 02/10/16 0530  WBC  --  7.2 4.9 6.5  CREATININE 0.80 0.91 1.06 1.58*  LATICACIDVEN 0.70  --  1.6  --     Estimated Creatinine Clearance: 35.5 mL/min (by C-G formula based on SCr of 1.58 mg/dL (H)).    No Known Allergies  Antimicrobials this admission: Ceftriaxone 1/23 >> 1/25 Azithromycin 1/23 >> 1/25 Vancomycin 1/25>> Zosyn 1/25 >>  Dose adjustments this admission: N/a  Microbiology results: 1/25 BCx: pending 1/23 MRSA PCR: positive  Thank you for allowing pharmacy to be a part of this patient's care.  Hart Robinsons, PharmD Clinical Pharmacist Pager:  [phone] 02/10/2016   02/10/2016 12:20 PM

## 2016-02-11 ENCOUNTER — Inpatient Hospital Stay (HOSPITAL_COMMUNITY): Payer: Medicare HMO

## 2016-02-11 LAB — BLOOD GAS, ARTERIAL
ACID-BASE DEFICIT: 4.5 mmol/L — AB (ref 0.0–2.0)
ACID-BASE EXCESS: 4.6 mmol/L — AB (ref 0.0–2.0)
BICARBONATE: 19 mmol/L — AB (ref 20.0–28.0)
BICARBONATE: 19.5 mmol/L — AB (ref 20.0–28.0)
Drawn by: 317771
Drawn by: 234301
FIO2: 0.45
FIO2: 0.4
LHR: 22 {breaths}/min
LHR: 18 {breaths}/min
MECHVT: 550 mL
O2 Content: 45 L/min
O2 Content: 40 L/min
O2 SAT: 98.7 %
O2 SAT: 98.4 %
PCO2 ART: 64.7 mmHg — AB (ref 32.0–48.0)
PEEP/CPAP: 5 cmH2O
PEEP/CPAP: 5 cmH2O
PH ART: 7.172 — AB (ref 7.350–7.450)
PH ART: 7.198 — AB (ref 7.350–7.450)
PO2 ART: 191 mmHg — AB (ref 83.0–108.0)
VT: 500 mL
pCO2 arterial: 60 mmHg — ABNORMAL HIGH (ref 32.0–48.0)
pO2, Arterial: 141 mmHg — ABNORMAL HIGH (ref 83.0–108.0)

## 2016-02-11 LAB — CBC
HCT: 44.4 % (ref 39.0–52.0)
HEMOGLOBIN: 13.6 g/dL (ref 13.0–17.0)
MCH: 31 pg (ref 26.0–34.0)
MCHC: 30.6 g/dL (ref 30.0–36.0)
MCV: 101.1 fL — ABNORMAL HIGH (ref 78.0–100.0)
Platelets: 65 10*3/uL — ABNORMAL LOW (ref 150–400)
RBC: 4.39 MIL/uL (ref 4.22–5.81)
RDW: 15.7 % — AB (ref 11.5–15.5)
WBC: 7.1 10*3/uL (ref 4.0–10.5)

## 2016-02-11 LAB — GLUCOSE, CAPILLARY
GLUCOSE-CAPILLARY: 142 mg/dL — AB (ref 65–99)
GLUCOSE-CAPILLARY: 120 mg/dL — AB (ref 65–99)
Glucose-Capillary: 120 mg/dL — ABNORMAL HIGH (ref 65–99)
Glucose-Capillary: 128 mg/dL — ABNORMAL HIGH (ref 65–99)
Glucose-Capillary: 132 mg/dL — ABNORMAL HIGH (ref 65–99)
Glucose-Capillary: 145 mg/dL — ABNORMAL HIGH (ref 65–99)

## 2016-02-11 LAB — BASIC METABOLIC PANEL
ANION GAP: 7 (ref 5–15)
BUN: 56 mg/dL — AB (ref 6–20)
CALCIUM: 7.8 mg/dL — AB (ref 8.9–10.3)
CO2: 20 mmol/L — AB (ref 22–32)
Chloride: 117 mmol/L — ABNORMAL HIGH (ref 101–111)
Creatinine, Ser: 1.54 mg/dL — ABNORMAL HIGH (ref 0.61–1.24)
GFR calc Af Amer: 49 mL/min — ABNORMAL LOW (ref >60)
GFR, EST NON AFRICAN AMERICAN: 42 mL/min — AB (ref >60)
GLUCOSE: 135 mg/dL — AB (ref 65–99)
Potassium: 5.1 mmol/L (ref 3.5–5.1)
Sodium: 144 mmol/L (ref 135–145)

## 2016-02-11 LAB — TRIGLYCERIDES: Triglycerides: 173 mg/dL — ABNORMAL HIGH (ref <150)

## 2016-02-11 MED ORDER — FUROSEMIDE 10 MG/ML IJ SOLN
40.0000 mg | Freq: Once | INTRAMUSCULAR | Status: AC
  Administered 2016-02-11: 40 mg via INTRAVENOUS
  Filled 2016-02-11: qty 4

## 2016-02-11 NOTE — Progress Notes (Signed)
Pt ABG critical result from [place] @ [phone] , RRT PH 7.172, Co2 64.7, P02 191, Bicarb 19, Sats 98.7. MD from [facility] responded to result and put in orders. RRT responded and is at bedside.  Ericka Pontiff, RN 5:31 AM 02/11/16

## 2016-02-11 NOTE — Progress Notes (Signed)
Dr Jerilee Hoh notified of ABG results, no new orders at this time.

## 2016-02-11 NOTE — Progress Notes (Signed)
Subjective: Events of last night noted. His abdomen has become distended and he's not had a bowel movement in several days. Abdominal x-ray which I personally reviewed does not show obstruction. I think his abdominal distention may have something to do with his worsened ventilation.  Objective: Vital signs in last 24 hours: Temp:  [98 F (36.7 C)-99.1 F (37.3 C)] 98.9 F (37.2 C) (01/27 0600) Pulse Rate:  [34-134] 73 (01/27 0600) Resp:  [17-28] 28 (01/27 0600) BP: (96-127)/(57-84) 96/65 (01/27 0600) SpO2:  [81 %-100 %] 81 % (01/27 0600) FiO2 (%):  [40 %-50 %] 40 % (01/27 0531) Weight:  [57.5 kg (126 lb 12.2 oz)] 57.5 kg (126 lb 12.2 oz) (01/27 0400) Weight change: -4.6 kg (-10 lb 2.3 oz) Last BM Date:  (PTA >4 days)  Intake/Output from previous day: 01/26 0701 - 01/27 0700 In: 410.4 [I.V.:360.4; IV Piggyback:50] Out: 850 [Urine:850]  PHYSICAL EXAM General appearance: He is fully sedated. He is intubated and on the ventilator. Resp: Lungs are relatively clear with prolonged expiratory phase Cardio: regular rate and rhythm, S1, S2 normal, no murmur, click, rub or gallop GI: His abdomen is distended nontender but he is sedated. Bowel sounds hypoactive to absent Extremities: He has a little bit of puffiness Skin warm and dry. He has some leakage of urine around his Foley catheter  Lab Results:  Results for orders placed or performed during the hospital encounter of 02/08/2016 (from the past 48 hour(s))  Glucose, capillary     Status: Abnormal   Collection Time: 02/09/16  8:13 AM  Result Value Ref Range   Glucose-Capillary 118 (H) 65 - 99 mg/dL  Magnesium     Status: None   Collection Time: 02/09/16  8:24 AM  Result Value Ref Range   Magnesium 2.1 1.7 - 2.4 mg/dL  Phosphorus     Status: None   Collection Time: 02/09/16  8:24 AM  Result Value Ref Range   Phosphorus 3.8 2.5 - 4.6 mg/dL  Culture, blood (Routine X 2) w Reflex to ID Panel     Status: None (Preliminary result)   Collection Time: 02/09/16  8:24 AM  Result Value Ref Range   Specimen Description BLOOD LEFT HAND    Special Requests BOTTLES DRAWN AEROBIC ONLY 6CC    Culture NO GROWTH 2 DAYS    Report Status PENDING   Troponin I (q 6hr x 3)     Status: Abnormal   Collection Time: 02/09/16  8:27 AM  Result Value Ref Range   Troponin I 0.74 (HH) <0.03 ng/mL    Comment: CRITICAL RESULT CALLED TO, READ BACK BY AND VERIFIED WITH: MOTLEY,J AT 9:10AM ON 02/09/16 BY FESTERMAN,C   Culture, blood (Routine X 2) w Reflex to ID Panel     Status: None (Preliminary result)   Collection Time: 02/09/16  9:31 AM  Result Value Ref Range   Specimen Description BLOOD RIGHT HAND    Special Requests BOTTLES DRAWN AEROBIC AND ANAEROBIC 6cc    Culture NO GROWTH 2 DAYS    Report Status PENDING   Triglycerides     Status: None   Collection Time: 02/09/16  9:31 AM  Result Value Ref Range   Triglycerides 106 <150 mg/dL    Comment: RESULTS CONFIRMED BY MANUAL DILUTION  Glucose, capillary     Status: Abnormal   Collection Time: 02/09/16 10:51 AM  Result Value Ref Range   Glucose-Capillary 110 (H) 65 - 99 mg/dL  Blood gas, arterial     Status:  Abnormal   Collection Time: 02/09/16  2:40 PM  Result Value Ref Range   FIO2 0.70    Delivery systems VENTILATOR    Mode PRESSURE REGULATED VOLUME CONTROL    VT 500 mL   LHR 16 resp/min   Peep/cpap 5.0 cm H20   pH, Arterial 7.197 (LL) 7.350 - 7.450    Comment: CRITICAL RESULT CALLED TO, READ BACK BY AND VERIFIED WITH: ERIN WILLIE,RN AT 1444 BY WENDY VIA,RRT,RCP ON 02/09/2016    pCO2 arterial 57.2 (H) 32.0 - 48.0 mmHg   pO2, Arterial 269 (H) 83.0 - 108.0 mmHg   Bicarbonate 18.8 (L) 20.0 - 28.0 mmol/L   Acid-base deficit 5.5 (H) 0.0 - 2.0 mmol/L   O2 Saturation 99.2 %   Patient temperature 99.2    Collection site LEFT RADIAL    Drawn by [id number]    Sample type ARTERIAL DRAW    Allens test (pass/fail) PASS PASS  Troponin I (q 6hr x 3)     Status: Abnormal   Collection Time:  02/09/16  3:00 PM  Result Value Ref Range   Troponin I 0.86 (HH) <0.03 ng/mL    Comment: CRITICAL RESULT CALLED TO, READ BACK BY AND VERIFIED WITH: WILLIE,E AT 1550 ON 02/09/2016 BY ISLEY,B   Glucose, capillary     Status: Abnormal   Collection Time: 02/09/16  3:59 PM  Result Value Ref Range   Glucose-Capillary 129 (H) 65 - 99 mg/dL  Magnesium     Status: None   Collection Time: 02/09/16  5:03 PM  Result Value Ref Range   Magnesium 2.0 1.7 - 2.4 mg/dL  Phosphorus     Status: None   Collection Time: 02/09/16  5:03 PM  Result Value Ref Range   Phosphorus 3.7 2.5 - 4.6 mg/dL  Blood gas, arterial     Status: Abnormal   Collection Time: 02/09/16  5:05 PM  Result Value Ref Range   FIO2 50.00    Delivery systems VENTILATOR    Mode PRESSURE REGULATED VOLUME CONTROL    VT 500 mL   LHR 20 resp/min   Peep/cpap 5.0 cm H20   pH, Arterial 7.227 (L) 7.350 - 7.450   pCO2 arterial 52.2 (H) 32.0 - 48.0 mmHg   pO2, Arterial 122.0 (H) 83.0 - 108.0 mmHg   Bicarbonate 19.1 (L) 20.0 - 28.0 mmol/L   Acid-base deficit 5.5 (H) 0.0 - 2.0 mmol/L   O2 Saturation 97.9 %   Patient temperature 37.0    Collection site LEFT RADIAL    Drawn by [id number]    Sample type ARTERIAL    Allens test (pass/fail) PASS PASS  Troponin I (q 6hr x 3)     Status: Abnormal   Collection Time: 02/09/16  7:24 PM  Result Value Ref Range   Troponin I 2.18 (HH) <0.03 ng/mL    Comment: DELTA CHECK NOTED  Glucose, capillary     Status: Abnormal   Collection Time: 02/09/16  7:25 PM  Result Value Ref Range   Glucose-Capillary 136 (H) 65 - 99 mg/dL   Comment 1 Notify RN   Glucose, capillary     Status: Abnormal   Collection Time: 02/09/16 11:57 PM  Result Value Ref Range   Glucose-Capillary 143 (H) 65 - 99 mg/dL  Glucose, capillary     Status: Abnormal   Collection Time: 02/10/16  3:41 AM  Result Value Ref Range   Glucose-Capillary 166 (H) 65 - 99 mg/dL   Comment 1 Notify RN   Blood gas,  arterial     Status: Abnormal    Collection Time: 02/10/16  5:15 AM  Result Value Ref Range   FIO2 50.00    Delivery systems VENTILATOR    Mode PRESSURE REGULATED VOLUME CONTROL    VT 500 mL   LHR 20 resp/min   Peep/cpap 5.0 cm H20   pH, Arterial 7.225 (L) 7.350 - 7.450   pCO2 arterial 55.8 (H) 32.0 - 48.0 mmHg   pO2, Arterial 199.0 (H) 83.0 - 108.0 mmHg   Bicarbonate 19.7 (L) 20.0 - 28.0 mmol/L   Acid-base deficit 4.3 (H) 0.0 - 2.0 mmol/L   O2 Saturation 98.9 %   Patient temperature 37.0    Collection site LEFT RADIAL    Drawn by [id number]    Sample type ARTERIAL    Allens test (pass/fail) PASS PASS  Magnesium     Status: None   Collection Time: 02/10/16  5:30 AM  Result Value Ref Range   Magnesium 2.3 1.7 - 2.4 mg/dL  Phosphorus     Status: None   Collection Time: 02/10/16  5:30 AM  Result Value Ref Range   Phosphorus 3.5 2.5 - 4.6 mg/dL  Basic metabolic panel     Status: Abnormal   Collection Time: 02/10/16  5:30 AM  Result Value Ref Range   Sodium 142 135 - 145 mmol/L   Potassium 4.3 3.5 - 5.1 mmol/L   Chloride 113 (H) 101 - 111 mmol/L   CO2 23 22 - 32 mmol/L   Glucose, Bld 172 (H) 65 - 99 mg/dL   BUN 49 (H) 6 - 20 mg/dL   Creatinine, Ser 1.58 (H) 0.61 - 1.24 mg/dL   Calcium 7.7 (L) 8.9 - 10.3 mg/dL   GFR calc non Af Amer 41 (L) >60 mL/min   GFR calc Af Amer 48 (L) >60 mL/min    Comment: (NOTE) The eGFR has been calculated using the CKD EPI equation. This calculation has not been validated in all clinical situations. eGFR's persistently <60 mL/min signify possible Chronic Kidney Disease.    Anion gap 6 5 - 15  CBC     Status: Abnormal   Collection Time: 02/10/16  5:30 AM  Result Value Ref Range   WBC 6.5 4.0 - 10.5 K/uL   RBC 3.93 (L) 4.22 - 5.81 MIL/uL   Hemoglobin 12.5 (L) 13.0 - 17.0 g/dL   HCT 39.5 39.0 - 52.0 %   MCV 100.5 (H) 78.0 - 100.0 fL   MCH 31.8 26.0 - 34.0 pg   MCHC 31.6 30.0 - 36.0 g/dL   RDW 15.3 11.5 - 15.5 %   Platelets 182 150 - 400 K/uL  Glucose, capillary     Status:  Abnormal   Collection Time: 02/10/16  7:31 AM  Result Value Ref Range   Glucose-Capillary 149 (H) 65 - 99 mg/dL   Comment 1 Notify RN   Glucose, capillary     Status: Abnormal   Collection Time: 02/10/16 11:51 AM  Result Value Ref Range   Glucose-Capillary 173 (H) 65 - 99 mg/dL   Comment 1 Notify RN   Blood gas, arterial     Status: Abnormal   Collection Time: 02/10/16  2:30 PM  Result Value Ref Range   FIO2 45.00    Delivery systems VENTILATOR    Mode PRESSURE REGULATED VOLUME CONTROL    VT 500 mL   LHR 22 resp/min   Peep/cpap 5.0 cm H20   pH, Arterial 7.270 (L) 7.350 - 7.450   pCO2  arterial 48.5 (H) 32.0 - 48.0 mmHg   pO2, Arterial 62.9 (L) 83.0 - 108.0 mmHg   Bicarbonate 20.2 20.0 - 28.0 mmol/L   Acid-base deficit 4.3 (H) 0.0 - 2.0 mmol/L   O2 Saturation 90.3 %   Patient temperature 37.0    Collection site LEFT RADIAL    Drawn by [id number]    Sample type ARTERIAL    Allens test (pass/fail) PASS PASS  Magnesium     Status: None   Collection Time: 02/10/16  4:45 PM  Result Value Ref Range   Magnesium 2.3 1.7 - 2.4 mg/dL  Phosphorus     Status: None   Collection Time: 02/10/16  4:45 PM  Result Value Ref Range   Phosphorus 3.2 2.5 - 4.6 mg/dL  Glucose, capillary     Status: Abnormal   Collection Time: 02/10/16  4:57 PM  Result Value Ref Range   Glucose-Capillary 178 (H) 65 - 99 mg/dL   Comment 1 Notify RN   Glucose, capillary     Status: Abnormal   Collection Time: 02/10/16  7:58 PM  Result Value Ref Range   Glucose-Capillary 188 (H) 65 - 99 mg/dL   Comment 1 Notify RN    Comment 2 Document in Chart   Glucose, capillary     Status: Abnormal   Collection Time: 02/10/16 11:59 PM  Result Value Ref Range   Glucose-Capillary 145 (H) 65 - 99 mg/dL  Glucose, capillary     Status: Abnormal   Collection Time: 02/11/16  5:02 AM  Result Value Ref Range   Glucose-Capillary 132 (H) 65 - 99 mg/dL   Comment 1 Notify RN    Comment 2 Document in Chart   Blood gas, arterial      Status: Abnormal   Collection Time: 02/11/16  5:09 AM  Result Value Ref Range   FIO2 0.45    O2 Content 45.0 L/min   Delivery systems VENTILATOR    Mode PRESSURE REGULATED VOLUME CONTROL    VT 500 mL   LHR 22.0 resp/min   Peep/cpap 5.0 cm H20   pH, Arterial 7.172 (LL) 7.350 - 7.450    Comment: CRITICAL RESULT CALLED TO, READ BACK BY AND VERIFIED WITH: TIFFANY ROBERTS,RN BY BFRETWELL RRT ON 02/11/16 AT 0520    pCO2 arterial 64.7 (H) 32.0 - 48.0 mmHg   pO2, Arterial 191 (H) 83.0 - 108.0 mmHg   Bicarbonate 19.0 (L) 20.0 - 28.0 mmol/L   Acid-Base Excess 4.6 (H) 0.0 - 2.0 mmol/L   O2 Saturation 98.7 %   Collection site RIGHT RADIAL    Drawn by [id number]    Sample type ARTERIAL DRAW    Allens test (pass/fail) PASS PASS    ABGS  Recent Labs  02/11/16 0509  PHART 7.172*  PO2ART 191*  HCO3 19.0*   CULTURES Recent Results (from the past 240 hour(s))  MRSA PCR Screening     Status: Abnormal   Collection Time: 02/02/2016  8:30 PM  Result Value Ref Range Status   MRSA by PCR POSITIVE (A) NEGATIVE Final    Comment:        The GeneXpert MRSA Assay (FDA approved for NASAL specimens only), is one component of a comprehensive MRSA colonization surveillance program. It is not intended to diagnose MRSA infection nor to guide or monitor treatment for MRSA infections. RESULT CALLED TO, READ BACK BY AND VERIFIED WITH: ROBERTS,T ON 02/05/2016 AT 2255 BY LOY,C   Culture, blood (Routine X 2) w Reflex to ID Panel  Status: None (Preliminary result)   Collection Time: 02/09/16  8:24 AM  Result Value Ref Range Status   Specimen Description BLOOD LEFT HAND  Final   Special Requests BOTTLES DRAWN AEROBIC ONLY 6CC  Final   Culture NO GROWTH 2 DAYS  Final   Report Status PENDING  Incomplete  Culture, blood (Routine X 2) w Reflex to ID Panel     Status: None (Preliminary result)   Collection Time: 02/09/16  9:31 AM  Result Value Ref Range Status   Specimen Description BLOOD RIGHT HAND  Final    Special Requests BOTTLES DRAWN AEROBIC AND ANAEROBIC 6cc  Final   Culture NO GROWTH 2 DAYS  Final   Report Status PENDING  Incomplete   Studies/Results: Dg Abd 1 View  Result Date: 02/10/2016 CLINICAL DATA:  76 year old male are with abdominal distention and concern for small bowel obstruction. Decreased bowel sounds. EXAM: ABDOMEN - 1 VIEW COMPARISON:  None. FINDINGS: An enteric tube is partially visualized with tip in the proximal stomach. The side port of the enteric tube appears to be at the level of the gastroesophageal junction. There is no bowel dilatation or evidence of obstruction. Air is noted within the stomach and colon. There is thickened appearance of the gastric wall which may be artifactual. Clinical correlation is recommended to evaluate for gastritis. No free air or radiopaque calculi identified. There is degenerative changes of the spine. No acute osseous pathology identified. IMPRESSION: No bowel dilatation or evidence of obstruction. Enteric tube with tip in the proximal stomach. Artifact versus less likely thickening of the gastric wall. Clinical correlation is recommended to evaluate for gastritis. Electronically Signed   By: Anner Crete M.D.   On: 02/10/2016 23:55   Dg Chest Port 1 View  Result Date: 02/10/2016 CLINICAL DATA:  Ventilator dependent EXAM: PORTABLE CHEST 1 VIEW COMPARISON:  02/09/2016 FINDINGS: Endotracheal tube and nasogastric catheter are again seen and stable. Cardiac shadow is within normal limits. Previously seen patchy changes in the right upper lobe have resolved in the interval. No new focal infiltrate is seen. No bony abnormality is noted. IMPRESSION: No acute abnormality seen. Tubes and lines as described. Electronically Signed   By: Inez Catalina M.D.   On: 02/10/2016 09:01   Dg Chest Port 1 View  Result Date: 02/09/2016 CLINICAL DATA:  Irregular heartbeat.  Chest pain . EXAM: PORTABLE CHEST 1 VIEW COMPARISON:  02/09/2016. FINDINGS: Endotracheal  tube noted with tip 3 cm above the carina. NG tube noted with tip below left hemidiaphragm. Mild infiltrate in the right upper lobe. Questionable nodular density is noted in the right apex. Small bilateral pleural effusions. Apical pleural-parenchymal thickening and cystic changes again noted on the right. These changes are consistent with scarring. No pneumothorax. Heart size stable. IMPRESSION: 1.  Endotracheal tube and NG tube in good anatomic position. 2. Questionable nodule right upper lobe. Questionable right upper lobe infiltrate. Follow-up chest x-rays recommended to demonstrate clearing. Electronically Signed   By: Marcello Moores  Register   On: 02/09/2016 08:11    Medications:  Prior to Admission:  Prescriptions Prior to Admission  Medication Sig Dispense Refill Last Dose  . albuterol (PROVENTIL HFA;VENTOLIN HFA) 108 (90 Base) MCG/ACT inhaler Inhale 1-2 puffs into the lungs every 6 (six) hours as needed for wheezing or shortness of breath. 1 Inhaler 0 Past Month at Unknown time  . ALPRAZolam (XANAX) 1 MG tablet Take 1 tablet (1 mg total) by mouth 3 (three) times daily as needed for anxiety. 90 tablet  1 Past Month at Unknown time  . budesonide-formoterol (SYMBICORT) 160-4.5 MCG/ACT inhaler Inhale 2 puffs into the lungs 2 (two) times daily.   Past Month at Unknown time  . FLUoxetine (PROZAC) 40 MG capsule Take 1 capsule (40 mg total) by mouth daily. 30 capsule 3 Past Month at Unknown time  . Ivermectin 0.5 % LOTN Apply thoroughly to dry hair and scalp; leave lotion on hair for 10 minutes, and then rinse with water. 1 Tube 1 Past Month at Unknown time  . loratadine (CLARITIN) 10 MG tablet Take 1 tablet (10 mg total) by mouth daily. 30 tablet 11 Past Month at Unknown time  . omeprazole (PRILOSEC) 20 MG capsule take 1 capsule by mouth once daily 30 capsule 3 Past Month at Unknown time  . propranolol (INDERAL) 10 MG tablet take 1 tablet by mouth twice a day for TREMOR 60 tablet 3 Past Month at Unknown time   . rosuvastatin (CRESTOR) 10 MG tablet take 1 tablet by mouth once daily 30 tablet 3 Past Month at Unknown time  . tiotropium (SPIRIVA) 18 MCG inhalation capsule Place 18 mcg into inhaler and inhale daily.   Past Month at Unknown time  . albuterol (PROVENTIL) (2.5 MG/3ML) 0.083% nebulizer solution Take 3 mLs (2.5 mg total) by nebulization every 6 (six) hours as needed for wheezing or shortness of breath. 150 mL 1   . amLODipine (NORVASC) 10 MG tablet Take 10 mg by mouth daily.   10/27/2015 at Unknown time  . ibuprofen (ADVIL,MOTRIN) 200 MG tablet Take 200 mg by mouth every 6 (six) hours as needed for headache, mild pain or moderate pain. For pain    10/26/2015 at 1700  . nitroGLYCERIN (NITROSTAT) 0.4 MG SL tablet Place 0.4 mg under the tongue every 5 (five) minutes as needed for chest pain.   Past Month at Unknown time  . vitamin B-12 (CYANOCOBALAMIN) 1000 MCG tablet Take 1,000 mcg by mouth daily.   10/27/2015 at Unknown time  . [DISCONTINUED] predniSONE (DELTASONE) 20 MG tablet 2 tabs po daily x 4 days 8 tablet 0    Scheduled: . chlorhexidine gluconate (MEDLINE KIT)  15 mL Mouth Rinse BID  . Chlorhexidine Gluconate Cloth  6 each Topical Q0600  . enoxaparin (LOVENOX) injection  40 mg Subcutaneous Q24H  . famotidine (PEPCID) IV  20 mg Intravenous Q12H  . fentaNYL (SUBLIMAZE) injection  50 mcg Intravenous Once  . ipratropium-albuterol  3 mL Nebulization Q6H  . mouth rinse  15 mL Mouth Rinse QID  . methylPREDNISolone (SOLU-MEDROL) injection  125 mg Intravenous Q6H  . mupirocin ointment  1 application Nasal BID  . nicotine  21 mg Transdermal Daily  . piperacillin-tazobactam (ZOSYN)  IV  3.375 g Intravenous Q8H  . propofol  20 mg Intravenous Once  . propofol (DIPRIVAN) infusion  5-80 mcg/kg/min Intravenous Once  . vancomycin  1,000 mg Intravenous Q24H   Continuous: . sodium chloride 75 mL/hr at 02/09/16 2000  . feeding supplement (VITAL AF 1.2 CAL)    . fentaNYL infusion INTRAVENOUS 150  mcg/hr (02/10/16 0658)  . propofol (DIPRIVAN) infusion 20 mcg/kg/min (02/10/16 2316)   CWU:[id number], albuterol, bisacodyl, fentaNYL, sodium chloride  Assesment:He has acute on chronic hypoxic/hypercapnic respiratory failure. His ventilation was worse overnight and adjustments were made in his ventilator settings but as previously he began having difficulty with increased pressures. Chest x-ray which I personally reviewed doesn't show any change. He may have a little bit of volume overload and I like to give him 1  dose of Lasix. He's not had a bowel movement and he's got abdominal distention so stop tube feedings and put him on suction. Give him a suppository and perhaps an enema Principal Problem:   COPD exacerbation ([place]) Active Problems:   COPD (chronic obstructive pulmonary disease) (H[facility])   Essential hypertension, benign   Respiratory failure ([facility])   Acute on chronic respiratory failure with hypercapnia (H[facility])   Respiratory disorder with ventilator dependence ([place])   Fever   Acute bronchitis   Palliative care encounter   Goals of care, counseling/discussion   DNR (do not resuscitate) discussion    Plan: As above    LOS: 4 days   Christen Wardrop L 02/11/2016, 7:28 AM

## 2016-02-11 NOTE — Progress Notes (Signed)
[facility] Progress Note Patient Name: [name] DOB: 19-Sep-1940 MRN: [id number]   Date of Service  02/11/2016  HPI/Events of Note  Issues with high peak pressures when RR and TV increase.    eICU Interventions  Plan: Reduce RR to 18 Keep TV at 550 cc F/U ABG in 2 hours        DETERDING,ELIZABETH 02/11/2016, 6:22 AM

## 2016-02-11 NOTE — Progress Notes (Signed)
[facility] Progress Note Patient Name: [name] DOB: [birth year] MRN: [id number]   Date of Service  02/11/2016  HPI/Events of Note  Mixed resp and metabolic acidosis with pH 7.17/65/191/19  eICU Interventions  Plan: Increase RR to 28 Increase TV to 550 cc Recheck ABG in 2 hours post vent change     Intervention Category Major Interventions: Acid-Base disturbance - evaluation and management  DETERDING,ELIZABETH 02/11/2016, 5:25 AM

## 2016-02-11 NOTE — Progress Notes (Addendum)
PROGRESS NOTE    [name]  WVP:[id number] DOB: 05/06/1940 DOA: 02/12/2016 PCP: Vic Blackbird, MD     Brief Narrative:  76 year old man admitted to the hospital on 1/23 due to shortness of breath, was intubated in the emergency department. Continued difficulty with resp acidosis on vent. Abdominal distention but Xray without obstruction. Tube feeds have been held.   Assessment & Plan:   Principal Problem:   COPD exacerbation ([place]) Active Problems:   COPD (chronic obstructive pulmonary disease) ([place])   Essential hypertension, benign   Respiratory failure (HCC)   Acute on chronic respiratory failure with hypercapnia (HCC)   Respiratory disorder with ventilator dependence (HCC)   Fever   Acute bronchitis   Palliative care encounter   Goals of care, counseling/discussion   DNR (do not resuscitate) discussion   Acute on chronic hypoxemic respiratory failure, ventilatory dependent -Presumed due to COPD exacerbation, was febrile on admission so unable to completely exclude community-acquired pneumonia and is being treated empirically with antibiotics, despite inconclusive chest x-ray. -Influenza PCR is negative. -Continues to have significant respiratory acidosis, Dr. Luan Pulling is assisting with ventilator management. -Continue steroids for COPD exacerbation, nebs. -Appreciate Dr. Luan Pulling input and recommendations.  Elevated troponins -Likely due to demand ischemia in the face of ongoing severe respiratory illness. Trop with a high of 2.18. -ECHO: EF 54-62%, grade 1 diastolic dysfunction. -No plans for further aggressive cardiac workup at this point.  New-onset A. fib with RVR -Likely due to respiratory illness, rate is currently controlled.    DVT prophylaxis: Lovenox Code Status: Full code Family Communication: No family at bedside; need to have serious GOC with family. Appreciate palliative care input. Very poor prognosis. Disposition Plan: Remains intubated  today  Consultants:   Pulmonary, Dr. Luan Pulling  Procedures:   None  Antimicrobials:  Anti-infectives    Start     Dose/Rate Route Frequency Ordered Stop   02/10/16 2200  vancomycin (VANCOCIN) IVPB 1000 mg/200 mL premix     1,000 mg 200 mL/hr over 60 Minutes Intravenous Every 24 hours 02/10/16 1220     02/09/16 2100  vancomycin (VANCOCIN) 500 mg in sodium chloride 0.9 % 100 mL IVPB  Status:  Discontinued     500 mg 100 mL/hr over 60 Minutes Intravenous Every 12 hours 02/09/16 0846 02/10/16 1219   02/09/16 0900  piperacillin-tazobactam (ZOSYN) IVPB 3.375 g     3.375 g 12.5 mL/hr over 240 Minutes Intravenous Every 8 hours 02/09/16 0846     02/09/16 0900  vancomycin (VANCOCIN) 1,250 mg in sodium chloride 0.9 % 250 mL IVPB     1,250 mg 166.7 mL/hr over 90 Minutes Intravenous  Once 02/09/16 0846 02/09/16 1108   02/08/16 2200  oseltamivir (TAMIFLU) capsule 75 mg  Status:  Discontinued     75 mg Oral 2 times daily 02/08/16 1653 02/08/16 1702   02/08/16 1715  oseltamivir (TAMIFLU) capsule 75 mg  Status:  Discontinued     75 mg Oral 2 times daily 02/08/16 1703 02/08/16 1703   02/08/16 1715  oseltamivir (TAMIFLU) capsule 30 mg  Status:  Discontinued     30 mg Oral 2 times daily 02/08/16 1703 02/09/16 0942   01/23/2016 2300  cefTRIAXone (ROCEPHIN) 1 g in dextrose 5 % 50 mL IVPB  Status:  Discontinued     1 g 100 mL/hr over 30 Minutes Intravenous Every 24 hours 02/10/2016 2100 02/09/16 0820   02/04/2016 2200  azithromycin (ZITHROMAX) 500 mg in dextrose 5 % 250 mL IVPB  Status:  Discontinued     500 mg 250 mL/hr over 60 Minutes Intravenous Every 24 hours 01/22/2016 2100 02/09/16 0820       Subjective: Profoundly sedated  Objective: Vitals:   02/11/16 0600 02/11/16 0800 02/11/16 0820 02/11/16 0837  BP: 96/65 112/81    Pulse: 73     Resp: (!) 28 17    Temp: 98.9 F (37.2 C) 98.8 F (37.1 C)    TempSrc:      SpO2: (!) 81%  100% 100%  Weight:      Height:    5' 8"  (1.727 m)     Intake/Output Summary (Last 24 hours) at 02/11/16 0905 Last data filed at 02/11/16 0400  Gross per 24 hour  Intake           410.42 ml  Output              850 ml  Net          -439.58 ml   Filed Weights   02/09/16 0500 02/10/16 0600 02/11/16 0400  Weight: 62.1 kg (136 lb 14.5 oz) 62.1 kg (136 lb 14.5 oz) 57.5 kg (126 lb 12.2 oz)    Examination:  General exam: Intubated, sedated Respiratory system: Clear to auscultation. Respiratory effort normal. Cardiovascular system:RRR. No murmurs, rubs, gallops. Gastrointestinal system: Abdomen is distended, soft and nontender. No organomegaly or masses felt. Normal bowel sounds heard. Central nervous system: Unable to fully assess as he is currently intubated, however moves all 4 spontaneously Extremities: No C/C/E, +pedal pulses Skin: No rashes, lesions or ulcers Psychiatry: Unable to assess as he is currently intubated    Data Reviewed: I have personally reviewed following labs and imaging studies  CBC:  Recent Labs Lab 02/05/2016 1523 01/18/2016 1559 02/08/16 0323 02/10/16 0530  WBC  --  7.2 4.9 6.5  NEUTROABS  --  6.0  --   --   HGB 16.7 15.7 12.8* 12.5*  HCT 49.0 48.7 39.2 39.5  MCV  --  98.4 98.5 100.5*  PLT  --  154 152 836   Basic Metabolic Panel:  Recent Labs Lab 02/12/2016 1523 01/21/2016 1559 02/08/16 0323 02/09/16 0824 02/09/16 1703 02/10/16 0530 02/10/16 1645  NA 143 142 144  --   --  142  --   K 6.3* 4.3 4.6  --   --  4.3  --   CL 106 104 111  --   --  113*  --   CO2  --  30 24  --   --  23  --   GLUCOSE 120* 104* 161*  --   --  172*  --   BUN 10 9 15   --   --  49*  --   CREATININE 0.80 0.91 1.06  --   --  1.58*  --   CALCIUM  --  8.5* 7.5*  --   --  7.7*  --   MG  --   --   --  2.1 2.0 2.3 2.3  PHOS  --   --   --  3.8 3.7 3.5 3.2   GFR: Estimated Creatinine Clearance: 32.9 mL/min (by C-G formula based on SCr of 1.58 mg/dL (H)). Liver Function Tests:  Recent Labs Lab 01/29/2016 1559  AST 24  ALT  10*  ALKPHOS 87  BILITOT 0.5  PROT 6.9  ALBUMIN 3.9   No results for input(s): LIPASE, AMYLASE in the last 168 hours. No results for input(s): AMMONIA in the last 168 hours. Coagulation  Profile:  Recent Labs Lab 01/21/2016 1630  INR 1.02   Cardiac Enzymes:  Recent Labs Lab 02/09/16 0827 02/09/16 1500 02/09/16 1924  TROPONINI 0.74* 0.86* 2.18*   BNP (last 3 results) No results for input(s): PROBNP in the last 8760 hours. HbA1C: No results for input(s): HGBA1C in the last 72 hours. CBG:  Recent Labs Lab 02/10/16 1657 02/10/16 1958 02/10/16 2359 02/11/16 0502 02/11/16 0742  GLUCAP 178* 188* 145* 132* 142*   Lipid Profile:  Recent Labs  02/09/16 0931  TRIG 106   Thyroid Function Tests: No results for input(s): TSH, T4TOTAL, FREET4, T3FREE, THYROIDAB in the last 72 hours. Anemia Panel: No results for input(s): VITAMINB12, FOLATE, FERRITIN, TIBC, IRON, RETICCTPCT in the last 72 hours. Urine analysis:    Component Value Date/Time   COLORURINE YELLOW 02/09/2016 1544   APPEARANCEUR HAZY (A) 02/15/2016 1544   LABSPEC 1.015 02/12/2016 1544   PHURINE 5.0 01/27/2016 1544   GLUCOSEU NEGATIVE 01/16/2016 1544   HGBUR SMALL (A) 01/21/2016 1544   BILIRUBINUR NEGATIVE 01/20/2016 1544   KETONESUR 20 (A) 01/18/2016 1544   PROTEINUR 100 (A) 02/08/2016 1544   UROBILINOGEN 0.2 04/19/2014 1946   NITRITE NEGATIVE 02/14/2016 1544   LEUKOCYTESUR NEGATIVE 02/10/2016 1544   Sepsis Labs: '@LABRCNTIP'$ (procalcitonin:4,lacticidven:4)  ) Recent Results (from the past 240 hour(s))  MRSA PCR Screening     Status: Abnormal   Collection Time: 01/22/2016  8:30 PM  Result Value Ref Range Status   MRSA by PCR POSITIVE (A) NEGATIVE Final    Comment:        The GeneXpert MRSA Assay (FDA approved for NASAL specimens only), is one component of a comprehensive MRSA colonization surveillance program. It is not intended to diagnose MRSA infection nor to guide or monitor treatment  for MRSA infections. RESULT CALLED TO, READ BACK BY AND VERIFIED WITH: ROBERTS,T ON 02/12/2016 AT 2255 BY LOY,C   Culture, blood (Routine X 2) w Reflex to ID Panel     Status: None (Preliminary result)   Collection Time: 02/09/16  8:24 AM  Result Value Ref Range Status   Specimen Description BLOOD LEFT HAND  Final   Special Requests BOTTLES DRAWN AEROBIC ONLY 6CC  Final   Culture NO GROWTH 2 DAYS  Final   Report Status PENDING  Incomplete  Culture, blood (Routine X 2) w Reflex to ID Panel     Status: None (Preliminary result)   Collection Time: 02/09/16  9:31 AM  Result Value Ref Range Status   Specimen Description BLOOD RIGHT HAND  Final   Special Requests BOTTLES DRAWN AEROBIC AND ANAEROBIC 6cc  Final   Culture NO GROWTH 2 DAYS  Final   Report Status PENDING  Incomplete         Radiology Studies: Dg Abd 1 View  Result Date: 02/10/2016 CLINICAL DATA:  77 year old male are with abdominal distention and concern for small bowel obstruction. Decreased bowel sounds. EXAM: ABDOMEN - 1 VIEW COMPARISON:  None. FINDINGS: An enteric tube is partially visualized with tip in the proximal stomach. The side port of the enteric tube appears to be at the level of the gastroesophageal junction. There is no bowel dilatation or evidence of obstruction. Air is noted within the stomach and colon. There is thickened appearance of the gastric wall which may be artifactual. Clinical correlation is recommended to evaluate for gastritis. No free air or radiopaque calculi identified. There is degenerative changes of the spine. No acute osseous pathology identified. IMPRESSION: No bowel dilatation or evidence of  obstruction. Enteric tube with tip in the proximal stomach. Artifact versus less likely thickening of the gastric wall. Clinical correlation is recommended to evaluate for gastritis. Electronically Signed   By: Anner Crete M.D.   On: 02/10/2016 23:55   Dg Chest Port 1 View  Result Date:  02/11/2016 CLINICAL DATA:  Short of breath. Ventilated patient. Subsequent encounter. EXAM: PORTABLE CHEST 1 VIEW COMPARISON:  02/10/2016 FINDINGS: Endotracheal tube and nasal/orogastric tube are stable. Lungs are hyperexpanded. There changes of emphysema with decrease markings in the peripheral lungs most evident on the left. No evidence of pneumonia or pulmonary edema. No pleural effusion or pneumothorax. IMPRESSION: 1. No acute findings in the lungs. 2. COPD. 3. Support apparatus is stable and well positioned. Electronically Signed   By: Lajean Manes M.D.   On: 02/11/2016 08:04   Dg Chest Port 1 View  Result Date: 02/10/2016 CLINICAL DATA:  Ventilator dependent EXAM: PORTABLE CHEST 1 VIEW COMPARISON:  02/09/2016 FINDINGS: Endotracheal tube and nasogastric catheter are again seen and stable. Cardiac shadow is within normal limits. Previously seen patchy changes in the right upper lobe have resolved in the interval. No new focal infiltrate is seen. No bony abnormality is noted. IMPRESSION: No acute abnormality seen. Tubes and lines as described. Electronically Signed   By: Inez Catalina M.D.   On: 02/10/2016 09:01        Scheduled Meds: . chlorhexidine gluconate (MEDLINE KIT)  15 mL Mouth Rinse BID  . Chlorhexidine Gluconate Cloth  6 each Topical Q0600  . enoxaparin (LOVENOX) injection  40 mg Subcutaneous Q24H  . famotidine (PEPCID) IV  20 mg Intravenous Q12H  . fentaNYL (SUBLIMAZE) injection  50 mcg Intravenous Once  . furosemide  40 mg Intravenous Once  . ipratropium-albuterol  3 mL Nebulization Q6H  . mouth rinse  15 mL Mouth Rinse QID  . methylPREDNISolone (SOLU-MEDROL) injection  125 mg Intravenous Q6H  . mupirocin ointment  1 application Nasal BID  . nicotine  21 mg Transdermal Daily  . piperacillin-tazobactam (ZOSYN)  IV  3.375 g Intravenous Q8H  . propofol  20 mg Intravenous Once  . propofol (DIPRIVAN) infusion  5-80 mcg/kg/min Intravenous Once  . vancomycin  1,000 mg Intravenous  Q24H   Continuous Infusions: . sodium chloride 75 mL/hr at 02/09/16 2000  . feeding supplement (VITAL AF 1.2 CAL)    . fentaNYL infusion INTRAVENOUS 150 mcg/hr (02/10/16 0658)  . propofol (DIPRIVAN) infusion 20 mcg/kg/min (02/10/16 2316)     LOS: 4 days    Critical care time spent: 45 minutes. Greater than 50% of this time was spent in direct contact with the patient coordinating care.     Lelon Frohlich, MD [facility] Pager [phone]  If 7PM-7AM, please contact night-coverage [email or web address] Password [email or web address] 02/11/2016, 9:05 AM

## 2016-02-12 ENCOUNTER — Inpatient Hospital Stay (HOSPITAL_COMMUNITY): Payer: Medicare HMO

## 2016-02-12 LAB — BLOOD GAS, ARTERIAL
Acid-Base Excess: 3.1 mmol/L — ABNORMAL HIGH (ref 0.0–2.0)
Bicarbonate: 22.1 mmol/L (ref 20.0–28.0)
DRAWN BY: 317771
FIO2: 0.4
O2 CONTENT: 40 L/min
O2 Saturation: 99 %
PEEP: 5 cmH2O
PH ART: 7.393 (ref 7.350–7.450)
PO2 ART: 181 mmHg — AB (ref 83.0–108.0)
RATE: 25 resp/min
VT: 550 mL
pCO2 arterial: 35.2 mmHg (ref 32.0–48.0)

## 2016-02-12 LAB — BASIC METABOLIC PANEL
Anion gap: 8 (ref 5–15)
BUN: 49 mg/dL — AB (ref 6–20)
CHLORIDE: 115 mmol/L — AB (ref 101–111)
CO2: 23 mmol/L (ref 22–32)
CREATININE: 1.45 mg/dL — AB (ref 0.61–1.24)
Calcium: 7.7 mg/dL — ABNORMAL LOW (ref 8.9–10.3)
GFR calc Af Amer: 53 mL/min — ABNORMAL LOW (ref >60)
GFR calc non Af Amer: 46 mL/min — ABNORMAL LOW (ref >60)
Glucose, Bld: 129 mg/dL — ABNORMAL HIGH (ref 65–99)
POTASSIUM: 4.2 mmol/L (ref 3.5–5.1)
SODIUM: 146 mmol/L — AB (ref 135–145)

## 2016-02-12 LAB — CBC
HCT: 42.7 % (ref 39.0–52.0)
Hemoglobin: 13.7 g/dL (ref 13.0–17.0)
MCH: 31.6 pg (ref 26.0–34.0)
MCHC: 32.1 g/dL (ref 30.0–36.0)
MCV: 98.4 fL (ref 78.0–100.0)
PLATELETS: 131 10*3/uL — AB (ref 150–400)
RBC: 4.34 MIL/uL (ref 4.22–5.81)
RDW: 15.7 % — AB (ref 11.5–15.5)
WBC: 4.7 10*3/uL (ref 4.0–10.5)

## 2016-02-12 LAB — GLUCOSE, CAPILLARY
GLUCOSE-CAPILLARY: 123 mg/dL — AB (ref 65–99)
GLUCOSE-CAPILLARY: 126 mg/dL — AB (ref 65–99)
GLUCOSE-CAPILLARY: 130 mg/dL — AB (ref 65–99)
GLUCOSE-CAPILLARY: 159 mg/dL — AB (ref 65–99)
Glucose-Capillary: 126 mg/dL — ABNORMAL HIGH (ref 65–99)

## 2016-02-12 MED ORDER — FUROSEMIDE 10 MG/ML IJ SOLN
20.0000 mg | Freq: Every day | INTRAMUSCULAR | Status: DC
  Administered 2016-02-13 – 2016-02-14 (×2): 20 mg via INTRAVENOUS
  Filled 2016-02-12 (×3): qty 2

## 2016-02-12 MED ORDER — FUROSEMIDE 10 MG/ML IJ SOLN
40.0000 mg | Freq: Once | INTRAMUSCULAR | Status: AC
  Administered 2016-02-12: 40 mg via INTRAVENOUS
  Filled 2016-02-12: qty 4

## 2016-02-12 MED ORDER — FENTANYL CITRATE (PF) 2500 MCG/50ML IJ SOLN
INTRAMUSCULAR | Status: AC
  Filled 2016-02-12: qty 50

## 2016-02-12 MED ORDER — METHYLPREDNISOLONE SODIUM SUCC 125 MG IJ SOLR
60.0000 mg | Freq: Four times a day (QID) | INTRAMUSCULAR | Status: DC
  Administered 2016-02-12 – 2016-02-19 (×28): 60 mg via INTRAVENOUS
  Filled 2016-02-12 (×28): qty 2

## 2016-02-12 NOTE — Progress Notes (Signed)
Subjective: No new changes overnight. He remains intubated and on the ventilator.  Objective: Vital signs in last 24 hours: Temp:  [97.6 F (36.4 C)-99.3 F (37.4 C)] 97.8 F (36.6 C) (01/28 0700) Pulse Rate:  [30-112] 62 (01/28 0400) Resp:  [18-25] 25 (01/28 0700) BP: (96-152)/(62-91) 112/75 (01/28 0700) SpO2:  [97 %-100 %] 98 % (01/28 0815) FiO2 (%):  [40 %] 40 % (01/28 0815) Weight:  [64.6 kg (142 lb 6.7 oz)] 64.6 kg (142 lb 6.7 oz) (01/28 0500) Weight change: 7.1 kg (15 lb 10.4 oz) Last BM Date: 02/11/16  Intake/Output from previous day: 01/27 0701 - 01/28 0700 In: 2405.3 [I.V.:1892.8; IV Piggyback:512.5] Out: 2550 [Urine:2100; Emesis/NG output:450]  PHYSICAL EXAM General appearance: Intubated sedated on mechanical ventilation Resp: rhonchi bilaterally Cardio: regular rate and rhythm, S1, S2 normal, no murmur, click, rub or gallop GI: His abdomen is still distended and somewhat "tight" Extremities: He still has some puffiness of his extremities Skin warm and dry. Mucous membranes are moist  Lab Results:  Results for orders placed or performed during the hospital encounter of 02/01/2016 (from the past 48 hour(s))  Glucose, capillary     Status: Abnormal   Collection Time: 02/10/16 11:51 AM  Result Value Ref Range   Glucose-Capillary 173 (H) 65 - 99 mg/dL   Comment 1 Notify RN   Blood gas, arterial     Status: Abnormal   Collection Time: 02/10/16  2:30 PM  Result Value Ref Range   FIO2 45.00    Delivery systems VENTILATOR    Mode PRESSURE REGULATED VOLUME CONTROL    VT 500 mL   LHR 22 resp/min   Peep/cpap 5.0 cm H20   pH, Arterial 7.270 (L) 7.350 - 7.450   pCO2 arterial 48.5 (H) 32.0 - 48.0 mmHg   pO2, Arterial 62.9 (L) 83.0 - 108.0 mmHg   Bicarbonate 20.2 20.0 - 28.0 mmol/L   Acid-base deficit 4.3 (H) 0.0 - 2.0 mmol/L   O2 Saturation 90.3 %   Patient temperature 37.0    Collection site LEFT RADIAL    Drawn by [id number]    Sample type ARTERIAL    Allens test  (pass/fail) PASS PASS  Magnesium     Status: None   Collection Time: 02/10/16  4:45 PM  Result Value Ref Range   Magnesium 2.3 1.7 - 2.4 mg/dL  Phosphorus     Status: None   Collection Time: 02/10/16  4:45 PM  Result Value Ref Range   Phosphorus 3.2 2.5 - 4.6 mg/dL  Glucose, capillary     Status: Abnormal   Collection Time: 02/10/16  4:57 PM  Result Value Ref Range   Glucose-Capillary 178 (H) 65 - 99 mg/dL   Comment 1 Notify RN   Glucose, capillary     Status: Abnormal   Collection Time: 02/10/16  7:58 PM  Result Value Ref Range   Glucose-Capillary 188 (H) 65 - 99 mg/dL   Comment 1 Notify RN    Comment 2 Document in Chart   Glucose, capillary     Status: Abnormal   Collection Time: 02/10/16 11:59 PM  Result Value Ref Range   Glucose-Capillary 145 (H) 65 - 99 mg/dL  Glucose, capillary     Status: Abnormal   Collection Time: 02/11/16  5:02 AM  Result Value Ref Range   Glucose-Capillary 132 (H) 65 - 99 mg/dL   Comment 1 Notify RN    Comment 2 Document in Chart   Blood gas, arterial  Status: Abnormal   Collection Time: 02/11/16  5:09 AM  Result Value Ref Range   FIO2 0.45    O2 Content 45.0 L/min   Delivery systems VENTILATOR    Mode PRESSURE REGULATED VOLUME CONTROL    VT 500 mL   LHR 22.0 resp/min   Peep/cpap 5.0 cm H20   pH, Arterial 7.172 (LL) 7.350 - 7.450    Comment: CRITICAL RESULT CALLED TO, READ BACK BY AND VERIFIED WITH: TIFFANY ROBERTS,RN BY BFRETWELL RRT ON 02/11/16 AT 0520    pCO2 arterial 64.7 (H) 32.0 - 48.0 mmHg   pO2, Arterial 191 (H) 83.0 - 108.0 mmHg   Bicarbonate 19.0 (L) 20.0 - 28.0 mmol/L   Acid-Base Excess 4.6 (H) 0.0 - 2.0 mmol/L   O2 Saturation 98.7 %   Collection site RIGHT RADIAL    Drawn by [id number]    Sample type ARTERIAL DRAW    Allens test (pass/fail) PASS PASS  Glucose, capillary     Status: Abnormal   Collection Time: 02/11/16  7:42 AM  Result Value Ref Range   Glucose-Capillary 142 (H) 65 - 99 mg/dL  Triglycerides     Status:  Abnormal   Collection Time: 02/11/16  9:03 AM  Result Value Ref Range   Triglycerides 173 (H) <150 mg/dL  Blood gas, arterial     Status: Abnormal   Collection Time: 02/11/16  9:07 AM  Result Value Ref Range   FIO2 0.40    O2 Content 40.0 L/min   Delivery systems VENTILATOR    Mode PRESSURE REGULATED VOLUME CONTROL    VT 550 mL   LHR 18 resp/min   Peep/cpap 5.0 cm H20   pH, Arterial 7.198 (LL) 7.350 - 7.450    Comment: CRITICAL RESULT CALLED TO, READ BACK BY AND VERIFIED WITH: MYSTI MCDANIELS,RN ON 02/11/2016 BY PEVIANY LAWSON,RRT AT 0915.    pCO2 arterial 60.0 (H) 32.0 - 48.0 mmHg   pO2, Arterial 141 (H) 83.0 - 108.0 mmHg   Bicarbonate 19.5 (L) 20.0 - 28.0 mmol/L   Acid-base deficit 4.5 (H) 0.0 - 2.0 mmol/L   O2 Saturation 98.4 %   Collection site LEFT RADIAL    Drawn by [id number]    Sample type ARTERIAL    Allens test (pass/fail) PASS PASS  Basic metabolic panel     Status: Abnormal   Collection Time: 02/11/16  9:33 AM  Result Value Ref Range   Sodium 144 135 - 145 mmol/L   Potassium 5.1 3.5 - 5.1 mmol/L   Chloride 117 (H) 101 - 111 mmol/L   CO2 20 (L) 22 - 32 mmol/L   Glucose, Bld 135 (H) 65 - 99 mg/dL   BUN 56 (H) 6 - 20 mg/dL   Creatinine, Ser 1.54 (H) 0.61 - 1.24 mg/dL   Calcium 7.8 (L) 8.9 - 10.3 mg/dL   GFR calc non Af Amer 42 (L) >60 mL/min   GFR calc Af Amer 49 (L) >60 mL/min    Comment: (NOTE) The eGFR has been calculated using the CKD EPI equation. This calculation has not been validated in all clinical situations. eGFR's persistently <60 mL/min signify possible Chronic Kidney Disease.    Anion gap 7 5 - 15  CBC     Status: Abnormal   Collection Time: 02/11/16  9:33 AM  Result Value Ref Range   WBC 7.1 4.0 - 10.5 K/uL   RBC 4.39 4.22 - 5.81 MIL/uL   Hemoglobin 13.6 13.0 - 17.0 g/dL   HCT 44.4 39.0 - 52.0 %  MCV 101.1 (H) 78.0 - 100.0 fL   MCH 31.0 26.0 - 34.0 pg   MCHC 30.6 30.0 - 36.0 g/dL   RDW 15.7 (H) 11.5 - 15.5 %   Platelets 65 (L) 150 - 400  K/uL    Comment: DELTA CHECK NOTED SPECIMEN CHECKED FOR CLOTS PLATELET COUNT CONFIRMED BY SMEAR   Glucose, capillary     Status: Abnormal   Collection Time: 02/11/16 12:27 PM  Result Value Ref Range   Glucose-Capillary 120 (H) 65 - 99 mg/dL  Glucose, capillary     Status: Abnormal   Collection Time: 02/11/16  4:31 PM  Result Value Ref Range   Glucose-Capillary 120 (H) 65 - 99 mg/dL   Comment 1 Notify RN    Comment 2 Document in Chart   Glucose, capillary     Status: Abnormal   Collection Time: 02/11/16  7:29 PM  Result Value Ref Range   Glucose-Capillary 128 (H) 65 - 99 mg/dL  Glucose, capillary     Status: Abnormal   Collection Time: 02/12/16 12:07 AM  Result Value Ref Range   Glucose-Capillary 159 (H) 65 - 99 mg/dL  Glucose, capillary     Status: Abnormal   Collection Time: 02/12/16  4:22 AM  Result Value Ref Range   Glucose-Capillary 126 (H) 65 - 99 mg/dL  Blood gas, arterial     Status: Abnormal   Collection Time: 02/12/16  4:46 AM  Result Value Ref Range   FIO2 0.40    O2 Content 40.0 L/min   Delivery systems VENTILATOR    Mode PRESSURE REGULATED VOLUME CONTROL    VT 550 mL   LHR 25.0 resp/min   Peep/cpap 5.0 cm H20   pH, Arterial 7.393 7.350 - 7.450   pCO2 arterial 35.2 32.0 - 48.0 mmHg   pO2, Arterial 181 (H) 83.0 - 108.0 mmHg   Bicarbonate 22.1 20.0 - 28.0 mmol/L   Acid-Base Excess 3.1 (H) 0.0 - 2.0 mmol/L   O2 Saturation 99.0 %   Collection site LEFT RADIAL    Drawn by [id number]    Sample type ARTERIAL DRAW    Allens test (pass/fail) PASS PASS  CBC     Status: Abnormal   Collection Time: 02/12/16  7:34 AM  Result Value Ref Range   WBC 4.7 4.0 - 10.5 K/uL   RBC 4.34 4.22 - 5.81 MIL/uL   Hemoglobin 13.7 13.0 - 17.0 g/dL   HCT 42.7 39.0 - 52.0 %   MCV 98.4 78.0 - 100.0 fL   MCH 31.6 26.0 - 34.0 pg   MCHC 32.1 30.0 - 36.0 g/dL   RDW 15.7 (H) 11.5 - 15.5 %   Platelets 131 (L) 150 - 400 K/uL    Comment: SPECIMEN CHECKED FOR CLOTS PLATELET COUNT CONFIRMED BY  SMEAR DELTA CHECK NOTED   Glucose, capillary     Status: Abnormal   Collection Time: 02/12/16  7:43 AM  Result Value Ref Range   Glucose-Capillary 130 (H) 65 - 99 mg/dL   Comment 1 Notify RN    Comment 2 Document in Chart     ABGS  Recent Labs  02/12/16 0446  PHART 7.393  PO2ART 181*  HCO3 22.1   CULTURES Recent Results (from the past 240 hour(s))  MRSA PCR Screening     Status: Abnormal   Collection Time: 02/13/2016  8:30 PM  Result Value Ref Range Status   MRSA by PCR POSITIVE (A) NEGATIVE Final    Comment:  The GeneXpert MRSA Assay (FDA approved for NASAL specimens only), is one component of a comprehensive MRSA colonization surveillance program. It is not intended to diagnose MRSA infection nor to guide or monitor treatment for MRSA infections. RESULT CALLED TO, READ BACK BY AND VERIFIED WITH: ROBERTS,T ON 02/02/2016 AT 2255 BY LOY,C   Culture, blood (Routine X 2) w Reflex to ID Panel     Status: None (Preliminary result)   Collection Time: 02/09/16  8:24 AM  Result Value Ref Range Status   Specimen Description BLOOD LEFT HAND  Final   Special Requests BOTTLES DRAWN AEROBIC ONLY 6CC  Final   Culture NO GROWTH 2 DAYS  Final   Report Status PENDING  Incomplete  Culture, blood (Routine X 2) w Reflex to ID Panel     Status: None (Preliminary result)   Collection Time: 02/09/16  9:31 AM  Result Value Ref Range Status   Specimen Description BLOOD RIGHT HAND  Final   Special Requests BOTTLES DRAWN AEROBIC AND ANAEROBIC 6cc  Final   Culture NO GROWTH 2 DAYS  Final   Report Status PENDING  Incomplete   Studies/Results: Dg Abd 1 View  Result Date: 02/10/2016 CLINICAL DATA:  76 year old male are with abdominal distention and concern for small bowel obstruction. Decreased bowel sounds. EXAM: ABDOMEN - 1 VIEW COMPARISON:  None. FINDINGS: An enteric tube is partially visualized with tip in the proximal stomach. The side port of the enteric tube appears to be at the  level of the gastroesophageal junction. There is no bowel dilatation or evidence of obstruction. Air is noted within the stomach and colon. There is thickened appearance of the gastric wall which may be artifactual. Clinical correlation is recommended to evaluate for gastritis. No free air or radiopaque calculi identified. There is degenerative changes of the spine. No acute osseous pathology identified. IMPRESSION: No bowel dilatation or evidence of obstruction. Enteric tube with tip in the proximal stomach. Artifact versus less likely thickening of the gastric wall. Clinical correlation is recommended to evaluate for gastritis. Electronically Signed   By: Anner Crete M.D.   On: 02/10/2016 23:55   Dg Chest Port 1 View  Result Date: 02/12/2016 CLINICAL DATA:  Shortness of Breath EXAM: PORTABLE CHEST 1 VIEW COMPARISON:  02/11/2016 FINDINGS: There is hyperinflation of the lungs compatible with COPD. Endotracheal tube and NG tube are unchanged. No confluent opacities or effusions. Heart is normal size. IMPRESSION: COPD.  No acute findings. Electronically Signed   By: Rolm Baptise M.D.   On: 02/12/2016 07:52   Dg Chest Port 1 View  Result Date: 02/11/2016 CLINICAL DATA:  Short of breath. Ventilated patient. Subsequent encounter. EXAM: PORTABLE CHEST 1 VIEW COMPARISON:  02/10/2016 FINDINGS: Endotracheal tube and nasal/orogastric tube are stable. Lungs are hyperexpanded. There changes of emphysema with decrease markings in the peripheral lungs most evident on the left. No evidence of pneumonia or pulmonary edema. No pleural effusion or pneumothorax. IMPRESSION: 1. No acute findings in the lungs. 2. COPD. 3. Support apparatus is stable and well positioned. Electronically Signed   By: Lajean Manes M.D.   On: 02/11/2016 08:04    Medications:  Prior to Admission:  Prescriptions Prior to Admission  Medication Sig Dispense Refill Last Dose  . albuterol (PROVENTIL HFA;VENTOLIN HFA) 108 (90 Base) MCG/ACT  inhaler Inhale 1-2 puffs into the lungs every 6 (six) hours as needed for wheezing or shortness of breath. 1 Inhaler 0 Past Month at Unknown time  . ALPRAZolam (XANAX) 1 MG tablet  Take 1 tablet (1 mg total) by mouth 3 (three) times daily as needed for anxiety. 90 tablet 1 Past Month at Unknown time  . budesonide-formoterol (SYMBICORT) 160-4.5 MCG/ACT inhaler Inhale 2 puffs into the lungs 2 (two) times daily.   Past Month at Unknown time  . FLUoxetine (PROZAC) 40 MG capsule Take 1 capsule (40 mg total) by mouth daily. 30 capsule 3 Past Month at Unknown time  . Ivermectin 0.5 % LOTN Apply thoroughly to dry hair and scalp; leave lotion on hair for 10 minutes, and then rinse with water. 1 Tube 1 Past Month at Unknown time  . loratadine (CLARITIN) 10 MG tablet Take 1 tablet (10 mg total) by mouth daily. 30 tablet 11 Past Month at Unknown time  . omeprazole (PRILOSEC) 20 MG capsule take 1 capsule by mouth once daily 30 capsule 3 Past Month at Unknown time  . propranolol (INDERAL) 10 MG tablet take 1 tablet by mouth twice a day for TREMOR 60 tablet 3 Past Month at Unknown time  . rosuvastatin (CRESTOR) 10 MG tablet take 1 tablet by mouth once daily 30 tablet 3 Past Month at Unknown time  . tiotropium (SPIRIVA) 18 MCG inhalation capsule Place 18 mcg into inhaler and inhale daily.   Past Month at Unknown time  . albuterol (PROVENTIL) (2.5 MG/3ML) 0.083% nebulizer solution Take 3 mLs (2.5 mg total) by nebulization every 6 (six) hours as needed for wheezing or shortness of breath. 150 mL 1   . amLODipine (NORVASC) 10 MG tablet Take 10 mg by mouth daily.   10/27/2015 at Unknown time  . ibuprofen (ADVIL,MOTRIN) 200 MG tablet Take 200 mg by mouth every 6 (six) hours as needed for headache, mild pain or moderate pain. For pain    10/26/2015 at 1700  . nitroGLYCERIN (NITROSTAT) 0.4 MG SL tablet Place 0.4 mg under the tongue every 5 (five) minutes as needed for chest pain.   Past Month at Unknown time  . vitamin B-12  (CYANOCOBALAMIN) 1000 MCG tablet Take 1,000 mcg by mouth daily.   10/27/2015 at Unknown time  . [DISCONTINUED] predniSONE (DELTASONE) 20 MG tablet 2 tabs po daily x 4 days 8 tablet 0    Scheduled: . chlorhexidine gluconate (MEDLINE KIT)  15 mL Mouth Rinse BID  . Chlorhexidine Gluconate Cloth  6 each Topical Q0600  . enoxaparin (LOVENOX) injection  40 mg Subcutaneous Q24H  . famotidine (PEPCID) IV  20 mg Intravenous Q12H  . fentaNYL (SUBLIMAZE) injection  50 mcg Intravenous Once  . ipratropium-albuterol  3 mL Nebulization Q6H  . mouth rinse  15 mL Mouth Rinse QID  . methylPREDNISolone (SOLU-MEDROL) injection  125 mg Intravenous Q6H  . mupirocin ointment  1 application Nasal BID  . nicotine  21 mg Transdermal Daily  . piperacillin-tazobactam (ZOSYN)  IV  3.375 g Intravenous Q8H  . propofol  20 mg Intravenous Once  . propofol (DIPRIVAN) infusion  5-80 mcg/kg/min Intravenous Once  . vancomycin  1,000 mg Intravenous Q24H   Continuous: . sodium chloride 1,000 mL (02/12/16 0831)  . feeding supplement (VITAL AF 1.2 CAL)    . fentaNYL infusion INTRAVENOUS 250 mcg/hr (02/12/16 0600)  . propofol (DIPRIVAN) infusion 25 mcg/kg/min (02/12/16 0800)   DBP:[id number], albuterol, bisacodyl, fentaNYL, sodium chloride  Assesment:He was admitted with COPD exacerbation and acute on chronic hyper coccyx/hypercapnic respiratory failure. He is on the ventilator since admission essentially. He has abdominal distention but does not have obstruction. Chest x-ray that I personally reviewed does not show new  infiltrate. He has improved as far as his pH is concerned and some of that may be because his abdomen is somewhat less distended and some of it may be from getting Lasix yesterday. With severe lung disease as he has we may have difficulty seeing volume overload on chest x-ray Principal Problem:   COPD exacerbation (HCC) Active Problems:   COPD (chronic obstructive pulmonary disease) (HCC)   Essential  hypertension, benign   Respiratory failure (HCC)   Acute on chronic respiratory failure with hypercapnia (HCC)   Respiratory disorder with ventilator dependence ([place])   Fever   Acute bronchitis   Palliative care encounter   Goals of care, counseling/discussion   DNR (do not resuscitate) discussion    Plan: Repeat Lasix. Continue treatments. Discussed with wife at bedside and I told her that I estimate his chances of survival are something around 20%. He is likely to have significant disability.    LOS: 5 days   Chynna Buerkle L 02/12/2016, 9:40 AM

## 2016-02-12 NOTE — Progress Notes (Signed)
PROGRESS NOTE    LIEUTENANT ABARCA  NWG:[id number] DOB: [birth year] DOA: 01/28/2016 PCP: Vic Blackbird, MD     Brief Narrative:  76 year old man admitted to the hospital on 1/23 due to shortness of breath, was intubated in the emergency department. Continued difficulty with resp acidosis on vent. Abdominal distention but Xray without obstruction. Tube feeds have been held. 1/28: ABG improved. With significant increased volume and changes on CXR that may represent volume overload.   Assessment & Plan:   Principal Problem:   COPD exacerbation ([place]) Active Problems:   COPD (chronic obstructive pulmonary disease) ([place])   Essential hypertension, benign   Respiratory failure (HCC)   Acute on chronic respiratory failure with hypercapnia (HCC)   Respiratory disorder with ventilator dependence ([place])   Fever   Acute bronchitis   Palliative care encounter   Goals of care, counseling/discussion   DNR (do not resuscitate) discussion   Acute on chronic hypoxemic respiratory failure, ventilatory dependent -Presumed due to COPD exacerbation, was febrile on admission so unable to completely exclude community-acquired pneumonia and is being treated empirically with antibiotics, despite inconclusive chest x-ray. -Influenza PCR is negative. -Continue steroids for COPD exacerbation, nebs. -Appreciate Dr. Luan Pulling input and recommendations. -With volume overload will start on daily lasix 20 mg IV.  Elevated troponins -Likely due to demand ischemia in the face of ongoing severe respiratory illness. Trop with Jeremy high of 2.18. -ECHO: EF 57-84%, grade 1 diastolic dysfunction. -No plans for further aggressive cardiac workup at this point.  New-onset Jeremy. fib with RVR -Likely due to respiratory illness, rate is currently controlled.    DVT prophylaxis: Lovenox Code Status: Full code Family Communication: No family at bedside; discussed with son Alejos and Barrett Goldie from patient's prior marriage (not  sons of current wife) on 1/27. They understand the gravity of the situation and do not want their father to suffer. They will try to attend [place] meeting set for tomorrow at 2:30 pm. Disposition Plan: Remains intubated today  Consultants:   Pulmonary, Dr. Luan Pulling  Procedures:   None  Antimicrobials:  Anti-infectives    Start     Dose/Rate Route Frequency Ordered Stop   02/10/16 2200  vancomycin (VANCOCIN) IVPB 1000 mg/200 mL premix     1,000 mg 200 mL/hr over 60 Minutes Intravenous Every 24 hours 02/10/16 1220     02/09/16 2100  vancomycin (VANCOCIN) 500 mg in sodium chloride 0.9 % 100 mL IVPB  Status:  Discontinued     500 mg 100 mL/hr over 60 Minutes Intravenous Every 12 hours 02/09/16 0846 02/10/16 1219   02/09/16 0900  piperacillin-tazobactam (ZOSYN) IVPB 3.375 g     3.375 g 12.5 mL/hr over 240 Minutes Intravenous Every 8 hours 02/09/16 0846     02/09/16 0900  vancomycin (VANCOCIN) 1,250 mg in sodium chloride 0.9 % 250 mL IVPB     1,250 mg 166.7 mL/hr over 90 Minutes Intravenous  Once 02/09/16 0846 02/09/16 1108   02/08/16 2200  oseltamivir (TAMIFLU) capsule 75 mg  Status:  Discontinued     75 mg Oral 2 times daily 02/08/16 1653 02/08/16 1702   02/08/16 1715  oseltamivir (TAMIFLU) capsule 75 mg  Status:  Discontinued     75 mg Oral 2 times daily 02/08/16 1703 02/08/16 1703   02/08/16 1715  oseltamivir (TAMIFLU) capsule 30 mg  Status:  Discontinued     30 mg Oral 2 times daily 02/08/16 1703 02/09/16 0942   01/26/2016 2300  cefTRIAXone (ROCEPHIN) 1 g in dextrose  5 % 50 mL IVPB  Status:  Discontinued     1 g 100 mL/hr over 30 Minutes Intravenous Every 24 hours 02/05/2016 2100 02/09/16 0820   01/24/2016 2200  azithromycin (ZITHROMAX) 500 mg in dextrose 5 % 250 mL IVPB  Status:  Discontinued     500 mg 250 mL/hr over 60 Minutes Intravenous Every 24 hours 02/10/2016 2100 02/09/16 0820       Subjective: Profoundly sedated  Objective: Vitals:   02/12/16 0700 02/12/16 0814 02/12/16  0815 02/12/16 1000  BP: 112/75   (!) 147/85  Pulse:    92  Resp: (!) 25   (!) 25  Temp: 97.8 F (36.6 C)   97.9 F (36.6 C)  TempSrc: Core (Comment)   Core (Comment)  SpO2: 97% 98% 98% 100%  Weight:      Height:   _0  (1.727 m)     Intake/Output Summary (Last 24 hours) at 02/12/16 1017 Last data filed at 02/12/16 1000  Gross per 24 hour  Intake          3992.46 ml  Output             2100 ml  Net          1892.46 ml   Filed Weights   02/10/16 0600 02/11/16 0400 02/12/16 0500  Weight: 62.1 kg (136 lb 14.5 oz) 57.5 kg (126 lb 12.2 oz) 64.6 kg (142 lb 6.7 oz)    Examination:  General exam: Intubated, sedated Respiratory system: Clear to auscultation. Respiratory effort normal. Cardiovascular system:RRR. No murmurs, rubs, gallops. Gastrointestinal system: Abdomen is distended, soft and nontender. No organomegaly or masses felt. Normal bowel sounds heard. Central nervous system: Unable to fully assess as he is currently intubated, however moves all 4 spontaneously Extremities: No C/C/E, +pedal pulses Skin: No rashes, lesions or ulcers Psychiatry: Unable to assess as he is currently intubated    Data Reviewed: I have personally reviewed following labs and imaging studies  CBC:  Recent Labs Lab 01/22/2016 1559 02/08/16 0323 02/10/16 0530 02/11/16 0933 02/12/16 0734  WBC 7.2 4.9 6.5 7.1 4.7  NEUTROABS 6.0  --   --   --   --   HGB 15.7 12.8* 12.5* 13.6 13.7  HCT 48.7 39.2 39.5 44.4 42.7  MCV 98.4 98.5 100.5* 101.1* 98.4  PLT 154 152 182 65* 147*   Basic Metabolic Panel:  Recent Labs Lab 01/27/2016 1523 01/28/2016 1559 02/08/16 0323 02/09/16 0824 02/09/16 1703 02/10/16 0530 02/10/16 1645 02/11/16 0933  NA 143 142 144  --   --  142  --  144  K 6.3* 4.3 4.6  --   --  4.3  --  5.1  CL 106 104 111  --   --  113*  --  117*  CO2  --  30 24  --   --  23  --  20*  GLUCOSE 120* 104* 161*  --   --  172*  --  135*  BUN _1 --   --  49*  --  56*  CREATININE 0.80  0.91 1.06  --   --  1.58*  --  1.54*  CALCIUM  --  8.5* 7.5*  --   --  7.7*  --  7.8*  MG  --   --   --  2.1 2.0 2.3 2.3  --   PHOS  --   --   --  3.8 3.7 3.5 3.2  --  GFR: Estimated Creatinine Clearance: 37.9 mL/min (by C-G formula based on SCr of 1.54 mg/dL (H)). Liver Function Tests:  Recent Labs Lab 01/19/2016 1559  AST 24  ALT 10*  ALKPHOS 87  BILITOT 0.5  PROT 6.9  ALBUMIN 3.9   No results for input(s): LIPASE, AMYLASE in the last 168 hours. No results for input(s): AMMONIA in the last 168 hours. Coagulation Profile:  Recent Labs Lab 01/19/2016 1630  INR 1.02   Cardiac Enzymes:  Recent Labs Lab 02/09/16 0827 02/09/16 1500 02/09/16 1924  TROPONINI 0.74* 0.86* 2.18*   BNP (last 3 results) No results for input(s): PROBNP in the last 8760 hours. HbA1C: No results for input(s): HGBA1C in the last 72 hours. CBG:  Recent Labs Lab 02/11/16 1631 02/11/16 1929 02/12/16 0007 02/12/16 0422 02/12/16 0743  GLUCAP 120* 128* 159* 126* 130*   Lipid Profile:  Recent Labs  02/11/16 0903  TRIG 173*   Thyroid Function Tests: No results for input(s): TSH, T4TOTAL, FREET4, T3FREE, THYROIDAB in the last 72 hours. Anemia Panel: No results for input(s): VITAMINB12, FOLATE, FERRITIN, TIBC, IRON, RETICCTPCT in the last 72 hours. Urine analysis:    Component Value Date/Time   COLORURINE YELLOW 02/11/2016 1544   APPEARANCEUR HAZY ([place]) 01/28/2016 1544   LABSPEC 1.015 02/03/2016 1544   PHURINE 5.0 01/22/2016 1544   GLUCOSEU NEGATIVE 01/20/2016 1544   HGBUR SMALL ([place]) 02/14/2016 1544   BILIRUBINUR NEGATIVE 01/31/2016 1544   KETONESUR 20 ([place]) 02/01/2016 1544   PROTEINUR 100 ([place]) 02/02/2016 1544   UROBILINOGEN 0.2 04/19/2014 1946   NITRITE NEGATIVE 01/27/2016 1544   LEUKOCYTESUR NEGATIVE 02/14/2016 1544   Sepsis Labs: _0 (procalcitonin:4,lacticidven:4)  ) Recent Results (from the past 240 hour(s))  MRSA PCR Screening     Status: Abnormal   Collection Time:  02/05/2016  8:30 PM  Result Value Ref Range Status   MRSA by PCR POSITIVE ([place]) NEGATIVE Final    Comment:        The GeneXpert MRSA Assay (FDA approved for NASAL specimens only), is one component of Jeremy comprehensive MRSA colonization surveillance program. It is not intended to diagnose MRSA infection nor to guide or monitor treatment for MRSA infections. RESULT CALLED TO, READ BACK BY AND VERIFIED WITH: ROBERTS,T ON 01/31/2016 AT 2255 BY LOY,C   Culture, blood (Routine X 2) w Reflex to ID Panel     Status: None (Preliminary result)   Collection Time: 02/09/16  8:24 AM  Result Value Ref Range Status   Specimen Description BLOOD LEFT HAND  Final   Special Requests BOTTLES DRAWN AEROBIC ONLY 6CC  Final   Culture NO GROWTH 2 DAYS  Final   Report Status PENDING  Incomplete  Culture, blood (Routine X 2) w Reflex to ID Panel     Status: None (Preliminary result)   Collection Time: 02/09/16  9:31 AM  Result Value Ref Range Status   Specimen Description BLOOD RIGHT HAND  Final   Special Requests BOTTLES DRAWN AEROBIC AND ANAEROBIC 6cc  Final   Culture NO GROWTH 2 DAYS  Final   Report Status PENDING  Incomplete         Radiology Studies: Dg Abd 1 View  Result Date: 02/10/2016 CLINICAL DATA:  76 year old male are with abdominal distention and concern for small bowel obstruction. Decreased bowel sounds. EXAM: ABDOMEN - 1 VIEW COMPARISON:  None. FINDINGS: An enteric tube is partially visualized with tip in the proximal stomach. The side port of the enteric tube appears to be at the level  of the gastroesophageal junction. There is no bowel dilatation or evidence of obstruction. Air is noted within the stomach and colon. There is thickened appearance of the gastric wall which may be artifactual. Clinical correlation is recommended to evaluate for gastritis. No free air or radiopaque calculi identified. There is degenerative changes of the spine. No acute osseous pathology identified. IMPRESSION: No  bowel dilatation or evidence of obstruction. Enteric tube with tip in the proximal stomach. Artifact versus less likely thickening of the gastric wall. Clinical correlation is recommended to evaluate for gastritis. Electronically Signed   By: Anner Crete M.D.   On: 02/10/2016 23:55   Dg Chest Port 1 View  Result Date: 02/12/2016 CLINICAL DATA:  Shortness of Breath EXAM: PORTABLE CHEST 1 VIEW COMPARISON:  02/11/2016 FINDINGS: There is hyperinflation of the lungs compatible with COPD. Endotracheal tube and NG tube are unchanged. No confluent opacities or effusions. Heart is normal size. IMPRESSION: COPD.  No acute findings. Electronically Signed   By: Rolm Baptise M.D.   On: 02/12/2016 07:52   Dg Chest Port 1 View  Result Date: 02/11/2016 CLINICAL DATA:  Short of breath. Ventilated patient. Subsequent encounter. EXAM: PORTABLE CHEST 1 VIEW COMPARISON:  02/10/2016 FINDINGS: Endotracheal tube and nasal/orogastric tube are stable. Lungs are hyperexpanded. There changes of emphysema with decrease markings in the peripheral lungs most evident on the left. No evidence of pneumonia or pulmonary edema. No pleural effusion or pneumothorax. IMPRESSION: 1. No acute findings in the lungs. 2. COPD. 3. Support apparatus is stable and well positioned. Electronically Signed   By: Lajean Manes M.D.   On: 02/11/2016 08:04        Scheduled Meds: . chlorhexidine gluconate (MEDLINE KIT)  15 mL Mouth Rinse BID  . Chlorhexidine Gluconate Cloth  6 each Topical Q0600  . enoxaparin (LOVENOX) injection  40 mg Subcutaneous Q24H  . famotidine (PEPCID) IV  20 mg Intravenous Q12H  . fentaNYL (SUBLIMAZE) injection  50 mcg Intravenous Once  . furosemide  40 mg Intravenous Once  . ipratropium-albuterol  3 mL Nebulization Q6H  . mouth rinse  15 mL Mouth Rinse QID  . methylPREDNISolone (SOLU-MEDROL) injection  60 mg Intravenous Q6H  . mupirocin ointment  1 application Nasal BID  . nicotine  21 mg Transdermal Daily  .  piperacillin-tazobactam (ZOSYN)  IV  3.375 g Intravenous Q8H  . propofol  20 mg Intravenous Once  . propofol (DIPRIVAN) infusion  5-80 mcg/kg/min Intravenous Once  . vancomycin  1,000 mg Intravenous Q24H   Continuous Infusions: . sodium chloride 1,000 mL (02/12/16 0831)  . feeding supplement (VITAL AF 1.2 CAL)    . fentaNYL infusion INTRAVENOUS 250 mcg/hr (02/12/16 0600)  . propofol (DIPRIVAN) infusion 25 mcg/kg/min (02/12/16 0800)     LOS: 5 days    Critical care time spent: 45 minutes. Greater than 50% of this time was spent in direct contact with the patient coordinating care.     Lelon Frohlich, MD [facility] Pager [phone]  If 7PM-7AM, please contact night-coverage [email or web address] Password [facility] 02/12/2016, 10:17 AM

## 2016-02-13 ENCOUNTER — Inpatient Hospital Stay (HOSPITAL_COMMUNITY): Payer: Medicare HMO

## 2016-02-13 LAB — BLOOD GAS, ARTERIAL
ACID-BASE EXCESS: 1.1 mmol/L (ref 0.0–2.0)
Bicarbonate: 23.4 mmol/L (ref 20.0–28.0)
Drawn by: 317771
FIO2: 0.4
O2 Content: 40 L/min
O2 SAT: 98.8 %
PEEP/CPAP: 5 cmH2O
PH ART: 7.377 (ref 7.350–7.450)
PO2 ART: 172 mmHg — AB (ref 83.0–108.0)
RATE: 25 resp/min
VT: 550 mL
pCO2 arterial: 40.7 mmHg (ref 32.0–48.0)

## 2016-02-13 LAB — POCT I-STAT, CHEM 8
BUN: 10 mg/dL (ref 6–20)
CHLORIDE: 106 mmol/L (ref 101–111)
CREATININE: 0.8 mg/dL (ref 0.61–1.24)
Calcium, Ion: 1.1 mmol/L — ABNORMAL LOW (ref 1.15–1.40)
GLUCOSE: 120 mg/dL — AB (ref 65–99)
HCT: 49 % (ref 39.0–52.0)
HEMOGLOBIN: 16.7 g/dL (ref 13.0–17.0)
Potassium: 6.3 mmol/L (ref 3.5–5.1)
SODIUM: 143 mmol/L (ref 135–145)
TCO2: 33 mmol/L (ref 0–100)

## 2016-02-13 LAB — GLUCOSE, CAPILLARY
GLUCOSE-CAPILLARY: 107 mg/dL — AB (ref 65–99)
GLUCOSE-CAPILLARY: 129 mg/dL — AB (ref 65–99)
Glucose-Capillary: 115 mg/dL — ABNORMAL HIGH (ref 65–99)

## 2016-02-13 LAB — BASIC METABOLIC PANEL
Anion gap: 11 (ref 5–15)
BUN: 45 mg/dL — ABNORMAL HIGH (ref 6–20)
CHLORIDE: 118 mmol/L — AB (ref 101–111)
CO2: 20 mmol/L — ABNORMAL LOW (ref 22–32)
Calcium: 7.6 mg/dL — ABNORMAL LOW (ref 8.9–10.3)
Creatinine, Ser: 1.31 mg/dL — ABNORMAL HIGH (ref 0.61–1.24)
GFR, EST AFRICAN AMERICAN: 60 mL/min — AB (ref >60)
GFR, EST NON AFRICAN AMERICAN: 52 mL/min — AB (ref >60)
Glucose, Bld: 124 mg/dL — ABNORMAL HIGH (ref 65–99)
Potassium: 4.3 mmol/L (ref 3.5–5.1)
SODIUM: 149 mmol/L — AB (ref 135–145)

## 2016-02-13 MED ORDER — FENTANYL CITRATE (PF) 2500 MCG/50ML IJ SOLN
INTRAMUSCULAR | Status: AC
  Filled 2016-02-13: qty 50

## 2016-02-13 NOTE — Progress Notes (Signed)
Spoke with RN ab titirating sedation to attempt wean from ventilator. I attempted this morning at first rounds and patient was apneic. RN titrated sedation to half and the patients HR immediately increased to 130s. Patient then placed back on all sedation. RT unable to wean at this point due to these issues. Attempt will be made again later today or tomorrow.

## 2016-02-13 NOTE — Progress Notes (Signed)
Daily Progress Note   Patient Name: [name]       Date: 02/13/2016 DOB: 04-03-1940  Age: 76 y.o. MRN#: [id number] Attending Physician: Mikki Harbor* Primary Care Physician: Vic Blackbird, MD Admit Date: 01/16/2016  Reason for Consultation/Follow-up: Establishing goals of care and Psychosocial/spiritual support  Subjective: Morning meeting with son [name]. We talk about his father's chronic health concerns, and also his acute health problems. We talk about possible future outcomes and choices. I share my worries over [name] ability to extubated, and also his quality of life. [name] is in communication with his brothers and states that he is also worried, his goal is to speak with his father about what is important to him.  Family meeting later in the day.  Present are son [name]), son [name] ([name]), daughter [name], and [name]. We again talk about [name] chronic and acute health concerns. We go to my office for a family meeting, present also is respiratory therapist [name]. We share our worries about [name] ability to wean/extubate. We talk about his quality of life over the last year, and the declines that [name], his main caregiver, has seen. We talk about giving more days, continued weaning trials. Talk about the option of permanent trach,  what this would look like,  including rehab and possible/probable long-term care for trach dependent patients. We also talk about the possibility to extubate, see if he sinks or swims.  We also talk about his other chronic health concerns.   During our conversation [name] is tearful many times. He states that his father has come back from illness so many times, and that he was strong enough to walk to the ambulance to  come to the hospital. [name], talks about his father's quality of life and focusing on what would be important to his father, with his already decreased functional status. I share a diagram of the chronic chronic illness pathway. I share the my concern that most elderly people have functional decline after extended ICU stays. I encourage family that we are doing all we can for [name]. I recommend that we have another family meeting either Wednesday or Thursday afternoon. Family agrees.  Decision making is complicated at this time, wife [name] is also hospitalized in intensive care with COPD exacerbation.  Length of Stay: 6  Current Medications: Scheduled Meds:  . chlorhexidine gluconate (MEDLINE KIT)  15 mL Mouth Rinse BID  . Chlorhexidine Gluconate Cloth  6 each Topical Q0600  . enoxaparin (LOVENOX) injection  40 mg Subcutaneous Q24H  . famotidine (PEPCID) IV  20 mg Intravenous Q12H  . fentaNYL (SUBLIMAZE) injection  50 mcg Intravenous Once  . furosemide  20 mg Intravenous Daily  . ipratropium-albuterol  3 mL Nebulization Q6H  . mouth rinse  15 mL Mouth Rinse QID  . methylPREDNISolone (SOLU-MEDROL) injection  60 mg Intravenous Q6H  . mupirocin ointment  1 application Nasal BID  . nicotine  21 mg Transdermal Daily  . piperacillin-tazobactam (ZOSYN)  IV  3.375 g Intravenous Q8H  . propofol  20 mg Intravenous Once  . propofol (DIPRIVAN) infusion  5-80 mcg/kg/min Intravenous Once  . vancomycin  1,000 mg Intravenous Q24H    Continuous Infusions: . sodium chloride 75 mL/hr at 02/13/16 0700  . feeding supplement (VITAL AF 1.2 CAL)    . fentaNYL infusion INTRAVENOUS 200 mcg/hr (02/13/16 0931)  . propofol (DIPRIVAN) infusion 15 mcg/kg/min (02/13/16 1048)    PRN Meds: acetaminophen, albuterol, bisacodyl, fentaNYL, sodium chloride  Physical Exam  Constitutional: No distress.  Intubated/ventilated/sedated.  HENT:  Head: Normocephalic and atraumatic.  Cardiovascular: Normal rate  and regular rhythm.   Rate 110's to 130s at times  Pulmonary/Chest:  Intubated/ventilated  Abdominal: He exhibits no distension.  Somewhat firm abdomen  Musculoskeletal: He exhibits no edema.  Neurological:  Ventilated/sedated  Skin: Skin is warm and dry.  Nursing note and vitals reviewed.           Vital Signs: BP (!) 120/92   Pulse (!) 123   Temp 99 F (37.2 C)   Resp (!) 22   Ht 5' 8"  (1.727 m)   Wt 64.8 kg (142 lb 13.7 oz)   SpO2 100%   BMI 21.72 kg/m  SpO2: SpO2: 100 % O2 Device: O2 Device: Ventilator O2 Flow Rate: O2 Flow Rate (L/min): 40 L/min  Intake/output summary:  Intake/Output Summary (Last 24 hours) at 02/13/16 1424 Last data filed at 02/13/16 1305  [name] per 24 hour  Intake          2320.59 ml  Output             2850 ml  Net          -529.41 ml   LBM: Last BM Date: 02/12/16 (patient intubated) Baseline Weight: Weight: 59.9 kg (132 lb) Most recent weight: Weight: 64.8 kg (142 lb 13.7 oz)       Palliative Assessment/Data:    Flowsheet Rows   Flowsheet Row Most Recent Value  Intake Tab  Referral Department  Hospitalist  Unit at Time of Referral  ICU  Palliative Care Primary Diagnosis  Pulmonary  Date Notified  02/09/16  Palliative Care Type  New Palliative care  Reason for referral  Clarify Goals of Care  Date of Admission  02/06/2016  Date first seen by Palliative Care  02/09/16  # of days Palliative referral response time  0 Day(s)  # of days IP prior to Palliative referral  2  Clinical Assessment  Palliative Performance Scale Score  10%  Pain Max last 24 hours  Not able to report  Pain Min Last 24 hours  Not able to report  Dyspnea Max Last 24 Hours  Not able to report  Dyspnea Min Last 24 hours  Not able to report  Psychosocial & Spiritual Assessment  Palliative Care Outcomes  Patient/Family meeting held?  Yes  Who was at the meeting?  Wife, [name]  Palliative Care Outcomes  Provided psychosocial or spiritual support,  Clarified goals of care  Palliative Care follow-up planned  -- [Follow-up while at [place]      Patient Active Problem List   Diagnosis Date Noted  . Palliative care encounter   . Goals of care, counseling/discussion   . DNR (do not resuscitate) discussion   . Respiratory failure ([place]) 02/03/2016  . Acute on chronic respiratory failure with hypercapnia ([place]) 02/04/2016  . Respiratory disorder with ventilator dependence ([place]) 02/05/2016  . Fever 01/25/2016  . Acute bronchitis 02/15/2016  . 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 Tremor 07/17/2015  . GERD (gastroesophageal reflux disease) 03/21/2015  . Generalized weakness 04/20/2014  . Gait instability 04/20/2014  . Acute sinusitis 04/20/2014  . COPD exacerbation ([place]) 03/31/2014  . Chronic respiratory failure with hypoxia ([place]) 08/19/2013  . AK (actinic keratosis) 07/29/2013  . Atypical chest pain 07/29/2013  . Decreased hearing 11/03/2012  . Leg cramps 07/04/2012  . OA (osteoarthritis) 04/25/2012  . Impaired mobility 04/25/2012  . Insomnia 03/29/2012  . Tobacco abuse 04/05/2011  . COPD (chronic obstructive pulmonary disease) ([place]) 03/14/2011  . Essential hypertension, benign 03/14/2011  . Hyperlipidemia 03/14/2011  . PVD (peripheral vascular disease) ([place]) 03/14/2011  . Depression with anxiety 03/14/2011  . Joint pain 03/14/2011  . Skin cancer 03/14/2011    Palliative Care Assessment & Plan   Patient Profile: 76 y.o.malewith past medical history of COPD per wife intubated/ventilated 5 times, hypertension and high cholesterol, PVD, anxiety, arthritis, skin canceradmitted on 01/11/2018with acute on chronic ventilator dependent respiratory failure due to COPD exacerbation.   Assessment: Acute on chronic hypoxemic respiratory failure, ventilatory dependent; unable to wean at this point, continue ventilator support. Family meeting requested Again for Wednesday or Thursday of this week.  Functional decline;  wife states that Mr. Francesco Runner is dependent on her for help with bathing. Son Lavella Hammock today states that he has seen a decline in his father over the last year.  SNF for rehab would be appropriate if he qualifies.   Recommendations/Plan:  continue full ventilator support at this time, continue to treat the treatable. Continue code status and quality of life discussions.  Goals of Care and Additional Recommendations:  Limitations on Scope of Treatment: Full Scope Treatment  Code Status:    Code Status Orders        Start     Ordered   02/03/2016 2101  Full code  Continuous     02/04/2016 2100    Code Status History    Date Active Date Inactive Code Status Order ID Comments User Context   08/18/2013  3:55 PM 08/20/2013  7:03 PM Full Code [id number]  Kinnie Feil, MD Inpatient   04/05/2011  3:46 AM 04/07/2011  7:59 PM Full Code [id number]  Ardath Sax, RN Inpatient       Prognosis:   < 6 months would not be surprising based on functional decline, repeat intubations/COPD exacerbations, decreased functional status prior to this hospitalization.  Discharge Planning:  To Be Determined SNF for rehab would definitely be helpful.  Care plan was discussed with nursing staff, case manager, social worker, and Dr. Jerilee Hoh  Thank you for allowing the Palliative Medicine Team to assist in the care of this patient.   Time In: 0930 1430 Time Out: 1005 1515 Total Time 80 minutes Prolonged Time Billed  yes       Greater than 50%  of this time was spent counseling and coordinating care related to the above assessment and plan.  Drue Novel, NP  Please contact Palliative Medicine Team phone at [phone] for questions and concerns.

## 2016-02-13 NOTE — Progress Notes (Signed)
Nutrition Follow-up  INTERVENTION:  Tube feeding was discontinued on 1/26 at 1507. No protein provided over the weekend. Lipid calories at present rate 295 every 24 hr.   Per MD pt may attempt weaning today- RD will continue to follow for vent status, diet advancement and outcome of family meeting to discuss goals  NO BM documented since prior to admission   NUTRITION DIAGNOSIS:   Inadequate oral intake related to inability to eat as evidenced by NPO status.   GOAL:   Provide needs based on ASPEN/SCCM guidelines   MONITOR:   TF tolerance, Labs, Vent status, Weight trends  REASON FOR ASSESSMENT: consult to adjust/manage tube feeds       ASSESSMENT:   Patient presents with dyspnea and a fever. He has hx of tobacco abuse, COPD, HTN. Chart reviewed -no family present. His weight hx is stable over the past 1.5 years.  Patient remains intubated on ventilator support due to acute respiratory failure. Severe weight gain over the weekend- 2.7 kg.  MV: 13.4 L/min Temp (24hrs), Avg:99 F (37.2 C), Min:98.8 F (37.1 C), Max:99.4 F (37.4 C)  Propofol: 11.2 ml/hr (provides 295 kcal lipids every 24 hr at current rate)    Recent Labs Lab 02/09/16 1703 02/10/16 0530 02/10/16 1645 02/11/16 0933 02/12/16 1852 02/13/16 0553  NA  --  142  --  144 146* 149*  K  --  4.3  --  5.1 4.2 4.3  CL  --  113*  --  117* 115* 118*  CO2  --  23  --  20* 23 20*  BUN  --  49*  --  56* 49* 45*  CREATININE  --  1.58*  --  1.54* 1.45* 1.31*  CALCIUM  --  7.7*  --  7.8* 7.7* 7.6*  MG 2.0 2.3 2.3  --   --   --   PHOS 3.7 3.5 3.2  --   --   --   GLUCOSE  --  172*  --  135* 129* 124*    Diet Order:  Diet NPO time specified  Skin:  Reviewed, no issues  Last BM:  Prior to admission   Height:   Ht Readings from Last 1 Encounters:  02/12/16 5\' 8"  (1.727 m)    Weight:   Wt Readings from Last 1 Encounters:  02/13/16 142 lb 13.7 oz (64.8 kg)    Ideal Body Weight:  70 kg  BMI:  Body  mass index is 21.72 kg/m.  Re- assessed estimated Nutritional Needs:   Kcal:  1678  Protein:  93-111 gr  Fluid:  1.6 liters daily  EDUCATION NEEDS:   No education needs identified at this time  Colman Cater MS,RD,CSG,LDN Office: [phone] Pager: [phone]

## 2016-02-13 NOTE — Progress Notes (Signed)
Subjective: He remains intubated and on the ventilator. He has had substantial improvement in his blood gases. No other new changes. No events noted overnight.  Objective: Vital signs in last 24 hours: Temp:  [97.9 F (36.6 C)-99.4 F (37.4 C)] 98.9 F (37.2 C) (01/29 0713) Pulse Rate:  [62-124] 69 (01/29 0713) Resp:  [22-25] 25 (01/29 0713) BP: (100-149)/(71-99) 120/92 (01/29 0700) SpO2:  [98 %-100 %] 100 % (01/29 0713) FiO2 (%):  [40 %] 40 % (01/29 0700) Weight:  [64.8 kg (142 lb 13.7 oz)] 64.8 kg (142 lb 13.7 oz) (01/29 0530) Weight change: 0.2 kg (7.1 oz) Last BM Date: 02/12/16 (patient intubated)  Intake/Output from previous day: 01/28 0701 - 01/29 0700 In: 2757.8 [I.V.:2357.8; IV Piggyback:400] Out: 2850 [Urine:2750; Emesis/NG output:100]  PHYSICAL EXAM General appearance: Intubated sedated on mechanical ventilation Resp: rhonchi bilaterally Cardio: regular rate and rhythm, S1, S2 normal, no murmur, click, rub or gallop GI: His abdomen is now soft and not as distended Extremities: He has generalized puffiness and 1+ pretibial edema Skin warm and dry. Mucous membranes are moist  Lab Results:  Results for orders placed or performed during the hospital encounter of 02/04/2016 (from the past 48 hour(s))  Glucose, capillary     Status: Abnormal   Collection Time: 02/11/16  7:42 AM  Result Value Ref Range   Glucose-Capillary 142 (H) 65 - 99 mg/dL  Triglycerides     Status: Abnormal   Collection Time: 02/11/16  9:03 AM  Result Value Ref Range   Triglycerides 173 (H) <150 mg/dL  Blood gas, arterial     Status: Abnormal   Collection Time: 02/11/16  9:07 AM  Result Value Ref Range   FIO2 0.40    O2 Content 40.0 L/min   Delivery systems VENTILATOR    Mode PRESSURE REGULATED VOLUME CONTROL    VT 550 mL   LHR 18 resp/min   Peep/cpap 5.0 cm H20   pH, Arterial 7.198 (LL) 7.350 - 7.450    Comment: CRITICAL RESULT CALLED TO, READ BACK BY AND VERIFIED WITH: MYSTI MCDANIELS,RN  ON 02/11/2016 BY PEVIANY LAWSON,RRT AT 0915.    pCO2 arterial 60.0 (H) 32.0 - 48.0 mmHg   pO2, Arterial 141 (H) 83.0 - 108.0 mmHg   Bicarbonate 19.5 (L) 20.0 - 28.0 mmol/L   Acid-base deficit 4.5 (H) 0.0 - 2.0 mmol/L   O2 Saturation 98.4 %   Collection site LEFT RADIAL    Drawn by [id number]    Sample type ARTERIAL    Allens test (pass/fail) PASS PASS  Basic metabolic panel     Status: Abnormal   Collection Time: 02/11/16  9:33 AM  Result Value Ref Range   Sodium 144 135 - 145 mmol/L   Potassium 5.1 3.5 - 5.1 mmol/L   Chloride 117 (H) 101 - 111 mmol/L   CO2 20 (L) 22 - 32 mmol/L   Glucose, Bld 135 (H) 65 - 99 mg/dL   BUN 56 (H) 6 - 20 mg/dL   Creatinine, Ser 1.54 (H) 0.61 - 1.24 mg/dL   Calcium 7.8 (L) 8.9 - 10.3 mg/dL   GFR calc non Af Amer 42 (L) >60 mL/min   GFR calc Af Amer 49 (L) >60 mL/min    Comment: (NOTE) The eGFR has been calculated using the CKD EPI equation. This calculation has not been validated in all clinical situations. eGFR's persistently <60 mL/min signify possible Chronic Kidney Disease.    Anion gap 7 5 - 15  CBC  Status: Abnormal   Collection Time: 02/11/16  9:33 AM  Result Value Ref Range   WBC 7.1 4.0 - 10.5 K/uL   RBC 4.39 4.22 - 5.81 MIL/uL   Hemoglobin 13.6 13.0 - 17.0 g/dL   HCT 44.4 39.0 - 52.0 %   MCV 101.1 (H) 78.0 - 100.0 fL   MCH 31.0 26.0 - 34.0 pg   MCHC 30.6 30.0 - 36.0 g/dL   RDW 15.7 (H) 11.5 - 15.5 %   Platelets 65 (L) 150 - 400 K/uL    Comment: DELTA CHECK NOTED SPECIMEN CHECKED FOR CLOTS PLATELET COUNT CONFIRMED BY SMEAR   Glucose, capillary     Status: Abnormal   Collection Time: 02/11/16 12:27 PM  Result Value Ref Range   Glucose-Capillary 120 (H) 65 - 99 mg/dL  Glucose, capillary     Status: Abnormal   Collection Time: 02/11/16  4:31 PM  Result Value Ref Range   Glucose-Capillary 120 (H) 65 - 99 mg/dL   Comment 1 Notify RN    Comment 2 Document in Chart   Glucose, capillary     Status: Abnormal   Collection Time:  02/11/16  7:29 PM  Result Value Ref Range   Glucose-Capillary 128 (H) 65 - 99 mg/dL  Glucose, capillary     Status: Abnormal   Collection Time: 02/12/16 12:07 AM  Result Value Ref Range   Glucose-Capillary 159 (H) 65 - 99 mg/dL  Glucose, capillary     Status: Abnormal   Collection Time: 02/12/16  4:22 AM  Result Value Ref Range   Glucose-Capillary 126 (H) 65 - 99 mg/dL  Blood gas, arterial     Status: Abnormal   Collection Time: 02/12/16  4:46 AM  Result Value Ref Range   FIO2 0.40    O2 Content 40.0 L/min   Delivery systems VENTILATOR    Mode PRESSURE REGULATED VOLUME CONTROL    VT 550 mL   LHR 25.0 resp/min   Peep/cpap 5.0 cm H20   pH, Arterial 7.393 7.350 - 7.450   pCO2 arterial 35.2 32.0 - 48.0 mmHg   pO2, Arterial 181 (H) 83.0 - 108.0 mmHg   Bicarbonate 22.1 20.0 - 28.0 mmol/L   Acid-Base Excess 3.1 (H) 0.0 - 2.0 mmol/L   O2 Saturation 99.0 %   Collection site LEFT RADIAL    Drawn by [id number]    Sample type ARTERIAL DRAW    Allens test (pass/fail) PASS PASS  CBC     Status: Abnormal   Collection Time: 02/12/16  7:34 AM  Result Value Ref Range   WBC 4.7 4.0 - 10.5 K/uL   RBC 4.34 4.22 - 5.81 MIL/uL   Hemoglobin 13.7 13.0 - 17.0 g/dL   HCT 42.7 39.0 - 52.0 %   MCV 98.4 78.0 - 100.0 fL   MCH 31.6 26.0 - 34.0 pg   MCHC 32.1 30.0 - 36.0 g/dL   RDW 15.7 (H) 11.5 - 15.5 %   Platelets 131 (L) 150 - 400 K/uL    Comment: SPECIMEN CHECKED FOR CLOTS PLATELET COUNT CONFIRMED BY SMEAR DELTA CHECK NOTED   Glucose, capillary     Status: Abnormal   Collection Time: 02/12/16  7:43 AM  Result Value Ref Range   Glucose-Capillary 130 (H) 65 - 99 mg/dL   Comment 1 Notify RN    Comment 2 Document in Chart   Glucose, capillary     Status: Abnormal   Collection Time: 02/12/16 11:53 AM  Result Value Ref Range   Glucose-Capillary  123 (H) 65 - 99 mg/dL   Comment 1 Notify RN    Comment 2 Document in Chart   Basic metabolic panel     Status: Abnormal   Collection Time: 02/12/16  6:52  PM  Result Value Ref Range   Sodium 146 (H) 135 - 145 mmol/L   Potassium 4.2 3.5 - 5.1 mmol/L    Comment: DELTA CHECK NOTED   Chloride 115 (H) 101 - 111 mmol/L   CO2 23 22 - 32 mmol/L   Glucose, Bld 129 (H) 65 - 99 mg/dL   BUN 49 (H) 6 - 20 mg/dL   Creatinine, Ser 1.45 (H) 0.61 - 1.24 mg/dL   Calcium 7.7 (L) 8.9 - 10.3 mg/dL   GFR calc non Af Amer 46 (L) >60 mL/min   GFR calc Af Amer 53 (L) >60 mL/min    Comment: (NOTE) The eGFR has been calculated using the CKD EPI equation. This calculation has not been validated in all clinical situations. eGFR's persistently <60 mL/min signify possible Chronic Kidney Disease.    Anion gap 8 5 - 15  Glucose, capillary     Status: Abnormal   Collection Time: 02/12/16  7:27 PM  Result Value Ref Range   Glucose-Capillary 126 (H) 65 - 99 mg/dL  Glucose, capillary     Status: Abnormal   Collection Time: 02/13/16  4:36 AM  Result Value Ref Range   Glucose-Capillary 115 (H) 65 - 99 mg/dL  Blood gas, arterial     Status: Abnormal   Collection Time: 02/13/16  4:57 AM  Result Value Ref Range   FIO2 0.40    O2 Content 40.0 L/min   Delivery systems VENTILATOR    Mode PRESSURE REGULATED VOLUME CONTROL    VT 550 mL   LHR 25.0 resp/min   Peep/cpap 5.0 cm H20   pH, Arterial 7.377 7.350 - 7.450   pCO2 arterial 40.7 32.0 - 48.0 mmHg   pO2, Arterial 172 (H) 83.0 - 108.0 mmHg   Bicarbonate 23.4 20.0 - 28.0 mmol/L   Acid-Base Excess 1.1 0.0 - 2.0 mmol/L   O2 Saturation 98.8 %   Collection site RIGHT RADIAL    Drawn by [id number]    Sample type ARTERIAL DRAW    Allens test (pass/fail) PASS PASS  Basic metabolic panel     Status: Abnormal   Collection Time: 02/13/16  5:53 AM  Result Value Ref Range   Sodium 149 (H) 135 - 145 mmol/L   Potassium 4.3 3.5 - 5.1 mmol/L   Chloride 118 (H) 101 - 111 mmol/L   CO2 20 (L) 22 - 32 mmol/L   Glucose, Bld 124 (H) 65 - 99 mg/dL   BUN 45 (H) 6 - 20 mg/dL   Creatinine, Ser 1.31 (H) 0.61 - 1.24 mg/dL   Calcium 7.6  (L) 8.9 - 10.3 mg/dL   GFR calc non Af Amer 52 (L) >60 mL/min   GFR calc Af Amer 60 (L) >60 mL/min    Comment: (NOTE) The eGFR has been calculated using the CKD EPI equation. This calculation has not been validated in all clinical situations. eGFR's persistently <60 mL/min signify possible Chronic Kidney Disease.    Anion gap 11 5 - 15  Glucose, capillary     Status: Abnormal   Collection Time: 02/13/16  7:15 AM  Result Value Ref Range   Glucose-Capillary 107 (H) 65 - 99 mg/dL    ABGS  Recent Labs  02/13/16 0457  PHART 7.377  PO2ART 172*  HCO3 23.4   CULTURES Recent Results (from the past 240 hour(s))  MRSA PCR Screening     Status: Abnormal   Collection Time: 01/30/2016  8:30 PM  Result Value Ref Range Status   MRSA by PCR POSITIVE (A) NEGATIVE Final    Comment:        The GeneXpert MRSA Assay (FDA approved for NASAL specimens only), is one component of a comprehensive MRSA colonization surveillance program. It is not intended to diagnose MRSA infection nor to guide or monitor treatment for MRSA infections. RESULT CALLED TO, READ BACK BY AND VERIFIED WITH: ROBERTS,T ON 02/02/2016 AT 2255 BY LOY,C   Culture, blood (Routine X 2) w Reflex to ID Panel     Status: None (Preliminary result)   Collection Time: 02/09/16  8:24 AM  Result Value Ref Range Status   Specimen Description BLOOD LEFT HAND  Final   Special Requests BOTTLES DRAWN AEROBIC ONLY 6CC  Final   Culture NO GROWTH 3 DAYS  Final   Report Status PENDING  Incomplete  Culture, blood (Routine X 2) w Reflex to ID Panel     Status: None (Preliminary result)   Collection Time: 02/09/16  9:31 AM  Result Value Ref Range Status   Specimen Description BLOOD RIGHT HAND  Final   Special Requests BOTTLES DRAWN AEROBIC AND ANAEROBIC 6cc  Final   Culture NO GROWTH 3 DAYS  Final   Report Status PENDING  Incomplete   Studies/Results: Dg Chest Port 1 View  Result Date: 02/12/2016 CLINICAL DATA:  Shortness of Breath EXAM:  PORTABLE CHEST 1 VIEW COMPARISON:  02/11/2016 FINDINGS: There is hyperinflation of the lungs compatible with COPD. Endotracheal tube and NG tube are unchanged. No confluent opacities or effusions. Heart is normal size. IMPRESSION: COPD.  No acute findings. Electronically Signed   By: Rolm Baptise M.D.   On: 02/12/2016 07:52    Medications:  Prior to Admission:  Prescriptions Prior to Admission  Medication Sig Dispense Refill Last Dose  . albuterol (PROVENTIL HFA;VENTOLIN HFA) 108 (90 Base) MCG/ACT inhaler Inhale 1-2 puffs into the lungs every 6 (six) hours as needed for wheezing or shortness of breath. 1 Inhaler 0 Past Week at Unknown time  . albuterol (PROVENTIL) (2.5 MG/3ML) 0.083% nebulizer solution Take 3 mLs (2.5 mg total) by nebulization every 6 (six) hours as needed for wheezing or shortness of breath. 150 mL 1 Past Week at Unknown time  . ALPRAZolam (XANAX) 1 MG tablet Take 1 tablet (1 mg total) by mouth 3 (three) times daily as needed for anxiety. 90 tablet 1 Past Week at Unknown time  . amLODipine (NORVASC) 10 MG tablet Take 10 mg by mouth daily.   Past Week at Unknown time  . budesonide-formoterol (SYMBICORT) 160-4.5 MCG/ACT inhaler Inhale 2 puffs into the lungs 2 (two) times daily.   Past Week at Unknown time  . FLUoxetine (PROZAC) 40 MG capsule Take 1 capsule (40 mg total) by mouth daily. 30 capsule 3 Past Week at Unknown time  . ibuprofen (ADVIL,MOTRIN) 200 MG tablet Take 200 mg by mouth every 6 (six) hours as needed for headache, mild pain or moderate pain. For pain    Past Week at Unknown time  . Ivermectin 0.5 % LOTN Apply thoroughly to dry hair and scalp; leave lotion on hair for 10 minutes, and then rinse with water. 1 Tube 1 Past Week at Unknown time  . loratadine (CLARITIN) 10 MG tablet Take 1 tablet (10 mg total) by mouth daily. [place]  tablet 11 Past Month at Unknown time  . omeprazole (PRILOSEC) 20 MG capsule take 1 capsule by mouth once daily 30 capsule 3 Past Month at Unknown  time  . propranolol (INDERAL) 10 MG tablet take 1 tablet by mouth twice a day for TREMOR 60 tablet 3 Past Week at Unknown time  . rosuvastatin (CRESTOR) 10 MG tablet take 1 tablet by mouth once daily 30 tablet 3 Past Week at Unknown time  . tiotropium (SPIRIVA) 18 MCG inhalation capsule Place 18 mcg into inhaler and inhale daily.   Past Week at Unknown time  . vitamin B-12 (CYANOCOBALAMIN) 1000 MCG tablet Take 1,000 mcg by mouth daily.   Past Week at Unknown time  . nitroGLYCERIN (NITROSTAT) 0.4 MG SL tablet Place 0.4 mg under the tongue every 5 (five) minutes as needed for chest pain.   unknown  . [DISCONTINUED] predniSONE (DELTASONE) 20 MG tablet 2 tabs po daily x 4 days 8 tablet 0    Scheduled: . chlorhexidine gluconate (MEDLINE KIT)  15 mL Mouth Rinse BID  . Chlorhexidine Gluconate Cloth  6 each Topical Q0600  . enoxaparin (LOVENOX) injection  40 mg Subcutaneous Q24H  . famotidine (PEPCID) IV  20 mg Intravenous Q12H  . fentaNYL (SUBLIMAZE) injection  50 mcg Intravenous Once  . furosemide  20 mg Intravenous Daily  . ipratropium-albuterol  3 mL Nebulization Q6H  . mouth rinse  15 mL Mouth Rinse QID  . methylPREDNISolone (SOLU-MEDROL) injection  60 mg Intravenous Q6H  . mupirocin ointment  1 application Nasal BID  . nicotine  21 mg Transdermal Daily  . piperacillin-tazobactam (ZOSYN)  IV  3.375 g Intravenous Q8H  . propofol  20 mg Intravenous Once  . propofol (DIPRIVAN) infusion  5-80 mcg/kg/min Intravenous Once  . vancomycin  1,000 mg Intravenous Q24H   Continuous: . sodium chloride 75 mL/hr at 02/13/16 0700  . feeding supplement (VITAL AF 1.2 CAL)    . fentaNYL infusion INTRAVENOUS 300 mcg/hr (02/12/16 2330)  . propofol (DIPRIVAN) infusion 30 mcg/kg/min (02/13/16 0143)   JJK:[id number], albuterol, bisacodyl, fentaNYL, sodium chloride  Assesment:He has COPD exacerbation and acute on chronic hypoxic/hypercapnic respiratory failure. He was difficult to ventilate initially and now  has improved. He is on 40% his pH is better. He has poor prognosis overall and plans are to have family discussion regarding his situation. However he may be weanable now so I'm going to see if he can come off. Chest x-ray which I personally reviewed does not show an infiltrate Principal Problem:   COPD exacerbation (HCC) Active Problems:   COPD (chronic obstructive pulmonary disease) (HCC)   Essential hypertension, benign   Respiratory failure (HCC)   Acute on chronic respiratory failure with hypercapnia (HCC)   Respiratory disorder with ventilator dependence ([place])   Fever   Acute bronchitis   Palliative care encounter   Goals of care, counseling/discussion   DNR (do not resuscitate) discussion    Plan: Attempt weaning today. Continue other treatments including steroids antibiotics etc. I would probably wait 1 more day before we initiate tube feeding again    LOS: 6 days   Nafisa Olds L 02/13/2016, 7:30 AM

## 2016-02-13 NOTE — Progress Notes (Signed)
PROGRESS NOTE    [name]  KVQ:[id number] DOB: 1940-06-17 DOA: 01/19/2016 PCP: Vic Blackbird, MD     Brief Narrative:  76 year old man admitted to the hospital on 1/23 due to shortness of breath, was intubated in the emergency department. Continued difficulty with resp acidosis on vent. Abdominal distention but Xray without obstruction. Tube feeds have been held. 1/28: ABG improved. With significant increased volume and changes on CXR that may represent volume overload. 1/29: Unble to wean due to tachycardia when sedation decreased. Palliative care meeting on 1/29.   Assessment & Plan:   Principal Problem:   COPD exacerbation ([place]) Active Problems:   COPD (chronic obstructive pulmonary disease) ([place])   Essential hypertension, benign   Respiratory failure ([facility])   Acute on chronic respiratory failure with hypercapnia ([facility])   Respiratory disorder with ventilator dependence ([place])   Fever   Acute bronchitis   Palliative care encounter   Goals of care, counseling/discussion   DNR (do not resuscitate) discussion   Acute on chronic hypoxemic respiratory failure, ventilatory dependent -Presumed due to COPD exacerbation, was febrile on admission so unable to completely exclude community-acquired pneumonia and is being treated empirically with antibiotics, despite inconclusive chest x-ray. -Influenza PCR is negative. -Continue steroids for COPD exacerbation, nebs. -Appreciate Dr. Luan Pulling input and recommendations. -With volume overload will start on daily lasix 20 mg IV. -Very poor prognosis, unable to wean yet again, await results from palliative care consultation.  Elevated troponins -Likely due to demand ischemia in the face of ongoing severe respiratory illness. Trop with a high of 2.18. -ECHO: EF 64-33%, grade 1 diastolic dysfunction. -No plans for further aggressive cardiac workup at this point.  New-onset A. fib with RVR -Likely due to respiratory illness, rate is  currently controlled.    DVT prophylaxis: Lovenox Code Status: Full code Family Communication: ; discussed with son [name] and [name] from patient's prior marriage (not sons of current wife) on 1/27. They understand the gravity of the situation and do not want their father to suffer.  Disposition Plan: Remains intubated today  Consultants:   Pulmonary, Dr. Luan Pulling  Procedures:   None  Antimicrobials:  Anti-infectives    Start     Dose/Rate Route Frequency Ordered Stop   02/10/16 2200  vancomycin (VANCOCIN) IVPB 1000 mg/200 mL premix     1,000 mg 200 mL/hr over 60 Minutes Intravenous Every 24 hours 02/10/16 1220     02/09/16 2100  vancomycin (VANCOCIN) 500 mg in sodium chloride 0.9 % 100 mL IVPB  Status:  Discontinued     500 mg 100 mL/hr over 60 Minutes Intravenous Every 12 hours 02/09/16 0846 02/10/16 1219   02/09/16 0900  piperacillin-tazobactam (ZOSYN) IVPB 3.375 g     3.375 g 12.5 mL/hr over 240 Minutes Intravenous Every 8 hours 02/09/16 0846     02/09/16 0900  vancomycin (VANCOCIN) 1,250 mg in sodium chloride 0.9 % 250 mL IVPB     1,250 mg 166.7 mL/hr over 90 Minutes Intravenous  Once 02/09/16 0846 02/09/16 1108   02/08/16 2200  oseltamivir (TAMIFLU) capsule 75 mg  Status:  Discontinued     75 mg Oral 2 times daily 02/08/16 1653 02/08/16 1702   02/08/16 1715  oseltamivir (TAMIFLU) capsule 75 mg  Status:  Discontinued     75 mg Oral 2 times daily 02/08/16 1703 02/08/16 1703   02/08/16 1715  oseltamivir (TAMIFLU) capsule 30 mg  Status:  Discontinued     30 mg Oral 2 times daily 02/08/16  1703 02/09/16 0942   02/10/2016 2300  cefTRIAXone (ROCEPHIN) 1 g in dextrose 5 % 50 mL IVPB  Status:  Discontinued     1 g 100 mL/hr over 30 Minutes Intravenous Every 24 hours 02/01/2016 2100 02/09/16 0820   01/27/2016 2200  azithromycin (ZITHROMAX) 500 mg in dextrose 5 % 250 mL IVPB  Status:  Discontinued     500 mg 250 mL/hr over 60 Minutes Intravenous Every 24 hours 02/10/2016 2100  02/09/16 0820       Subjective: Profoundly sedated  Objective: Vitals:   02/13/16 1126 02/13/16 1352 02/13/16 1632 02/13/16 1645  BP:      Pulse:   (!) 118   Resp:   (!) 25   Temp:   99.1 F (37.3 C)   TempSrc:      SpO2: 100% 100% 100% 99%  Weight:      Height:        Intake/Output Summary (Last 24 hours) at 02/13/16 1708 Last data filed at 02/13/16 1524  Gross per 24 hour  Intake          3078.08 ml  Output             1200 ml  Net          1878.08 ml   Filed Weights   02/11/16 0400 02/12/16 0500 02/13/16 0530  Weight: 57.5 kg (126 lb 12.2 oz) 64.6 kg (142 lb 6.7 oz) 64.8 kg (142 lb 13.7 oz)    Examination:  General exam: Intubated, sedated Respiratory system: Clear to auscultation. Respiratory effort normal. Cardiovascular system:RRR. No murmurs, rubs, gallops. Gastrointestinal system: Abdomen is distended, soft and nontender. No organomegaly or masses felt. Normal bowel sounds heard. Central nervous system: Unable to fully assess as he is currently intubated, however moves all 4 spontaneously Extremities: No C/C/E, +pedal pulses Skin: No rashes, lesions or ulcers Psychiatry: Unable to assess as he is currently intubated    Data Reviewed: I have personally reviewed following labs and imaging studies  CBC:  Recent Labs Lab 01/23/2016 1559 02/08/16 0323 02/10/16 0530 02/11/16 0933 02/12/16 0734  WBC 7.2 4.9 6.5 7.1 4.7  NEUTROABS 6.0  --   --   --   --   HGB 15.7 12.8* 12.5* 13.6 13.7  HCT 48.7 39.2 39.5 44.4 42.7  MCV 98.4 98.5 100.5* 101.1* 98.4  PLT 154 152 182 65* 626*   Basic Metabolic Panel:  Recent Labs Lab 02/08/16 0323 02/09/16 0824 02/09/16 1703 02/10/16 0530 02/10/16 1645 02/11/16 0933 02/12/16 1852 02/13/16 0553  NA 144  --   --  142  --  144 146* 149*  K 4.6  --   --  4.3  --  5.1 4.2 4.3  CL 111  --   --  113*  --  117* 115* 118*  CO2 24  --   --  23  --  20* 23 20*  GLUCOSE 161*  --   --  172*  --  135* 129* 124*  BUN 15   --   --  49*  --  56* 49* 45*  CREATININE 1.06  --   --  1.58*  --  1.54* 1.45* 1.31*  CALCIUM 7.5*  --   --  7.7*  --  7.8* 7.7* 7.6*  MG  --  2.1 2.0 2.3 2.3  --   --   --   PHOS  --  3.8 3.7 3.5 3.2  --   --   --  GFR: Estimated Creatinine Clearance: 44.7 mL/min (by C-G formula based on SCr of 1.31 mg/dL (H)). Liver Function Tests:  Recent Labs Lab 02/03/2016 1559  AST 24  ALT 10*  ALKPHOS 87  BILITOT 0.5  PROT 6.9  ALBUMIN 3.9   No results for input(s): LIPASE, AMYLASE in the last 168 hours. No results for input(s): AMMONIA in the last 168 hours. Coagulation Profile:  Recent Labs Lab 01/17/2016 1630  INR 1.02   Cardiac Enzymes:  Recent Labs Lab 02/09/16 0827 02/09/16 1500 02/09/16 1924  TROPONINI 0.74* 0.86* 2.18*   BNP (last 3 results) No results for input(s): PROBNP in the last 8760 hours. HbA1C: No results for input(s): HGBA1C in the last 72 hours. CBG:  Recent Labs Lab 02/12/16 0743 02/12/16 1153 02/12/16 1927 02/13/16 0436 02/13/16 0715  GLUCAP 130* 123* 126* 115* 107*   Lipid Profile:  Recent Labs  02/11/16 0903  TRIG 173*   Thyroid Function Tests: No results for input(s): TSH, T4TOTAL, FREET4, T3FREE, THYROIDAB in the last 72 hours. Anemia Panel: No results for input(s): VITAMINB12, FOLATE, FERRITIN, TIBC, IRON, RETICCTPCT in the last 72 hours. Urine analysis:    Component Value Date/Time   COLORURINE YELLOW 02/06/2016 1544   APPEARANCEUR HAZY (A) 01/31/2016 1544   LABSPEC 1.015 01/30/2016 1544   PHURINE 5.0 01/21/2016 1544   GLUCOSEU NEGATIVE 01/22/2016 1544   HGBUR SMALL (A) 02/04/2016 1544   BILIRUBINUR NEGATIVE 02/13/2016 1544   KETONESUR 20 (A) 01/31/2016 1544   PROTEINUR 100 (A) 02/10/2016 1544   UROBILINOGEN 0.2 04/19/2014 1946   NITRITE NEGATIVE 02/11/2016 1544   LEUKOCYTESUR NEGATIVE 02/15/2016 1544   Sepsis Labs: '@LABRCNTIP'$ (procalcitonin:4,lacticidven:4)  ) Recent Results (from the past 240 hour(s))  MRSA PCR  Screening     Status: Abnormal   Collection Time: 01/22/2016  8:30 PM  Result Value Ref Range Status   MRSA by PCR POSITIVE (A) NEGATIVE Final    Comment:        The GeneXpert MRSA Assay (FDA approved for NASAL specimens only), is one component of a comprehensive MRSA colonization surveillance program. It is not intended to diagnose MRSA infection nor to guide or monitor treatment for MRSA infections. RESULT CALLED TO, READ BACK BY AND VERIFIED WITH: ROBERTS,T ON 02/06/2016 AT 2255 BY LOY,C   Culture, blood (Routine X 2) w Reflex to ID Panel     Status: None (Preliminary result)   Collection Time: 02/09/16  8:24 AM  Result Value Ref Range Status   Specimen Description BLOOD LEFT HAND  Final   Special Requests BOTTLES DRAWN AEROBIC ONLY 6CC  Final   Culture NO GROWTH 4 DAYS  Final   Report Status PENDING  Incomplete  Culture, blood (Routine X 2) w Reflex to ID Panel     Status: None (Preliminary result)   Collection Time: 02/09/16  9:31 AM  Result Value Ref Range Status   Specimen Description BLOOD RIGHT HAND  Final   Special Requests BOTTLES DRAWN AEROBIC AND ANAEROBIC 6cc  Final   Culture NO GROWTH 4 DAYS  Final   Report Status PENDING  Incomplete         Radiology Studies: Dg Chest Port 1 View  Result Date: 02/13/2016 CLINICAL DATA:  Follow-up chest x-ray, ventilated patient. History of COPD, acute on chronic respiratory failure current smoker. EXAM: PORTABLE CHEST 1 VIEW COMPARISON:  Portable chest x-ray of February 12, 2016 FINDINGS: The patient is rotated toward the right. The lungs are hyperinflated. There is no focal infiltrate. There is  a trace of blunting of the costophrenic angles. The lung markings are coarse in the right upper lobe. There is no pneumothorax nor pneumomediastinum. The heart and pulmonary vascularity are normal. The endotracheal tube tip lies 6 cm above the carina. The esophagogastric tube tip projects below the inferior margin of the image. IMPRESSION:  COPD, coarse right upper lobe interstitial markings may reflect subsegmental atelectasis or fibrosis. Trace bilateral pleural effusions. No alveolar pneumonia nor pulmonary edema. The support tubes are in reasonable position. Electronically Signed   By: David  Martinique M.D.   On: 02/13/2016 07:33   Dg Chest Port 1 View  Result Date: 02/12/2016 CLINICAL DATA:  Shortness of Breath EXAM: PORTABLE CHEST 1 VIEW COMPARISON:  02/11/2016 FINDINGS: There is hyperinflation of the lungs compatible with COPD. Endotracheal tube and NG tube are unchanged. No confluent opacities or effusions. Heart is normal size. IMPRESSION: COPD.  No acute findings. Electronically Signed   By: Rolm Baptise M.D.   On: 02/12/2016 07:52        Scheduled Meds: . chlorhexidine gluconate (MEDLINE KIT)  15 mL Mouth Rinse BID  . Chlorhexidine Gluconate Cloth  6 each Topical Q0600  . enoxaparin (LOVENOX) injection  40 mg Subcutaneous Q24H  . famotidine (PEPCID) IV  20 mg Intravenous Q12H  . fentaNYL (SUBLIMAZE) injection  50 mcg Intravenous Once  . furosemide  20 mg Intravenous Daily  . ipratropium-albuterol  3 mL Nebulization Q6H  . mouth rinse  15 mL Mouth Rinse QID  . methylPREDNISolone (SOLU-MEDROL) injection  60 mg Intravenous Q6H  . mupirocin ointment  1 application Nasal BID  . nicotine  21 mg Transdermal Daily  . piperacillin-tazobactam (ZOSYN)  IV  3.375 g Intravenous Q8H  . propofol  20 mg Intravenous Once  . propofol (DIPRIVAN) infusion  5-80 mcg/kg/min Intravenous Once  . vancomycin  1,000 mg Intravenous Q24H   Continuous Infusions: . sodium chloride 75 mL/hr at 02/13/16 1524  . feeding supplement (VITAL AF 1.2 CAL)    . fentaNYL infusion INTRAVENOUS 200 mcg/hr (02/13/16 0931)  . propofol (DIPRIVAN) infusion 30 mcg/kg/min (02/13/16 1524)     LOS: 6 days    Critical care time spent: 45 minutes. Greater than 50% of this time was spent in direct contact with the patient coordinating care.     Lelon Frohlich, MD [facility] Pager [phone]  If 7PM-7AM, please contact night-coverage [email or web address] Password [email or web address] 02/13/2016, 5:08 PM

## 2016-02-13 NOTE — Progress Notes (Signed)
Pharmacy Antibiotic Note  [name] is a 76 y.o. male admitted on 02/06/2016 with sepsis.  Pharmacy has been consulted for Vancomycin and zosyn dosing.  SCr rising, pt on vent in ICU  Plan:  Modify Vancomycin 1000mg  IV q24h Check trough at steady state Zosyn 3.375gm IV q8h, EID Monitor labs, renal fxn, progress and c/s Deescalate ABX when improved / appropriate.    Height: 5\' 8"  (172.7 cm) Weight: 142 lb 13.7 oz (64.8 kg) IBW/kg (Calculated) : 68.4  Temp (24hrs), Avg:99 F (37.2 C), Min:98.8 F (37.1 C), Max:99.4 F (37.4 C)   Recent Labs Lab 01/20/2016 1523 02/03/2016 1559 02/08/16 0323 02/10/16 0530 02/11/16 0933 02/12/16 0734 02/12/16 1852 02/13/16 0553  WBC  --  7.2 4.9 6.5 7.1 4.7  --   --   CREATININE 0.80 0.91 1.06 1.58* 1.54*  --  1.45* 1.31*  LATICACIDVEN 0.70  --  1.6  --   --   --   --   --     Estimated Creatinine Clearance: 44.7 mL/min (by C-G formula based on SCr of 1.31 mg/dL (H)).    No Known Allergies  Antimicrobials this admission: Ceftriaxone 1/23 >> 1/25 Azithromycin 1/23 >> 1/25 Vancomycin 1/25>> Zosyn 1/25 >>  Dose adjustments this admission: N/a  Microbiology results: 1/25 BCx: pending, ng x 4 days 1/23 MRSA PCR: positive  Thank you for allowing pharmacy to be a part of this patient's care.  Hart Robinsons, PharmD Clinical Pharmacist Pager:  [phone] 02/13/2016   02/13/2016 11:09 AM

## 2016-02-14 DIAGNOSIS — Z9911 Dependence on respirator [ventilator] status: Secondary | ICD-10-CM

## 2016-02-14 LAB — BLOOD GAS, ARTERIAL
Acid-Base Excess: 1.7 mmol/L (ref 0.0–2.0)
Bicarbonate: 22.5 mmol/L (ref 20.0–28.0)
Drawn by: 22223
FIO2: 40
LHR: 25 {breaths}/min
O2 SAT: 98 %
PEEP: 5 cmH2O
VT: 550 mL
pCO2 arterial: 44.9 mmHg (ref 32.0–48.0)
pH, Arterial: 7.335 — ABNORMAL LOW (ref 7.350–7.450)
pO2, Arterial: 134 mmHg — ABNORMAL HIGH (ref 83.0–108.0)

## 2016-02-14 LAB — CULTURE, BLOOD (ROUTINE X 2)
Culture: NO GROWTH
Culture: NO GROWTH

## 2016-02-14 LAB — GLUCOSE, CAPILLARY
GLUCOSE-CAPILLARY: 133 mg/dL — AB (ref 65–99)
GLUCOSE-CAPILLARY: 135 mg/dL — AB (ref 65–99)
GLUCOSE-CAPILLARY: 135 mg/dL — AB (ref 65–99)
GLUCOSE-CAPILLARY: 166 mg/dL — AB (ref 65–99)
Glucose-Capillary: 149 mg/dL — ABNORMAL HIGH (ref 65–99)

## 2016-02-14 NOTE — Plan of Care (Signed)
Problem: Safety: Goal: Ability to remain free from injury will improve Outcome: Progressing Patient in bed resting. Bed alarms on and bed in lowest position. Explained to family why we do this.

## 2016-02-14 NOTE — Progress Notes (Signed)
No weaning completed today due to sedation. The RN tried to titrate sedation and the patient immediately had tachycardia and hypertension. Weaning will be attempted again in the am.

## 2016-02-14 NOTE — Progress Notes (Signed)
PROGRESS NOTE    [name]  RXV:[id number] DOB: Jan 02, 1941 DOA: 02/03/2016 PCP: Jeremy Blackbird, MD     Brief Narrative:  76 year old man admitted to the hospital on 1/23 due to shortness of breath, was intubated in the emergency department. Continued difficulty with resp acidosis on vent. Abdominal distention but Xray without obstruction. Tube feeds have been held. 1/28: ABG improved. With significant increased volume and changes on CXR that may represent volume overload. Weaning attempts have been unsuccessful initially due to respiratory acidosis and now due to tachycardia every time sedation is decreased. Palliative care and myself have had multiple family discussions, some family members seem to be unrealistic with expectations. Patient has very poor prognosis.  Assessment & Plan:   Principal Problem:   COPD exacerbation ([place]) Active Problems:   COPD (chronic obstructive pulmonary disease) ([place])   Essential hypertension, benign   Respiratory failure (HCC)   Acute on chronic respiratory failure with hypercapnia (HCC)   Respiratory disorder with ventilator dependence ([place])   Fever   Acute bronchitis   Palliative care encounter   Goals of care, counseling/discussion   DNR (do not resuscitate) discussion   Ventilator dependence ([place])   Acute on chronic hypoxemic respiratory failure, ventilatory dependent -Presumed due to COPD exacerbation, was febrile on admission so unable to completely exclude community-acquired pneumonia and is being treated empirically with antibiotics, despite inconclusive chest x-ray. 1/31 marks 7 days of antibiotics, would recommend discontinuation at that time. -Influenza PCR is negative. -Continue steroids for COPD exacerbation, nebs. -Appreciate Dr. Luan [name] input and recommendations. -With volume overload will start on daily lasix 20 mg IV. -Very poor prognosis, unable to wean yet again. -Continue palliative efforts.  Elevated troponins -Likely  due to demand ischemia in the face of ongoing severe respiratory illness. Trop with a high of 2.18. -ECHO: EF 50-93%, grade 1 diastolic dysfunction. -No plans for further aggressive cardiac workup at this point.  New-onset A. fib with RVR -Likely due to respiratory illness, rate is currently controlled.    DVT prophylaxis: Lovenox Code Status: Full code Family Communication: ; discussed with son [name] and [name] from patient's prior marriage (not sons of current wife) on 1/27. They understand the gravity of the situation and do not want their father to suffer. Palliative care continues discussions with family members. Disposition Plan: Remains intubated today  Consultants:   Pulmonary, Dr. Luan [name]  Procedures:   None  Antimicrobials:  Anti-infectives    Start     Dose/Rate Route Frequency Ordered Stop   02/10/16 2200  vancomycin (VANCOCIN) IVPB 1000 mg/200 mL premix     1,000 mg 200 mL/hr over 60 Minutes Intravenous Every 24 hours 02/10/16 1220     02/09/16 2100  vancomycin (VANCOCIN) 500 mg in sodium chloride 0.9 % 100 mL IVPB  Status:  Discontinued     500 mg 100 mL/hr over 60 Minutes Intravenous Every 12 hours 02/09/16 0846 02/10/16 1219   02/09/16 0900  piperacillin-tazobactam (ZOSYN) IVPB 3.375 g     3.375 g 12.5 mL/hr over 240 Minutes Intravenous Every 8 hours 02/09/16 0846     02/09/16 0900  vancomycin (VANCOCIN) 1,250 mg in sodium chloride 0.9 % 250 mL IVPB     1,250 mg 166.7 mL/hr over 90 Minutes Intravenous  Once 02/09/16 0846 02/09/16 1108   02/08/16 2200  oseltamivir (TAMIFLU) capsule 75 mg  Status:  Discontinued     75 mg Oral 2 times daily 02/08/16 1653 02/08/16 1702   02/08/16 1715  oseltamivir (  TAMIFLU) capsule 75 mg  Status:  Discontinued     75 mg Oral 2 times daily 02/08/16 1703 02/08/16 1703   02/08/16 1715  oseltamivir (TAMIFLU) capsule 30 mg  Status:  Discontinued     30 mg Oral 2 times daily 02/08/16 1703 02/09/16 0942   02/01/2016 2300   cefTRIAXone (ROCEPHIN) 1 g in dextrose 5 % 50 mL IVPB  Status:  Discontinued     1 g 100 mL/hr over 30 Minutes Intravenous Every 24 hours 01/31/2016 2100 02/09/16 0820   02/04/2016 2200  azithromycin (ZITHROMAX) 500 mg in dextrose 5 % 250 mL IVPB  Status:  Discontinued     500 mg 250 mL/hr over 60 Minutes Intravenous Every 24 hours 02/03/2016 2100 02/09/16 0820       Subjective: Profoundly sedated  Objective: Vitals:   02/14/16 1225 02/14/16 1546 02/14/16 1606 02/14/16 1607  BP:      Pulse:   91   Resp:   (!) 25   Temp:   99.5 F (37.5 C)   TempSrc:      SpO2: 100% 100% 100% 100%  Weight:      Height:        Intake/Output Summary (Last 24 hours) at 02/14/16 1702 Last data filed at 02/14/16 1600  Gross per 24 hour  Intake          3160.52 ml  Output             3050 ml  Net           110.52 ml   Filed Weights   02/11/16 0400 02/12/16 0500 02/13/16 0530  Weight: 57.5 kg (126 lb 12.2 oz) 64.6 kg (142 lb 6.7 oz) 64.8 kg (142 lb 13.7 oz)    Examination:  General exam: Intubated, sedated Respiratory system: Coarse bilateral breath sounds Cardiovascular system:RRR. No murmurs, rubs, gallops. Gastrointestinal system: Abdomen is distended, soft and nontender. No organomegaly or masses felt. Normal bowel sounds heard. Central nervous system: Unable to fully assess as he is currently intubated, however moves all 4 spontaneously Extremities: No C/C/E, +pedal pulses Psychiatry: Unable to assess as he is currently intubated    Data Reviewed: I have personally reviewed following labs and imaging studies  CBC:  Recent Labs Lab 02/08/16 0323 02/10/16 0530 02/11/16 0933 02/12/16 0734  WBC 4.9 6.5 7.1 4.7  HGB 12.8* 12.5* 13.6 13.7  HCT 39.2 39.5 44.4 42.7  MCV 98.5 100.5* 101.1* 98.4  PLT 152 182 65* 976*   Basic Metabolic Panel:  Recent Labs Lab 02/08/16 0323 02/09/16 0824 02/09/16 1703 02/10/16 0530 02/10/16 1645 02/11/16 0933 02/12/16 1852 02/13/16 0553  NA  144  --   --  142  --  144 146* 149*  K 4.6  --   --  4.3  --  5.1 4.2 4.3  CL 111  --   --  113*  --  117* 115* 118*  CO2 24  --   --  23  --  20* 23 20*  GLUCOSE 161*  --   --  172*  --  135* 129* 124*  BUN 15  --   --  49*  --  56* 49* 45*  CREATININE 1.06  --   --  1.58*  --  1.54* 1.45* 1.31*  CALCIUM 7.5*  --   --  7.7*  --  7.8* 7.7* 7.6*  MG  --  2.1 2.0 2.3 2.3  --   --   --  PHOS  --  3.8 3.7 3.5 3.2  --   --   --    GFR: Estimated Creatinine Clearance: 44.7 mL/min (by C-G formula based on SCr of 1.31 mg/dL (H)). Liver Function Tests: No results for input(s): AST, ALT, ALKPHOS, BILITOT, PROT, ALBUMIN in the last 168 hours. No results for input(s): LIPASE, AMYLASE in the last 168 hours. No results for input(s): AMMONIA in the last 168 hours. Coagulation Profile: No results for input(s): INR, PROTIME in the last 168 hours. Cardiac Enzymes:  Recent Labs Lab 02/09/16 0827 02/09/16 1500 02/09/16 1924  TROPONINI 0.74* 0.86* 2.18*   BNP (last 3 results) No results for input(s): PROBNP in the last 8760 hours. HbA1C: No results for input(s): HGBA1C in the last 72 hours. CBG:  Recent Labs Lab 02/13/16 0715 02/13/16 1937 02/14/16 0042 02/14/16 0423 02/14/16 1620  GLUCAP 107* 129* 149* 133* 135*   Lipid Profile: No results for input(s): CHOL, HDL, LDLCALC, TRIG, CHOLHDL, LDLDIRECT in the last 72 hours. Thyroid Function Tests: No results for input(s): TSH, T4TOTAL, FREET4, T3FREE, THYROIDAB in the last 72 hours. Anemia Panel: No results for input(s): VITAMINB12, FOLATE, FERRITIN, TIBC, IRON, RETICCTPCT in the last 72 hours. Urine analysis:    Component Value Date/Time   COLORURINE YELLOW 01/27/2016 1544   APPEARANCEUR HAZY (A) 02/13/2016 1544   LABSPEC 1.015 01/26/2016 1544   PHURINE 5.0 02/10/2016 1544   GLUCOSEU NEGATIVE 01/22/2016 1544   HGBUR SMALL (A) 01/31/2016 1544   BILIRUBINUR NEGATIVE 01/25/2016 1544   KETONESUR 20 (A) 02/13/2016 1544   PROTEINUR  100 (A) 01/17/2016 1544   UROBILINOGEN 0.2 04/19/2014 1946   NITRITE NEGATIVE 02/05/2016 1544   LEUKOCYTESUR NEGATIVE 01/23/2016 1544   Sepsis Labs: '@LABRCNTIP'$ (procalcitonin:4,lacticidven:4)  ) Recent Results (from the past 240 hour(s))  MRSA PCR Screening     Status: Abnormal   Collection Time: 01/23/2016  8:30 PM  Result Value Ref Range Status   MRSA by PCR POSITIVE (A) NEGATIVE Final    Comment:        The GeneXpert MRSA Assay (FDA approved for NASAL specimens only), is one component of a comprehensive MRSA colonization surveillance program. It is not intended to diagnose MRSA infection nor to guide or monitor treatment for MRSA infections. RESULT CALLED TO, READ BACK BY AND VERIFIED WITH: ROBERTS,T ON 02/05/2016 AT 2255 BY LOY,C   Culture, blood (Routine X 2) w Reflex to ID Panel     Status: None   Collection Time: 02/09/16  8:24 AM  Result Value Ref Range Status   Specimen Description BLOOD LEFT HAND  Final   Special Requests BOTTLES DRAWN AEROBIC ONLY [place]  Final   Culture NO GROWTH 5 DAYS  Final   Report Status 02/14/2016 FINAL  Final  Culture, blood (Routine X 2) w Reflex to ID Panel     Status: None   Collection Time: 02/09/16  9:31 AM  Result Value Ref Range Status   Specimen Description BLOOD RIGHT HAND  Final   Special Requests BOTTLES DRAWN AEROBIC AND ANAEROBIC 6cc  Final   Culture NO GROWTH 5 DAYS  Final   Report Status 02/14/2016 FINAL  Final         Radiology Studies: Dg Chest Port 1 View  Result Date: 02/13/2016 CLINICAL DATA:  Follow-up chest x-ray, ventilated patient. History of COPD, acute on chronic respiratory failure current smoker. EXAM: PORTABLE CHEST 1 VIEW COMPARISON:  Portable chest x-ray of February 12, 2016 FINDINGS: The patient is rotated toward the right. The  lungs are hyperinflated. There is no focal infiltrate. There is a trace of blunting of the costophrenic angles. The lung markings are coarse in the right upper lobe. There is no  pneumothorax nor pneumomediastinum. The heart and pulmonary vascularity are normal. The endotracheal tube tip lies 6 cm above the carina. The esophagogastric tube tip projects below the inferior margin of the image. IMPRESSION: COPD, coarse right upper lobe interstitial markings may reflect subsegmental atelectasis or fibrosis. Trace bilateral pleural effusions. No alveolar pneumonia nor pulmonary edema. The support tubes are in reasonable position. Electronically Signed   By: David  Martinique M.D.   On: 02/13/2016 07:33        Scheduled Meds: . chlorhexidine gluconate (MEDLINE KIT)  15 mL Mouth Rinse BID  . enoxaparin (LOVENOX) injection  40 mg Subcutaneous Q24H  . famotidine (PEPCID) IV  20 mg Intravenous Q12H  . fentaNYL (SUBLIMAZE) injection  50 mcg Intravenous Once  . furosemide  20 mg Intravenous Daily  . ipratropium-albuterol  3 mL Nebulization Q6H  . mouth rinse  15 mL Mouth Rinse QID  . methylPREDNISolone (SOLU-MEDROL) injection  60 mg Intravenous Q6H  . nicotine  21 mg Transdermal Daily  . piperacillin-tazobactam (ZOSYN)  IV  3.375 g Intravenous Q8H  . propofol  20 mg Intravenous Once  . propofol (DIPRIVAN) infusion  5-80 mcg/kg/min Intravenous Once  . vancomycin  1,000 mg Intravenous Q24H   Continuous Infusions: . sodium chloride 75 mL/hr at 02/14/16 0422  . feeding supplement (VITAL AF 1.2 CAL)    . fentaNYL infusion INTRAVENOUS 200 mcg/hr (02/13/16 2234)  . propofol (DIPRIVAN) infusion 30.059 mcg/kg/min (02/14/16 1248)     LOS: 7 days    Critical care time spent: 45 minutes. Greater than 50% of this time was spent in direct contact with the patient coordinating care.     Lelon Frohlich, MD [facility] Pager [phone]  If 7PM-7AM, please contact night-coverage [email or web address] Password [email or web address] 02/14/2016, 5:02 PM

## 2016-02-14 NOTE — Progress Notes (Signed)
Daily Progress Note   Patient Name: [name]       Date: 02/14/2016 DOB: 10-25-1940  Age: 76 y.o. MRN#: [id number] Attending Physician: Mikki Harbor* Primary Care Physician: Vic Blackbird, MD Admit Date: 01/31/2016  Reason for Consultation/Follow-up: Establishing goals of care and Psychosocial/spiritual support  Subjective: Son, [facility]) requests call.  He states [name] has stated that "trach is off the choices".  I share that I have not spoken with [name] while she is hospitalized. That we are still on track for meeting Wednesday or Thursday.  He is to contact family to set date and time.   We discuss Mr. Peaster functional status prior to this admit, which has been poor.   We discuss the poor weaning attempt earlier today, but continued efforts.   Length of Stay: 7  Current Medications: Scheduled Meds:  . chlorhexidine gluconate (MEDLINE KIT)  15 mL Mouth Rinse BID  . enoxaparin (LOVENOX) injection  40 mg Subcutaneous Q24H  . famotidine (PEPCID) IV  20 mg Intravenous Q12H  . fentaNYL (SUBLIMAZE) injection  50 mcg Intravenous Once  . furosemide  20 mg Intravenous Daily  . ipratropium-albuterol  3 mL Nebulization Q6H  . mouth rinse  15 mL Mouth Rinse QID  . methylPREDNISolone (SOLU-MEDROL) injection  60 mg Intravenous Q6H  . nicotine  21 mg Transdermal Daily  . piperacillin-tazobactam (ZOSYN)  IV  3.375 g Intravenous Q8H  . propofol  20 mg Intravenous Once  . propofol (DIPRIVAN) infusion  5-80 mcg/kg/min Intravenous Once  . vancomycin  1,000 mg Intravenous Q24H    Continuous Infusions: . sodium chloride 75 mL/hr at 02/14/16 0422  . feeding supplement (VITAL AF 1.2 CAL)    . fentaNYL infusion INTRAVENOUS 200 mcg/hr (02/13/16 2234)  . propofol  (DIPRIVAN) infusion 30.059 mcg/kg/min (02/14/16 1248)    PRN Meds: acetaminophen, albuterol, bisacodyl, fentaNYL, sodium chloride  Physical Exam  Constitutional: No distress.  Intubated/ventilated/sedated. Acutely ill appearing.   HENT:  Head: Normocephalic and atraumatic.  Cardiovascular: Normal rate and regular rhythm.   Rate 110's at times.   Pulmonary/Chest:  Intubated/ventilated/sedated.   Abdominal: Soft. He exhibits no distension.  Musculoskeletal: He exhibits no edema.  Neurological:  Intubated/ventilated/sedated.   Skin: Skin is warm and dry.  Nursing note and vitals reviewed.  Vital Signs: BP 128/75   Pulse (!) 117   Temp 99.1 F (37.3 C)   Resp (!) 25   Ht 5\' 8"  (1.727 m)   Wt 64.8 kg (142 lb 13.7 oz)   SpO2 100%   BMI 21.72 kg/m  SpO2: SpO2: 100 % O2 Device: O2 Device: Ventilator O2 Flow Rate: O2 Flow Rate (L/min): 40 L/min  Intake/output summary:  Intake/Output Summary (Last 24 hours) at 02/14/16 1530 Last data filed at 02/14/16 1444  Gross per 24 hour  Intake          2293.52 ml  Output             3050 ml  Net          -756.48 ml   LBM: Last BM Date: 02/12/16 Baseline Weight: Weight: 59.9 kg (132 lb) Most recent weight: Weight: 64.8 kg (142 lb 13.7 oz)       Palliative Assessment/Data:    Flowsheet Rows   Flowsheet Row Most Recent Value  Intake Tab  Referral Department  Hospitalist  Unit at Time of Referral  ICU  Palliative Care Primary Diagnosis  Pulmonary  Date Notified  02/09/16  Palliative Care Type  New Palliative care  Reason for referral  Clarify Goals of Care  Date of Admission  01/21/2016  Date first seen by Palliative Care  02/09/16  # of days Palliative referral response time  0 Day(s)  # of days IP prior to Palliative referral  2  Clinical Assessment  Palliative Performance Scale Score  10%  Pain Max last 24 hours  Not able to report  Pain Min Last 24 hours  Not able to report  Dyspnea Max Last 24 Hours  Not able  to report  Dyspnea Min Last 24 hours  Not able to report  Psychosocial & Spiritual Assessment  Palliative Care Outcomes  Patient/Family meeting held?  Yes  Who was at the meeting?  Wife, Finnick Orosz  Palliative Care Outcomes  Provided psychosocial or spiritual support, Clarified goals of care  Palliative Care follow-up planned  -- [Follow-up while at [facility]]      Patient Active Problem List   Diagnosis Date Noted  . Palliative care encounter   . Goals of care, counseling/discussion   . DNR (do not resuscitate) discussion   . Respiratory failure (HCC) 01/20/2016  . Acute on chronic respiratory failure with hypercapnia (HCC) 02/13/2016  . Respiratory disorder with ventilator dependence (HCC) 01/18/2016  . Fever 01/27/2016  . Acute bronchitis 02/11/2016  . 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 Tremor 07/17/2015  . GERD (gastroesophageal reflux disease) 03/21/2015  . Generalized weakness 04/20/2014  . Gait instability 04/20/2014  . Acute sinusitis 04/20/2014  . COPD exacerbation (HCC) 03/31/2014  . Chronic respiratory failure with hypoxia (HCC) 08/19/2013  . AK (actinic keratosis) 07/29/2013  . Atypical chest pain 07/29/2013  . Decreased hearing 11/03/2012  . Leg cramps 07/04/2012  . OA (osteoarthritis) 04/25/2012  . Impaired mobility 04/25/2012  . Insomnia 03/29/2012  . Tobacco abuse 04/05/2011  . COPD (chronic obstructive pulmonary disease) (HCC) 03/14/2011  . Essential hypertension, benign 03/14/2011  . Hyperlipidemia 03/14/2011  . PVD (peripheral vascular disease) (HCC) 03/14/2011  . Depression with anxiety 03/14/2011  . Joint pain 03/14/2011  . Skin cancer 03/14/2011    Palliative Care Assessment & Plan   Patient Profile: 76 y.o.malewith past medical history of COPD per wife intubated/ventilated 5 times, hypertension and high cholesterol, PVD, anxiety, arthritis, skin canceradmitted on  01/19/2018with acute on chronic ventilator dependent  respiratory failure due to COPD exacerbation.   Assessment: Acute on chronic hypoxemic respiratory failure, ventilatory dependent; unable to wean at this point, continue ventilator support. Family meeting requested Again for Wednesday or Thursday of this week.  Functional decline; wife states that Mr. Francesco Runner is dependent on her for help with bathing. Son Lavella Hammock today states that he has seen a decline in his father over the last year.  SNF for rehab would be appropriate if he qualifies.   Recommendations/Plan:  continue full ventilator support at this time, continue to treat the treatable. Continue code status and quality of life discussions.  Goals of Care and Additional Recommendations:  Limitations on Scope of Treatment: Full Scope Treatment  Code Status:    Code Status Orders        Start     Ordered   02/01/2016 2101  Full code  Continuous     01/27/2016 2100    Code Status History    Date Active Date Inactive Code Status Order ID Comments User Context   08/18/2013  3:55 PM 08/20/2013  7:03 PM Full Code [id number]  Kinnie Feil, MD Inpatient   04/05/2011  3:46 AM 04/07/2011  7:59 PM Full Code [id number]  Ardath Sax, RN Inpatient       Prognosis:   < 6 months would not be surprising based on functional decline, repeat intubations/COPD exacerbations, decreased functional status prior to this hospitalization.  IF he is able to survive this hospitalization.   Discharge Planning:  To Be Determined  Care plan was discussed with nursing staff, case manager, social worker, and Dr. Jerilee Hoh  Thank you for allowing the Palliative Medicine Team to assist in the care of this patient.   Time In: 0638 Time Out: 1500 Total Time 15 minutes Prolonged Time Billed  no       Greater than 50%  of this time was spent counseling and coordinating care related to the above assessment and plan.  Drue Novel, NP  Please contact Palliative Medicine Team phone at [phone] for questions and  concerns.

## 2016-02-14 NOTE — Progress Notes (Signed)
Subjective: He remains intubated and on the ventilator. No new problems. His family met with palliative care yesterday and they have not been able to come to a consensus about what they want to do so far. He failed weaning yesterday  Objective: Vital signs in last 24 hours: Temp:  [98.4 F (36.9 C)-99.3 F (37.4 C)] 99.3 F (37.4 C) (01/30 0715) Pulse Rate:  [62-123] 95 (01/30 0715) Resp:  [22-25] 25 (01/30 0715) BP: (116-158)/(82-100) 142/91 (01/30 0600) SpO2:  [98 %-100 %] 100 % (01/30 0715) FiO2 (%):  [40 %] 40 % (01/30 0345) Weight change:  Last BM Date: 02/12/16  Intake/Output from previous day: 01/29 0701 - 01/30 0700 In: 2756 [I.V.:2256; IV Piggyback:500] Out: 1650 [Urine:1650]  PHYSICAL EXAM General appearance: Intubated sedated on mechanical ventilation Resp: rhonchi bilaterally Cardio: regular rate and rhythm, S1, S2 normal, no murmur, click, rub or gallop GI: soft, non-tender; bowel sounds normal; no masses,  no organomegaly Extremities: Trace if any edema Skin warm and dry. Mucous membranes mildly dry  Lab Results:  Results for orders placed or performed during the hospital encounter of 02/09/2016 (from the past 48 hour(s))  Glucose, capillary     Status: Abnormal   Collection Time: 02/12/16 11:53 AM  Result Value Ref Range   Glucose-Capillary 123 (H) 65 - 99 mg/dL   Comment 1 Notify RN    Comment 2 Document in Chart   Basic metabolic panel     Status: Abnormal   Collection Time: 02/12/16  6:52 PM  Result Value Ref Range   Sodium 146 (H) 135 - 145 mmol/L   Potassium 4.2 3.5 - 5.1 mmol/L    Comment: DELTA CHECK NOTED   Chloride 115 (H) 101 - 111 mmol/L   CO2 23 22 - 32 mmol/L   Glucose, Bld 129 (H) 65 - 99 mg/dL   BUN 49 (H) 6 - 20 mg/dL   Creatinine, Ser 1.45 (H) 0.61 - 1.24 mg/dL   Calcium 7.7 (L) 8.9 - 10.3 mg/dL   GFR calc non Af Amer 46 (L) >60 mL/min   GFR calc Af Amer 53 (L) >60 mL/min    Comment: (NOTE) The eGFR has been calculated using the CKD  EPI equation. This calculation has not been validated in all clinical situations. eGFR's persistently <60 mL/min signify possible Chronic Kidney Disease.    Anion gap 8 5 - 15  Glucose, capillary     Status: Abnormal   Collection Time: 02/12/16  7:27 PM  Result Value Ref Range   Glucose-Capillary 126 (H) 65 - 99 mg/dL  Glucose, capillary     Status: Abnormal   Collection Time: 02/13/16  4:36 AM  Result Value Ref Range   Glucose-Capillary 115 (H) 65 - 99 mg/dL  Blood gas, arterial     Status: Abnormal   Collection Time: 02/13/16  4:57 AM  Result Value Ref Range   FIO2 0.40    O2 Content 40.0 L/min   Delivery systems VENTILATOR    Mode PRESSURE REGULATED VOLUME CONTROL    VT 550 mL   LHR 25.0 resp/min   Peep/cpap 5.0 cm H20   pH, Arterial 7.377 7.350 - 7.450   pCO2 arterial 40.7 32.0 - 48.0 mmHg   pO2, Arterial 172 (H) 83.0 - 108.0 mmHg   Bicarbonate 23.4 20.0 - 28.0 mmol/L   Acid-Base Excess 1.1 0.0 - 2.0 mmol/L   O2 Saturation 98.8 %   Collection site RIGHT RADIAL    Drawn by [id number]  Sample type ARTERIAL DRAW    Allens test (pass/fail) PASS PASS  Basic metabolic panel     Status: Abnormal   Collection Time: 02/13/16  5:53 AM  Result Value Ref Range   Sodium 149 (H) 135 - 145 mmol/L   Potassium 4.3 3.5 - 5.1 mmol/L   Chloride 118 (H) 101 - 111 mmol/L   CO2 20 (L) 22 - 32 mmol/L   Glucose, Bld 124 (H) 65 - 99 mg/dL   BUN 45 (H) 6 - 20 mg/dL   Creatinine, Ser 1.31 (H) 0.61 - 1.24 mg/dL   Calcium 7.6 (L) 8.9 - 10.3 mg/dL   GFR calc non Af Amer 52 (L) >60 mL/min   GFR calc Af Amer 60 (L) >60 mL/min    Comment: (NOTE) The eGFR has been calculated using the CKD EPI equation. This calculation has not been validated in all clinical situations. eGFR's persistently <60 mL/min signify possible Chronic Kidney Disease.    Anion gap 11 5 - 15  Glucose, capillary     Status: Abnormal   Collection Time: 02/13/16  7:15 AM  Result Value Ref Range   Glucose-Capillary 107 (H) 65  - 99 mg/dL  Glucose, capillary     Status: Abnormal   Collection Time: 02/13/16  7:37 PM  Result Value Ref Range   Glucose-Capillary 129 (H) 65 - 99 mg/dL   Comment 1 Notify RN    Comment 2 Document in Chart   Glucose, capillary     Status: Abnormal   Collection Time: 02/14/16 12:42 AM  Result Value Ref Range   Glucose-Capillary 149 (H) 65 - 99 mg/dL   Comment 1 Notify RN   Glucose, capillary     Status: Abnormal   Collection Time: 02/14/16  4:23 AM  Result Value Ref Range   Glucose-Capillary 133 (H) 65 - 99 mg/dL   Comment 1 Notify RN   Blood gas, arterial     Status: Abnormal   Collection Time: 02/14/16  5:08 AM  Result Value Ref Range   FIO2 40.00    Delivery systems VENTILATOR    Mode PRESSURE REGULATED VOLUME CONTROL    VT 550 mL   LHR 25 resp/min   Peep/cpap 5.0 cm H20   pH, Arterial 7.335 (L) 7.350 - 7.450   pCO2 arterial 44.9 32.0 - 48.0 mmHg   pO2, Arterial 134.0 (H) 83.0 - 108.0 mmHg   Bicarbonate 22.5 20.0 - 28.0 mmol/L   Acid-Base Excess 1.7 0.0 - 2.0 mmol/L   O2 Saturation 98.0 %   Collection site RIGHT RADIAL    Drawn by [id number]    Sample type ARTERIAL    Allens test (pass/fail) PASS PASS    ABGS  Recent Labs  02/14/16 0508  PHART 7.335*  PO2ART 134.0*  HCO3 22.5   CULTURES Recent Results (from the past 240 hour(s))  MRSA PCR Screening     Status: Abnormal   Collection Time: 02/12/2016  8:30 PM  Result Value Ref Range Status   MRSA by PCR POSITIVE (A) NEGATIVE Final    Comment:        The GeneXpert MRSA Assay (FDA approved for NASAL specimens only), is one component of a comprehensive MRSA colonization surveillance program. It is not intended to diagnose MRSA infection nor to guide or monitor treatment for MRSA infections. RESULT CALLED TO, READ BACK BY AND VERIFIED WITH: ROBERTS,T ON 01/20/2016 AT 2255 BY LOY,C   Culture, blood (Routine X 2) w Reflex to ID Panel  Status: None   Collection Time: 02/09/16  8:24 AM  Result Value Ref Range  Status   Specimen Description BLOOD LEFT HAND  Final   Special Requests BOTTLES DRAWN AEROBIC ONLY 6CC  Final   Culture NO GROWTH 5 DAYS  Final   Report Status 02/14/2016 FINAL  Final  Culture, blood (Routine X 2) w Reflex to ID Panel     Status: None   Collection Time: 02/09/16  9:31 AM  Result Value Ref Range Status   Specimen Description BLOOD RIGHT HAND  Final   Special Requests BOTTLES DRAWN AEROBIC AND ANAEROBIC 6cc  Final   Culture NO GROWTH 5 DAYS  Final   Report Status 02/14/2016 FINAL  Final   Studies/Results: Dg Chest Port 1 View  Result Date: 02/13/2016 CLINICAL DATA:  Follow-up chest x-ray, ventilated patient. History of COPD, acute on chronic respiratory failure current smoker. EXAM: PORTABLE CHEST 1 VIEW COMPARISON:  Portable chest x-ray of February 12, 2016 FINDINGS: The patient is rotated toward the right. The lungs are hyperinflated. There is no focal infiltrate. There is a trace of blunting of the costophrenic angles. The lung markings are coarse in the right upper lobe. There is no pneumothorax nor pneumomediastinum. The heart and pulmonary vascularity are normal. The endotracheal tube tip lies 6 cm above the carina. The esophagogastric tube tip projects below the inferior margin of the image. IMPRESSION: COPD, coarse right upper lobe interstitial markings may reflect subsegmental atelectasis or fibrosis. Trace bilateral pleural effusions. No alveolar pneumonia nor pulmonary edema. The support tubes are in reasonable position. Electronically Signed   By: David  Martinique M.D.   On: 02/13/2016 07:33    Medications:  Prior to Admission:  Prescriptions Prior to Admission  Medication Sig Dispense Refill Last Dose  . albuterol (PROVENTIL HFA;VENTOLIN HFA) 108 (90 Base) MCG/ACT inhaler Inhale 1-2 puffs into the lungs every 6 (six) hours as needed for wheezing or shortness of breath. 1 Inhaler 0 Past Week at Unknown time  . albuterol (PROVENTIL) (2.5 MG/3ML) 0.083% nebulizer solution  Take 3 mLs (2.5 mg total) by nebulization every 6 (six) hours as needed for wheezing or shortness of breath. 150 mL 1 Past Week at Unknown time  . ALPRAZolam (XANAX) 1 MG tablet Take 1 tablet (1 mg total) by mouth 3 (three) times daily as needed for anxiety. 90 tablet 1 Past Week at Unknown time  . amLODipine (NORVASC) 10 MG tablet Take 10 mg by mouth daily.   Past Week at Unknown time  . budesonide-formoterol (SYMBICORT) 160-4.5 MCG/ACT inhaler Inhale 2 puffs into the lungs 2 (two) times daily.   Past Week at Unknown time  . FLUoxetine (PROZAC) 40 MG capsule Take 1 capsule (40 mg total) by mouth daily. 30 capsule 3 Past Week at Unknown time  . ibuprofen (ADVIL,MOTRIN) 200 MG tablet Take 200 mg by mouth every 6 (six) hours as needed for headache, mild pain or moderate pain. For pain    Past Week at Unknown time  . Ivermectin 0.5 % LOTN Apply thoroughly to dry hair and scalp; leave lotion on hair for 10 minutes, and then rinse with water. 1 Tube 1 Past Week at Unknown time  . loratadine (CLARITIN) 10 MG tablet Take 1 tablet (10 mg total) by mouth daily. 30 tablet 11 Past Month at Unknown time  . omeprazole (PRILOSEC) 20 MG capsule take 1 capsule by mouth once daily 30 capsule 3 Past Month at Unknown time  . propranolol (INDERAL) 10 MG tablet take  1 tablet by mouth twice a day for TREMOR 60 tablet 3 Past Week at Unknown time  . rosuvastatin (CRESTOR) 10 MG tablet take 1 tablet by mouth once daily 30 tablet 3 Past Week at Unknown time  . tiotropium (SPIRIVA) 18 MCG inhalation capsule Place 18 mcg into inhaler and inhale daily.   Past Week at Unknown time  . vitamin B-12 (CYANOCOBALAMIN) 1000 MCG tablet Take 1,000 mcg by mouth daily.   Past Week at Unknown time  . nitroGLYCERIN (NITROSTAT) 0.4 MG SL tablet Place 0.4 mg under the tongue every 5 (five) minutes as needed for chest pain.   unknown  . [DISCONTINUED] predniSONE (DELTASONE) 20 MG tablet 2 tabs po daily x 4 days 8 tablet 0    Scheduled: .  chlorhexidine gluconate (MEDLINE KIT)  15 mL Mouth Rinse BID  . enoxaparin (LOVENOX) injection  40 mg Subcutaneous Q24H  . famotidine (PEPCID) IV  20 mg Intravenous Q12H  . fentaNYL (SUBLIMAZE) injection  50 mcg Intravenous Once  . furosemide  20 mg Intravenous Daily  . ipratropium-albuterol  3 mL Nebulization Q6H  . mouth rinse  15 mL Mouth Rinse QID  . methylPREDNISolone (SOLU-MEDROL) injection  60 mg Intravenous Q6H  . nicotine  21 mg Transdermal Daily  . piperacillin-tazobactam (ZOSYN)  IV  3.375 g Intravenous Q8H  . propofol  20 mg Intravenous Once  . propofol (DIPRIVAN) infusion  5-80 mcg/kg/min Intravenous Once  . vancomycin  1,000 mg Intravenous Q24H   Continuous: . sodium chloride 75 mL/hr at 02/14/16 0422  . feeding supplement (VITAL AF 1.2 CAL)    . fentaNYL infusion INTRAVENOUS 200 mcg/hr (02/13/16 2234)  . propofol (DIPRIVAN) infusion 30 mcg/kg/min (02/14/16 0459)   AVW:[id number], albuterol, bisacodyl, fentaNYL, sodium chloride  Assesment:He was admitted with COPD exacerbation and acute on chronic hypoxic hypercapnic respiratory failure requiring ventilator support. He is improving. He's had trouble with his abdomen and tube feedings have been held. He's had Lasix. He failed weaning yesterday. We had a very difficult time getting his pH adjusted but that has finally been accomplished. Principal Problem:   COPD exacerbation ([place]) Active Problems:   COPD (chronic obstructive pulmonary disease) ([facility])   Essential hypertension, benign   Respiratory failure ([facility])   Acute on chronic respiratory failure with hypercapnia ([facility])   Respiratory disorder with ventilator dependence ([place])   Fever   Acute bronchitis   Palliative care encounter   Goals of care, counseling/discussion   DNR (do not resuscitate) discussion    Plan: Attempt weaning again today. Continue other treatments    LOS: 7 days   [name] L 02/14/2016, 8:34 AM

## 2016-02-15 DIAGNOSIS — R748 Abnormal levels of other serum enzymes: Secondary | ICD-10-CM

## 2016-02-15 DIAGNOSIS — R7989 Other specified abnormal findings of blood chemistry: Secondary | ICD-10-CM

## 2016-02-15 DIAGNOSIS — R778 Other specified abnormalities of plasma proteins: Secondary | ICD-10-CM

## 2016-02-15 DIAGNOSIS — J9602 Acute respiratory failure with hypercapnia: Secondary | ICD-10-CM

## 2016-02-15 DIAGNOSIS — J9601 Acute respiratory failure with hypoxia: Secondary | ICD-10-CM

## 2016-02-15 LAB — GLUCOSE, CAPILLARY
GLUCOSE-CAPILLARY: 186 mg/dL — AB (ref 65–99)
GLUCOSE-CAPILLARY: 169 mg/dL — AB (ref 65–99)
GLUCOSE-CAPILLARY: 190 mg/dL — AB (ref 65–99)
Glucose-Capillary: 128 mg/dL — ABNORMAL HIGH (ref 65–99)
Glucose-Capillary: 153 mg/dL — ABNORMAL HIGH (ref 65–99)

## 2016-02-15 LAB — BLOOD GAS, ARTERIAL
ALLENS TEST (PASS/FAIL): POSITIVE — AB
Acid-base deficit: 1.1 mmol/L (ref 0.0–2.0)
BICARBONATE: 23.6 mmol/L (ref 20.0–28.0)
DRAWN BY: 382351
FIO2: 40
MECHVT: 550 mL
O2 Saturation: 98.7 %
PATIENT TEMPERATURE: 37
PCO2 ART: 38.2 mmHg (ref 32.0–48.0)
PEEP: 5 cmH2O
PO2 ART: 160 mmHg — AB (ref 83.0–108.0)
RATE: 25 resp/min
pH, Arterial: 7.398 (ref 7.350–7.450)

## 2016-02-15 LAB — PROTEIN / CREATININE RATIO, URINE
Creatinine, Urine: <10 mg/dL
Total Protein, Urine: <6 mg/dL

## 2016-02-15 LAB — CBC
HEMATOCRIT: 41.9 % (ref 39.0–52.0)
HEMOGLOBIN: 13.8 g/dL (ref 13.0–17.0)
MCH: 31.5 pg (ref 26.0–34.0)
MCHC: 32.9 g/dL (ref 30.0–36.0)
MCV: 95.7 fL (ref 78.0–100.0)
PLATELETS: 159 10*3/uL (ref 150–400)
RBC: 4.38 MIL/uL (ref 4.22–5.81)
RDW: 15.3 % (ref 11.5–15.5)
WBC: 6.1 10*3/uL (ref 4.0–10.5)

## 2016-02-15 LAB — HEPATIC FUNCTION PANEL
ALT: 51 U/L (ref 17–63)
AST: 30 U/L (ref 15–41)
Albumin: 2.8 g/dL — ABNORMAL LOW (ref 3.5–5.0)
Alkaline Phosphatase: 37 U/L — ABNORMAL LOW (ref 38–126)
BILIRUBIN INDIRECT: 0.8 mg/dL (ref 0.3–0.9)
Bilirubin, Direct: 0.2 mg/dL (ref 0.1–0.5)
TOTAL PROTEIN: 5.1 g/dL — AB (ref 6.5–8.1)
Total Bilirubin: 1 mg/dL (ref 0.3–1.2)

## 2016-02-15 LAB — BASIC METABOLIC PANEL
Anion gap: 11 (ref 5–15)
BUN: 40 mg/dL — ABNORMAL HIGH (ref 6–20)
CALCIUM: 7.8 mg/dL — AB (ref 8.9–10.3)
CHLORIDE: 116 mmol/L — AB (ref 101–111)
CO2: 24 mmol/L (ref 22–32)
CREATININE: 1.23 mg/dL (ref 0.61–1.24)
GFR calc non Af Amer: 56 mL/min — ABNORMAL LOW (ref >60)
GLUCOSE: 141 mg/dL — AB (ref 65–99)
Potassium: 3.6 mmol/L (ref 3.5–5.1)
Sodium: 151 mmol/L — ABNORMAL HIGH (ref 135–145)

## 2016-02-15 LAB — PROCALCITONIN: Procalcitonin: <0.1 ng/mL

## 2016-02-15 LAB — TRIGLYCERIDES: Triglycerides: 317 mg/dL — ABNORMAL HIGH (ref <150)

## 2016-02-15 MED ORDER — FENTANYL CITRATE (PF) 2500 MCG/50ML IJ SOLN
INTRAMUSCULAR | Status: AC
  Filled 2016-02-15: qty 50

## 2016-02-15 MED ORDER — FUROSEMIDE 10 MG/ML IJ SOLN
40.0000 mg | Freq: Two times a day (BID) | INTRAMUSCULAR | Status: DC
  Administered 2016-02-15 – 2016-02-20 (×12): 40 mg via INTRAVENOUS
  Filled 2016-02-15 (×12): qty 4

## 2016-02-15 NOTE — Progress Notes (Signed)
PROGRESS NOTE  [name] PFX:[id number] DOB: [birth year] DOA: 01/29/2016 PCP: Vic Blackbird, MD  Brief History:  76 year old man admitted to the hospital on 1/23 due to shortness of breath, was intubated in the emergency department. Continued difficulty with resp acidosis on vent. Abdominal distention but Xray without obstruction. Tube feeds have been held. 1/28: ABG improved. With significant increased volume and changes on CXR that may represent volume overload. Weaning attempts have been unsuccessful initially due to respiratory acidosis and now due to tachycardia every time sedation is decreased. Palliative care and myself have had multiple family discussions, some family members seem to be unrealistic with expectations. Patient has very poor prognosis.  Pulmonary medicine continues to follow.   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 Luan Pulling input and recommendations. -has failed weaning attempts -appreciate Palliative medicine continued discussions  COPD Exacerbation -continue solumedrol -continue DuoNebs -appreciate Dr. Kathaleen Grinder followup  HCAP -check procalcitonin -plan to d/c abx after today's doses  Elevated troponins -Due to demand ischemia -02/10/2016 echo EF 55-60%, grade 1 daily, technically difficult -No plans for aggressive cardiac workup  Paroxsymal Afib--new onset -secondary to infection and respiratory failure -presently in sinus  Lower extremity edema -check albumin -urine protein/creatinine ratio -increase lasix per Dr. Luan Pulling -daily weights -Am BMP  FEN -agree with restart enteral feeds      Disposition Plan:   Remain in ICU  Family Communication:  No Family at bedside--Total time spent 35 minutes.  Greater than 50% spent face to face counseling and  coordinating care.   Consultants:  Dr. Luan Pulling  Code Status:  FULL  DVT Prophylaxis:  [place] Lovenox   Procedures: As Listed in Progress Note Above  Antibiotics:  Zosyn   02/09/2016>>>  Vancomycin  02/09/2016>>>    Subjective:  patient is intubated and sedated.  No events overnight.  No vomiting.  No diarrhea.  No resp distress.  Failed weaning again 1/30  Objective: Vitals:   02/15/16 0400 02/15/16 0500 02/15/16 0600 02/15/16 0720  BP: 124/82 128/88    Pulse: 77 72  70  Resp: (!) 25 (!) 25  20  Temp: 98.5 F (36.9 C) 98.4 F (36.9 C)  98.8 F (37.1 C)  TempSrc: Core (Comment)     SpO2: 100%   99%  Weight:   67.2 kg (148 lb 2.4 oz)   Height:        Intake/Output Summary (Last 24 hours) at 02/15/16 0756 Last data filed at 02/15/16 0600  Gross per 24 hour  Intake           2998.8 ml  Output             2050 ml  Net            948.8 ml   Weight change:  Exam:   General:  Pt is intubated, not in acute distress  HEENT: No icterus, No thrush, No neck mass, [place]/AT  Cardiovascular: RRR, S1/S2, no rubs, no gallops  Respiratory: bibasilar crackles, diminished breath sounds bilateral  Abdomen: Soft/+BS, non tender, non distended, no guarding  Extremities: 1 + LE  edema, No lymphangitis, No petechiae, No rashes, no synovitis   Data Reviewed: I have personally reviewed following labs and imaging studies Basic Metabolic Panel:  Recent Labs Lab 02/09/16 0824 02/09/16 1703 02/10/16 0530 02/10/16 1645 02/11/16 3299  02/12/16 1852 02/13/16 0553 02/15/16 0417  NA  --   --  142  --  144 146* 149* 151*  K  --   --  4.3  --  5.1 4.2 4.3 3.6  CL  --   --  113*  --  117* 115* 118* 116*  CO2  --   --  23  --  20* 23 20* 24  GLUCOSE  --   --  172*  --  135* 129* 124* 141*  BUN  --   --  49*  --  56* 49* 45* 40*  CREATININE  --   --  1.58*  --  1.54* 1.45* 1.31* 1.23  CALCIUM  --   --  7.7*  --  7.8* 7.7* 7.6* 7.8*  MG 2.1 2.0 2.3 2.3  --   --   --   --   PHOS 3.8  3.7 3.5 3.2  --   --   --   --    Liver Function Tests: No results for input(s): AST, ALT, ALKPHOS, BILITOT, PROT, ALBUMIN in the last 168 hours. No results for input(s): LIPASE, AMYLASE in the last 168 hours. No results for input(s): AMMONIA in the last 168 hours. Coagulation Profile: No results for input(s): INR, PROTIME in the last 168 hours. CBC:  Recent Labs Lab 02/10/16 0530 02/11/16 0933 02/12/16 0734 02/15/16 0417  WBC 6.5 7.1 4.7 6.1  HGB 12.5* 13.6 13.7 13.8  HCT 39.5 44.4 42.7 41.9  MCV 100.5* 101.1* 98.4 95.7  PLT 182 65* 131* 159   Cardiac Enzymes:  Recent Labs Lab 02/09/16 0827 02/09/16 1500 02/09/16 1924  TROPONINI 0.74* 0.86* 2.18*   BNP: Invalid input(s): POCBNP CBG:  Recent Labs Lab 02/14/16 1620 02/14/16 1934 02/14/16 2357 02/15/16 0402 02/15/16 0717  GLUCAP 135* 135* 166* 128* 153*   HbA1C: No results for input(s): HGBA1C in the last 72 hours. Urine analysis:    Component Value Date/Time   COLORURINE YELLOW 02/06/2016 1544   APPEARANCEUR HAZY (A) 01/24/2016 1544   LABSPEC 1.015 02/01/2016 1544   PHURINE 5.0 01/22/2016 1544   GLUCOSEU NEGATIVE 02/11/2016 1544   HGBUR SMALL (A) 02/14/2016 1544   BILIRUBINUR NEGATIVE 01/23/2016 1544   KETONESUR 20 (A) 02/01/2016 1544   PROTEINUR 100 (A) 02/01/2016 1544   UROBILINOGEN 0.2 04/19/2014 1946   NITRITE NEGATIVE 01/21/2016 1544   LEUKOCYTESUR NEGATIVE 02/08/2016 1544   Sepsis Labs: '@LABRCNTIP'$ (procalcitonin:4,lacticidven:4) ) Recent Results (from the past 240 hour(s))  MRSA PCR Screening     Status: Abnormal   Collection Time: 02/06/2016  8:30 PM  Result Value Ref Range Status   MRSA by PCR POSITIVE (A) NEGATIVE Final    Comment:        The GeneXpert MRSA Assay (FDA approved for NASAL specimens only), is one component of a comprehensive MRSA colonization surveillance program. It is not intended to diagnose MRSA infection nor to guide or monitor treatment for MRSA  infections. RESULT CALLED TO, READ BACK BY AND VERIFIED WITH: ROBERTS,T ON 02/08/2016 AT 2255 BY LOY,C   Culture, blood (Routine X 2) w Reflex to ID Panel     Status: None   Collection Time: 02/09/16  8:24 AM  Result Value Ref Range Status   Specimen Description BLOOD LEFT HAND  Final   Special Requests BOTTLES DRAWN AEROBIC ONLY [place]  Final   Culture NO GROWTH 5 DAYS  Final   Report Status 02/14/2016 FINAL  Final  Culture, blood (Routine X 2) w Reflex  to ID Panel     Status: None   Collection Time: 02/09/16  9:31 AM  Result Value Ref Range Status   Specimen Description BLOOD RIGHT HAND  Final   Special Requests BOTTLES DRAWN AEROBIC AND ANAEROBIC 6cc  Final   Culture NO GROWTH 5 DAYS  Final   Report Status 02/14/2016 FINAL  Final     Scheduled Meds: . chlorhexidine gluconate (MEDLINE KIT)  15 mL Mouth Rinse BID  . enoxaparin (LOVENOX) injection  40 mg Subcutaneous Q24H  . famotidine (PEPCID) IV  20 mg Intravenous Q12H  . fentaNYL (SUBLIMAZE) injection  50 mcg Intravenous Once  . furosemide  40 mg Intravenous Q12H  . ipratropium-albuterol  3 mL Nebulization Q6H  . mouth rinse  15 mL Mouth Rinse QID  . methylPREDNISolone (SOLU-MEDROL) injection  60 mg Intravenous Q6H  . nicotine  21 mg Transdermal Daily  . piperacillin-tazobactam (ZOSYN)  IV  3.375 g Intravenous Q8H  . propofol  20 mg Intravenous Once  . propofol (DIPRIVAN) infusion  5-80 mcg/kg/min Intravenous Once  . vancomycin  1,000 mg Intravenous Q24H   Continuous Infusions: . sodium chloride 75 mL/hr at 02/14/16 1719  . feeding supplement (VITAL AF 1.2 CAL)    . fentaNYL infusion INTRAVENOUS 200 mcg/hr (02/15/16 0115)  . propofol (DIPRIVAN) infusion 30 mcg/kg/min (02/15/16 0127)    Procedures/Studies: Dg Abd 1 View  Result Date: 02/10/2016 CLINICAL DATA:  76 year old male are with abdominal distention and concern for small bowel obstruction. Decreased bowel sounds. EXAM: ABDOMEN - 1 VIEW COMPARISON:  None. FINDINGS: An  enteric tube is partially visualized with tip in the proximal stomach. The side port of the enteric tube appears to be at the level of the gastroesophageal junction. There is no bowel dilatation or evidence of obstruction. Air is noted within the stomach and colon. There is thickened appearance of the gastric wall which may be artifactual. Clinical correlation is recommended to evaluate for gastritis. No free air or radiopaque calculi identified. There is degenerative changes of the spine. No acute osseous pathology identified. IMPRESSION: No bowel dilatation or evidence of obstruction. Enteric tube with tip in the proximal stomach. Artifact versus less likely thickening of the gastric wall. Clinical correlation is recommended to evaluate for gastritis. Electronically Signed   By: Anner Crete M.D.   On: 02/10/2016 23:55   Ct Head Wo Contrast  Result Date: 01/28/2016 CLINICAL DATA:  Unresponsive.  Possible unintentional overdose. EXAM: CT HEAD WITHOUT CONTRAST TECHNIQUE: Contiguous axial images were obtained from the base of the skull through the vertex without intravenous contrast. COMPARISON:  Head CT 04/19/2014 and MRI brain 04/19/2014 FINDINGS: Brain: Stable age related cerebral atrophy, ventriculomegaly and periventricular white matter disease. No extra-axial fluid collections are identified. No CT findings for acute hemispheric infarction or intracranial hemorrhage. No mass lesions. The brainstem and cerebellum are normal. Vascular: Stable vascular calcifications. No hyperdense vessels or obvious aneurysm. Skull: No skull fracture or bone lesions. Sinuses/Orbits: Extensive chronic paranasal sinus disease mainly involving the ethmoid air cells, left maxillary sinus and left half of the sphenoid sinus. The mastoid air cells and middle ear cavities are clear. The globes are intact. Other: No scalp lesions or scalp hematoma. IMPRESSION: 1. Age related cerebral atrophy, ventriculomegaly and periventricular  white matter disease, unchanged when compared to prior examination. 2. No acute intracranial findings or mass lesions. 3. Chronic sinus disease. Electronically Signed   By: Marijo Sanes M.D.   On: 01/31/2016 17:00   Dg Chest Port 1  View  Result Date: 02/13/2016 CLINICAL DATA:  Follow-up chest x-ray, ventilated patient. History of COPD, acute on chronic respiratory failure current smoker. EXAM: PORTABLE CHEST 1 VIEW COMPARISON:  Portable chest x-ray of February 12, 2016 FINDINGS: The patient is rotated toward the right. The lungs are hyperinflated. There is no focal infiltrate. There is a trace of blunting of the costophrenic angles. The lung markings are coarse in the right upper lobe. There is no pneumothorax nor pneumomediastinum. The heart and pulmonary vascularity are normal. The endotracheal tube tip lies 6 cm above the carina. The esophagogastric tube tip projects below the inferior margin of the image. IMPRESSION: COPD, coarse right upper lobe interstitial markings may reflect subsegmental atelectasis or fibrosis. Trace bilateral pleural effusions. No alveolar pneumonia nor pulmonary edema. The support tubes are in reasonable position. Electronically Signed   By: Sharnise Blough  [place] M.D.   On: 02/13/2016 07:33   Dg Chest Port 1 View  Result Date: 02/12/2016 CLINICAL DATA:  Shortness of Breath EXAM: PORTABLE CHEST 1 VIEW COMPARISON:  02/11/2016 FINDINGS: There is hyperinflation of the lungs compatible with COPD. Endotracheal tube and NG tube are unchanged. No confluent opacities or effusions. Heart is normal size. IMPRESSION: COPD.  No acute findings. Electronically Signed   By: Rolm Baptise M.D.   On: 02/12/2016 07:52   Dg Chest Port 1 View  Result Date: 02/11/2016 CLINICAL DATA:  Short of breath. Ventilated patient. Subsequent encounter. EXAM: PORTABLE CHEST 1 VIEW COMPARISON:  02/10/2016 FINDINGS: Endotracheal tube and nasal/orogastric tube are stable. Lungs are hyperexpanded. There changes of  emphysema with decrease markings in the peripheral lungs most evident on the left. No evidence of pneumonia or pulmonary edema. No pleural effusion or pneumothorax. IMPRESSION: 1. No acute findings in the lungs. 2. COPD. 3. Support apparatus is stable and well positioned. Electronically Signed   By: Lajean Manes M.D.   On: 02/11/2016 08:04   Dg Chest Port 1 View  Result Date: 02/10/2016 CLINICAL DATA:  Ventilator dependent EXAM: PORTABLE CHEST 1 VIEW COMPARISON:  02/09/2016 FINDINGS: Endotracheal tube and nasogastric catheter are again seen and stable. Cardiac shadow is within normal limits. Previously seen patchy changes in the right upper lobe have resolved in the interval. No new focal infiltrate is seen. No bony abnormality is noted. IMPRESSION: No acute abnormality seen. Tubes and lines as described. Electronically Signed   By: Inez Catalina M.D.   On: 02/10/2016 09:01   Dg Chest Port 1 View  Result Date: 02/09/2016 CLINICAL DATA:  Irregular heartbeat.  Chest pain . EXAM: PORTABLE CHEST 1 VIEW COMPARISON:  02/13/2016. FINDINGS: Endotracheal tube noted with tip 3 cm above the carina. NG tube noted with tip below left hemidiaphragm. Mild infiltrate in the right upper lobe. Questionable nodular density is noted in the right apex. Small bilateral pleural effusions. Apical pleural-parenchymal thickening and cystic changes again noted on the right. These changes are consistent with scarring. No pneumothorax. Heart size stable. IMPRESSION: 1.  Endotracheal tube and NG tube in good anatomic position. 2. Questionable nodule right upper lobe. Questionable right upper lobe infiltrate. Follow-up chest x-rays recommended to demonstrate clearing. Electronically Signed   By: Marcello Moores  Register   On: 02/09/2016 08:11   Dg Chest Port 1 View  Result Date: 02/02/2016 CLINICAL DATA:  Respiratory failure EXAM: PORTABLE CHEST 1 VIEW COMPARISON:  10/27/2015 FINDINGS: Endotracheal tube placed. Tip is 4.5 cm from the carina.  NG tube tip is beyond the gastroesophageal junction. Lungs are hyperaerated. New nodular density at the  right apex measures less than 1 cm. No pneumothorax. Small right pleural effusion. IMPRESSION: Endotracheal and NG tubes placed. Subcentimeter nodule at the right apex has developed. Small right pleural effusion. Electronically Signed   By: Marybelle Killings M.D.   On: 02/14/2016 16:09    Ranesha Val, DO  [facility] Pager [phone]  If 7PM-7AM, please contact night-coverage [email or web address] Password [email or web address] 02/15/2016, 7:56 AM   LOS: 8 days

## 2016-02-15 NOTE — Progress Notes (Addendum)
Daily Progress Note   Patient Name: [name]       Date: 02/15/2016 DOB: 09-Sep-1940  Age: 76 y.o. MRN#: [id number] Attending Physician: Jeremy Eva, MD Primary Care Physician: Jeremy Blackbird, MD Admit Date: 02/04/2016  Reason for Consultation/Follow-up: Disposition, Establishing goals of care and Psychosocial/spiritual support  Subjective: [name] is lying quietly in bed. He is intubated/ventilated/sedated. Present today at bedside are wife [name], sons [name] and [name]. We talk about his current lab results, we talk about his wean trial this morning (2 hours).  We go to my office for a family meeting. Son [name] asks about transfer to [facility] I share the that there must be an excepting physician [name], and they usually only take patience if they have needs that cannot be met in the current hospital. [name] says he understands, but that his father had a successful extubation in the past at [facility]. Family is not, at this point, requesting transfer.  We talk about [name] best and worst case scenario outcomes. We talk about what is important to [name], and keeping him at the center of decision-making. We again review a diagram of the chronic illness pathway. Wife [name], recognized as Optician, dispensing, states that [name] would not want a trach. She tells of a time he saw or heard of a friend who had such. She also tells a story of how, due to body image disturbance, he would not leave the house for 3 months until he had a skin cancer removed from his forehead.  I encourage family to give a few more days of weaning trials. I will contact [name] and Joyce Gross on Friday with an update. I share the importance of having a family meeting on Monday if [name]  has not improved enough for extubation. Chaplain [name] arrives later in the meeting, providing psychosocial and spiritual support.   Length of Stay: 8  Current Medications: Scheduled Meds:  . chlorhexidine gluconate (MEDLINE KIT)  15 mL Mouth Rinse BID  . enoxaparin (LOVENOX) injection  40 mg Subcutaneous Q24H  . famotidine (PEPCID) IV  20 mg Intravenous Q12H  . fentaNYL (SUBLIMAZE) injection  50 mcg Intravenous Once  . furosemide  40 mg Intravenous Q12H  . ipratropium-albuterol  3 mL Nebulization Q6H  . mouth rinse  15  mL Mouth Rinse QID  . methylPREDNISolone (SOLU-MEDROL) injection  60 mg Intravenous Q6H  . nicotine  21 mg Transdermal Daily  . piperacillin-tazobactam (ZOSYN)  IV  3.375 g Intravenous Q8H  . propofol  20 mg Intravenous Once  . propofol (DIPRIVAN) infusion  5-80 mcg/kg/min Intravenous Once  . vancomycin  1,000 mg Intravenous Q24H    Continuous Infusions: . feeding supplement (VITAL AF 1.2 CAL) 1,000 mL (02/15/16 0803)  . fentaNYL infusion INTRAVENOUS 200 mcg/hr (02/15/16 1336)  . propofol (DIPRIVAN) infusion 30.059 mcg/kg/min (02/15/16 0819)    PRN Meds: acetaminophen, albuterol, bisacodyl, fentaNYL, sodium chloride  Physical Exam  Constitutional: No distress.  Intubated , sedated  HENT:  Head: Normocephalic and atraumatic.  Cardiovascular: Normal rate and regular rhythm.   Pulmonary/Chest:  Intubated /ventilated  Abdominal: Soft. He exhibits no distension.  Musculoskeletal: He exhibits no edema.  Neurological:  ventilated/sedated  Skin: Skin is warm and dry.  Nursing note and vitals reviewed.           Vital Signs: BP 123/69   Pulse 69   Temp 99.4 F (37.4 C)   Resp 20   Ht 5' 8" (1.727 m)   Wt 67.2 kg (148 lb 2.4 oz)   SpO2 100%   BMI 22.53 kg/m  SpO2: SpO2: 100 % O2 Device: O2 Device: Ventilator O2 Flow Rate: O2 Flow Rate (L/min): 40 L/min  Intake/output summary:  Intake/Output Summary (Last 24 hours) at 02/15/16 1617 Last data filed  at 02/15/16 1336  Gross per 24 hour  Intake          2511.42 ml  Output             2200 ml  Net           311.42 ml   LBM: Last BM Date: 02/12/16 Baseline Weight: Weight: 59.9 kg (132 lb) Most recent weight: Weight: 67.2 kg (148 lb 2.4 oz)       Palliative Assessment/Data:    Flowsheet Rows   Flowsheet Row Most Recent Value  Intake Tab  Referral Department  Hospitalist  Unit at Time of Referral  ICU  Palliative Care Primary Diagnosis  Pulmonary  Date Notified  02/09/16  Palliative Care Type  New Palliative care  Reason for referral  Clarify Goals of Care  Date of Admission  01/20/2016  Date first seen by Palliative Care  02/09/16  # of days Palliative referral response time  0 Day(s)  # of days IP prior to Palliative referral  2  Clinical Assessment  Palliative Performance Scale Score  10%  Pain Max last 24 hours  Not able to report  Pain Min Last 24 hours  Not able to report  Dyspnea Max Last 24 Hours  Not able to report  Dyspnea Min Last 24 hours  Not able to report  Psychosocial & Spiritual Assessment  Palliative Care Outcomes  Patient/Family meeting held?  Yes  Who was at the meeting?  Wife, [name]  Palliative Care Outcomes  Provided psychosocial or spiritual support, Clarified goals of care  Palliative Care follow-up planned  -- [Follow-up while at [place]      Patient Active Problem List   Diagnosis Date Noted  . Acute respiratory failure with hypoxia and hypercarbia ([place]) 02/15/2016  . Elevated troponin 02/15/2016  . Ventilator dependence ([place])   . Palliative care encounter   . Goals of care, counseling/discussion   . DNR (do not resuscitate) discussion   . Respiratory failure ([place]) 01/21/2016  .  Acute on chronic respiratory failure with hypercapnia ([place]) 01/19/2016  . Respiratory disorder with ventilator dependence ([place]) 02/06/2016  . Fever 01/21/2016  . Acute bronchitis 01/30/2016  . 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 Tremor  07/17/2015  . GERD (gastroesophageal reflux disease) 03/21/2015  . Generalized weakness 04/20/2014  . Gait instability 04/20/2014  . Acute sinusitis 04/20/2014  . COPD exacerbation ([place]) 03/31/2014  . Chronic respiratory failure with hypoxia ([place]) 08/19/2013  . AK (actinic keratosis) 07/29/2013  . Atypical chest pain 07/29/2013  . Decreased hearing 11/03/2012  . Leg cramps 07/04/2012  . OA (osteoarthritis) 04/25/2012  . Impaired mobility 04/25/2012  . Insomnia 03/29/2012  . Tobacco abuse 04/05/2011  . COPD (chronic obstructive pulmonary disease) ([place]) 03/14/2011  . Essential hypertension, benign 03/14/2011  . Hyperlipidemia 03/14/2011  . PVD (peripheral vascular disease) ([place]) 03/14/2011  . Depression with anxiety 03/14/2011  . Joint pain 03/14/2011  . Skin cancer 03/14/2011    Palliative Care Assessment & Plan   Patient Profile: 76 y.o.malewith past medical history of COPD per wife intubated/ventilated 5 times, hypertension and high cholesterol, PVD, anxiety, arthritis, skin canceradmitted on 01/14/2018with acute on chronic ventilator dependent respiratory failure due to COPD exacerbation.   Assessment: Acute on chronic hypoxemic respiratory failure, ventilatory dependent; Ventilator weaning today for 2 hours, continue ventilator support. Family meeting today to discuss possible outcomes. At this point, continue full support, weaning efforts. Family meeting again Monday if no changes.  Functional decline; wife States that Mr. Teters has had functional decline over the last few months, his goal was to get out and exercise more, but he has not been able. Mr. Bur is dependent on her for help with bathing. Son Lavella Hammock today states that he has seen a decline in his father over the last year.  SNF for rehab would be appropriate if he qualifies/extubates.  Recommendations/Plan:  continue full ventilator support at this time, continue to treat the treatable. Continue quality of life  discussions.  Goals of Care and Additional Recommendations:  Limitations on Scope of Treatment: No chest compressions or defibrillation. ACLS medications/vasopressin is okay, continued ventilator support okay.  Code Status:    Code Status Orders        Start     Ordered   02/15/16 1604  Limited resuscitation (code)  Continuous    Question Answer Comment  In the event of cardiac or respiratory ARREST: Initiate Code Blue, Call Rapid Response No   In the event of cardiac or respiratory ARREST: Perform CPR No   In the event of cardiac or respiratory ARREST: Perform Intubation/Mechanical Ventilation Yes   In the event of cardiac or respiratory ARREST: Use NIPPV/BiPAp only if indicated Yes   In the event of cardiac or respiratory ARREST: Administer ACLS medications if indicated Yes   In the event of cardiac or respiratory ARREST: Perform Defibrillation or Cardioversion if indicated No      02/15/16 1604    Code Status History    Date Active Date Inactive Code Status Order ID Comments User Context   01/25/2016  9:00 PM 02/15/2016  4:04 PM Full Code [id number]  Roney Jaffe, MD Inpatient   08/18/2013  3:55 PM 08/20/2013  7:03 PM Full Code [id number]  Kinnie Feil, MD Inpatient   04/05/2011  3:46 AM 04/07/2011  7:59 PM Full Code [id number]  Ardath Sax, RN Inpatient       Prognosis:   < 6 months would not be surprising based on functional decline,  repeat intubations/COPD exacerbations, decreased functional status prior to this hospitalization.  Discharge Planning:  To Be Determined, based on outcomes  Care plan was discussed with nursing staff, case manager, social worker, and Dr. Carles Collet.   Thank you for allowing the Palliative Medicine Team to assist in the care of this patient.   Time In: 1410 Time Out: 1520 Total Time 70 minutes  Prolonged Time Billed  yes      Greater than 50%  of this time was spent counseling and coordinating care related to the above assessment and  plan.  Drue Novel, NP  Please contact Palliative Medicine Team phone at [phone] for questions and concerns.

## 2016-02-15 NOTE — Plan of Care (Signed)
Problem: Nutrition: Goal: Adequate nutrition will be maintained Outcome: Progressing Tube feeding restarted this morning per Dr. Luan Pulling. At this time patient is tolerating well.

## 2016-02-15 NOTE — Progress Notes (Signed)
Subjective: No new problems per his nurse last night. He remains intubated and on the ventilator. Very little gastric drainage now. He failed weaning yesterday again.  Objective: Vital signs in last 24 hours: Temp:  [98.4 F (36.9 C)-99.5 F (37.5 C)] 98.8 F (37.1 C) (01/31 0720) Pulse Rate:  [66-117] 70 (01/31 0720) Resp:  [19-25] 20 (01/31 0720) BP: (117-174)/(75-107) 128/88 (01/31 0500) SpO2:  [99 %-100 %] 99 % (01/31 0720) FiO2 (%):  [30 %-40 %] 30 % (01/31 0655) Weight:  [67.2 kg (148 lb 2.4 oz)] 67.2 kg (148 lb 2.4 oz) (01/31 0600) Weight change:  Last BM Date: 02/12/16  Intake/Output from previous day: 01/30 0701 - 01/31 0700 In: 2998.8 [I.V.:2548.8; IV Piggyback:450] Out: 2050 [Urine:2050]  PHYSICAL EXAM General appearance: Intubated and sedated on mechanical ventilation. He does open his eyes. Resp: rhonchi bilaterally Cardio: His heart is regular with an occasional early beat. No gallop. GI: soft, non-tender; bowel sounds normal; no masses,  no organomegaly Extremities: Still with edema of his legs. Skin warm and dry. Mucous membranes are moist  Lab Results:  Results for orders placed or performed during the hospital encounter of 01/17/2016 (from the past 48 hour(s))  Glucose, capillary     Status: Abnormal   Collection Time: 02/13/16  7:37 PM  Result Value Ref Range   Glucose-Capillary 129 (H) 65 - 99 mg/dL   Comment 1 Notify RN    Comment 2 Document in Chart   Glucose, capillary     Status: Abnormal   Collection Time: 02/14/16 12:42 AM  Result Value Ref Range   Glucose-Capillary 149 (H) 65 - 99 mg/dL   Comment 1 Notify RN   Glucose, capillary     Status: Abnormal   Collection Time: 02/14/16  4:23 AM  Result Value Ref Range   Glucose-Capillary 133 (H) 65 - 99 mg/dL   Comment 1 Notify RN   Blood gas, arterial     Status: Abnormal   Collection Time: 02/14/16  5:08 AM  Result Value Ref Range   FIO2 40.00    Delivery systems VENTILATOR    Mode PRESSURE  REGULATED VOLUME CONTROL    VT 550 mL   LHR 25 resp/min   Peep/cpap 5.0 cm H20   pH, Arterial 7.335 (L) 7.350 - 7.450   pCO2 arterial 44.9 32.0 - 48.0 mmHg   pO2, Arterial 134.0 (H) 83.0 - 108.0 mmHg   Bicarbonate 22.5 20.0 - 28.0 mmol/L   Acid-Base Excess 1.7 0.0 - 2.0 mmol/L   O2 Saturation 98.0 %   Collection site RIGHT RADIAL    Drawn by [id number]    Sample type ARTERIAL    Allens test (pass/fail) PASS PASS  Glucose, capillary     Status: Abnormal   Collection Time: 02/14/16  4:20 PM  Result Value Ref Range   Glucose-Capillary 135 (H) 65 - 99 mg/dL  Glucose, capillary     Status: Abnormal   Collection Time: 02/14/16  7:34 PM  Result Value Ref Range   Glucose-Capillary 135 (H) 65 - 99 mg/dL   Comment 1 Notify RN   Glucose, capillary     Status: Abnormal   Collection Time: 02/14/16 11:57 PM  Result Value Ref Range   Glucose-Capillary 166 (H) 65 - 99 mg/dL   Comment 1 Notify RN   Glucose, capillary     Status: Abnormal   Collection Time: 02/15/16  4:02 AM  Result Value Ref Range   Glucose-Capillary 128 (H) 65 - 99 mg/dL  Comment 1 Notify RN   Basic metabolic panel     Status: Abnormal   Collection Time: 02/15/16  4:17 AM  Result Value Ref Range   Sodium 151 (H) 135 - 145 mmol/L   Potassium 3.6 3.5 - 5.1 mmol/L   Chloride 116 (H) 101 - 111 mmol/L   CO2 24 22 - 32 mmol/L   Glucose, Bld 141 (H) 65 - 99 mg/dL   BUN 40 (H) 6 - 20 mg/dL   Creatinine, Ser 1.23 0.61 - 1.24 mg/dL   Calcium 7.8 (L) 8.9 - 10.3 mg/dL   GFR calc non Af Amer 56 (L) >60 mL/min   GFR calc Af Amer >60 >60 mL/min    Comment: (NOTE) The eGFR has been calculated using the CKD EPI equation. This calculation has not been validated in all clinical situations. eGFR's persistently <60 mL/min signify possible Chronic Kidney Disease.    Anion gap 11 5 - 15  CBC     Status: None   Collection Time: 02/15/16  4:17 AM  Result Value Ref Range   WBC 6.1 4.0 - 10.5 K/uL   RBC 4.38 4.22 - 5.81 MIL/uL    Hemoglobin 13.8 13.0 - 17.0 g/dL   HCT 41.9 39.0 - 52.0 %   MCV 95.7 78.0 - 100.0 fL   MCH 31.5 26.0 - 34.0 pg   MCHC 32.9 30.0 - 36.0 g/dL   RDW 15.3 11.5 - 15.5 %   Platelets 159 150 - 400 K/uL  Triglycerides     Status: Abnormal   Collection Time: 02/15/16  4:17 AM  Result Value Ref Range   Triglycerides 317 (H) <150 mg/dL  Blood gas, arterial     Status: Abnormal   Collection Time: 02/15/16  6:10 AM  Result Value Ref Range   FIO2 40.00    Delivery systems VENTILATOR    Mode PRESSURE REGULATED VOLUME CONTROL    VT 550 mL   LHR 25 resp/min   Peep/cpap 5.0 cm H20   pH, Arterial 7.398 7.350 - 7.450   pCO2 arterial 38.2 32.0 - 48.0 mmHg   pO2, Arterial 160 (H) 83.0 - 108.0 mmHg   Bicarbonate 23.6 20.0 - 28.0 mmol/L   Acid-base deficit 1.1 0.0 - 2.0 mmol/L   O2 Saturation 98.7 %   Patient temperature 37.0    Collection site RIGHT RADIAL    Drawn by [id number]    Sample type ARTERIAL DRAW    Allens test (pass/fail) POSITIVE (A) PASS    ABGS  Recent Labs  02/15/16 0610  PHART 7.398  PO2ART 160*  HCO3 23.6   CULTURES Recent Results (from the past 240 hour(s))  MRSA PCR Screening     Status: Abnormal   Collection Time: 01/25/2016  8:30 PM  Result Value Ref Range Status   MRSA by PCR POSITIVE (A) NEGATIVE Final    Comment:        The GeneXpert MRSA Assay (FDA approved for NASAL specimens only), is one component of a comprehensive MRSA colonization surveillance program. It is not intended to diagnose MRSA infection nor to guide or monitor treatment for MRSA infections. RESULT CALLED TO, READ BACK BY AND VERIFIED WITH: ROBERTS,T ON 01/22/2016 AT 2255 BY LOY,C   Culture, blood (Routine X 2) w Reflex to ID Panel     Status: None   Collection Time: 02/09/16  8:24 AM  Result Value Ref Range Status   Specimen Description BLOOD LEFT HAND  Final   Special Requests BOTTLES DRAWN AEROBIC  ONLY 6CC  Final   Culture NO GROWTH 5 DAYS  Final   Report Status 02/14/2016 FINAL  Final   Culture, blood (Routine X 2) w Reflex to ID Panel     Status: None   Collection Time: 02/09/16  9:31 AM  Result Value Ref Range Status   Specimen Description BLOOD RIGHT HAND  Final   Special Requests BOTTLES DRAWN AEROBIC AND ANAEROBIC 6cc  Final   Culture NO GROWTH 5 DAYS  Final   Report Status 02/14/2016 FINAL  Final   Studies/Results: No results found.  Medications:  Prior to Admission:  Prescriptions Prior to Admission  Medication Sig Dispense Refill Last Dose  . albuterol (PROVENTIL HFA;VENTOLIN HFA) 108 (90 Base) MCG/ACT inhaler Inhale 1-2 puffs into the lungs every 6 (six) hours as needed for wheezing or shortness of breath. 1 Inhaler 0 Past Week at Unknown time  . albuterol (PROVENTIL) (2.5 MG/3ML) 0.083% nebulizer solution Take 3 mLs (2.5 mg total) by nebulization every 6 (six) hours as needed for wheezing or shortness of breath. 150 mL 1 Past Week at Unknown time  . ALPRAZolam (XANAX) 1 MG tablet Take 1 tablet (1 mg total) by mouth 3 (three) times daily as needed for anxiety. 90 tablet 1 Past Week at Unknown time  . amLODipine (NORVASC) 10 MG tablet Take 10 mg by mouth daily.   Past Week at Unknown time  . budesonide-formoterol (SYMBICORT) 160-4.5 MCG/ACT inhaler Inhale 2 puffs into the lungs 2 (two) times daily.   Past Week at Unknown time  . FLUoxetine (PROZAC) 40 MG capsule Take 1 capsule (40 mg total) by mouth daily. 30 capsule 3 Past Week at Unknown time  . ibuprofen (ADVIL,MOTRIN) 200 MG tablet Take 200 mg by mouth every 6 (six) hours as needed for headache, mild pain or moderate pain. For pain    Past Week at Unknown time  . Ivermectin 0.5 % LOTN Apply thoroughly to dry hair and scalp; leave lotion on hair for 10 minutes, and then rinse with water. 1 Tube 1 Past Week at Unknown time  . loratadine (CLARITIN) 10 MG tablet Take 1 tablet (10 mg total) by mouth daily. 30 tablet 11 Past Month at Unknown time  . omeprazole (PRILOSEC) 20 MG capsule take 1 capsule by mouth once  daily 30 capsule 3 Past Month at Unknown time  . propranolol (INDERAL) 10 MG tablet take 1 tablet by mouth twice a day for TREMOR 60 tablet 3 Past Week at Unknown time  . rosuvastatin (CRESTOR) 10 MG tablet take 1 tablet by mouth once daily 30 tablet 3 Past Week at Unknown time  . tiotropium (SPIRIVA) 18 MCG inhalation capsule Place 18 mcg into inhaler and inhale daily.   Past Week at Unknown time  . vitamin B-12 (CYANOCOBALAMIN) 1000 MCG tablet Take 1,000 mcg by mouth daily.   Past Week at Unknown time  . nitroGLYCERIN (NITROSTAT) 0.4 MG SL tablet Place 0.4 mg under the tongue every 5 (five) minutes as needed for chest pain.   unknown  . [DISCONTINUED] predniSONE (DELTASONE) 20 MG tablet 2 tabs po daily x 4 days 8 tablet 0    Scheduled: . chlorhexidine gluconate (MEDLINE KIT)  15 mL Mouth Rinse BID  . enoxaparin (LOVENOX) injection  40 mg Subcutaneous Q24H  . famotidine (PEPCID) IV  20 mg Intravenous Q12H  . fentaNYL (SUBLIMAZE) injection  50 mcg Intravenous Once  . furosemide  20 mg Intravenous Daily  . ipratropium-albuterol  3 mL Nebulization Q6H  .  mouth rinse  15 mL Mouth Rinse QID  . methylPREDNISolone (SOLU-MEDROL) injection  60 mg Intravenous Q6H  . nicotine  21 mg Transdermal Daily  . piperacillin-tazobactam (ZOSYN)  IV  3.375 g Intravenous Q8H  . propofol  20 mg Intravenous Once  . propofol (DIPRIVAN) infusion  5-80 mcg/kg/min Intravenous Once  . vancomycin  1,000 mg Intravenous Q24H   Continuous: . sodium chloride 75 mL/hr at 02/14/16 1719  . feeding supplement (VITAL AF 1.2 CAL)    . fentaNYL infusion INTRAVENOUS 200 mcg/hr (02/15/16 0115)  . propofol (DIPRIVAN) infusion 30 mcg/kg/min (02/15/16 0127)   NZV:[id number], albuterol, bisacodyl, fentaNYL, sodium chloride  Assesment:He was admitted with COPD exacerbation acute on chronic hypoxic and hypercapnic respiratory failure requiring ventilator support. He has remained on the ventilator since admission. He was started on  tube feedings and then developed abdominal distention so this is been discontinued but I think we need to restart. He continues to have generalized puffiness of his extremities and skin and he has some pitting edema so I think it would be reasonable to try to diurese him in case there is some element of CHF as well. He did seem to improve with diuresis earlier. Principal Problem:   COPD exacerbation ([place]) Active Problems:   COPD (chronic obstructive pulmonary disease) (HCC)   Essential hypertension, benign   Respiratory failure (HCC)   Acute on chronic respiratory failure with hypercapnia (HCC)   Respiratory disorder with ventilator dependence ([place])   Fever   Acute bronchitis   Palliative care encounter   Goals of care, counseling/discussion   DNR (do not resuscitate) discussion   Ventilator dependence ([place])    Plan: Continue medications. Increase Lasix to see if that helps. Restart tube feedings. We can try weaning again today    LOS: 8 days   Lonzie Simmer L 02/15/2016, 7:24 AM

## 2016-02-16 ENCOUNTER — Inpatient Hospital Stay (HOSPITAL_COMMUNITY): Payer: Medicare HMO

## 2016-02-16 LAB — PROCALCITONIN: Procalcitonin: <0.1 ng/mL

## 2016-02-16 LAB — GLUCOSE, CAPILLARY
GLUCOSE-CAPILLARY: 164 mg/dL — AB (ref 65–99)
GLUCOSE-CAPILLARY: 193 mg/dL — AB (ref 65–99)
GLUCOSE-CAPILLARY: 181 mg/dL — AB (ref 65–99)
Glucose-Capillary: 169 mg/dL — ABNORMAL HIGH (ref 65–99)
Glucose-Capillary: 165 mg/dL — ABNORMAL HIGH (ref 65–99)
Glucose-Capillary: 170 mg/dL — ABNORMAL HIGH (ref 65–99)

## 2016-02-16 LAB — URINALYSIS, ROUTINE W REFLEX MICROSCOPIC
BILIRUBIN URINE: NEGATIVE
Bacteria, UA: NONE SEEN
GLUCOSE, UA: NEGATIVE mg/dL
KETONES UR: NEGATIVE mg/dL
LEUKOCYTES UA: NEGATIVE
NITRITE: NEGATIVE
PROTEIN: NEGATIVE mg/dL
Specific Gravity, Urine: 1.015 (ref 1.005–1.030)
pH: 5 (ref 5.0–8.0)

## 2016-02-16 LAB — BLOOD GAS, ARTERIAL
ACID-BASE EXCESS: 6.1 mmol/L — AB (ref 0.0–2.0)
Allens test (pass/fail): POSITIVE — AB
BICARBONATE: 29.8 mmol/L — AB (ref 20.0–28.0)
Drawn by: 382351
FIO2: 30
LHR: 20 {breaths}/min
MECHVT: 550 mL
O2 SAT: 96.8 %
PATIENT TEMPERATURE: 37
PCO2 ART: 42.6 mmHg (ref 32.0–48.0)
PEEP: 5 cmH2O
PH ART: 7.462 — AB (ref 7.350–7.450)
pO2, Arterial: 90.8 mmHg (ref 83.0–108.0)

## 2016-02-16 LAB — BASIC METABOLIC PANEL
Anion gap: 9 (ref 5–15)
BUN: 49 mg/dL — AB (ref 6–20)
CHLORIDE: 114 mmol/L — AB (ref 101–111)
CO2: 30 mmol/L (ref 22–32)
CREATININE: 1.42 mg/dL — AB (ref 0.61–1.24)
Calcium: 7.6 mg/dL — ABNORMAL LOW (ref 8.9–10.3)
GFR calc Af Amer: 54 mL/min — ABNORMAL LOW (ref >60)
GFR calc non Af Amer: 47 mL/min — ABNORMAL LOW (ref >60)
Glucose, Bld: 175 mg/dL — ABNORMAL HIGH (ref 65–99)
Potassium: 3 mmol/L — ABNORMAL LOW (ref 3.5–5.1)
Sodium: 153 mmol/L — ABNORMAL HIGH (ref 135–145)

## 2016-02-16 LAB — TRIGLYCERIDES: Triglycerides: 210 mg/dL — ABNORMAL HIGH (ref <150)

## 2016-02-16 LAB — MAGNESIUM: Magnesium: 2.2 mg/dL (ref 1.7–2.4)

## 2016-02-16 MED ORDER — POTASSIUM CHLORIDE 20 MEQ/15ML (10%) PO SOLN
40.0000 meq | Freq: Every day | ORAL | Status: DC
  Administered 2016-02-16 – 2016-02-20 (×5): 40 meq via ORAL
  Filled 2016-02-16 (×5): qty 30

## 2016-02-16 MED ORDER — FENTANYL CITRATE (PF) 2500 MCG/50ML IJ SOLN
INTRAMUSCULAR | Status: AC
  Filled 2016-02-16: qty 50

## 2016-02-16 NOTE — Progress Notes (Signed)
Subjective: He is overall unchanged. We have been able to reduce his FiO2 to 30%. No new problems have been noted. He is tolerating tube feedings okay.  Objective: Vital signs in last 24 hours: Temp:  [98.9 F (37.2 C)-101.9 F (38.8 C)] 101.2 F (38.4 C) (02/01 0600) Pulse Rate:  [69-121] 81 (02/01 0600) Resp:  [5-20] 20 (02/01 0600) BP: (101-152)/(52-94) 101/63 (02/01 0600) SpO2:  [95 %-100 %] 98 % (02/01 0400) FiO2 (%):  [30 %] 30 % (02/01 0257) Weight:  [64.8 kg (142 lb 13.7 oz)] 64.8 kg (142 lb 13.7 oz) (02/01 0500) Weight change: -2.4 kg (-5 lb 4.7 oz) Last BM Date: 02/12/16  Intake/Output from previous day: 01/31 0701 - 02/01 0700 In: 1536.3 [I.V.:1086.3; IV Piggyback:450] Out: 4650 [Urine:4650]  PHYSICAL EXAM General appearance: Intubated sedated on mechanical ventilation Resp: rhonchi bilaterally Cardio: regular rate and rhythm, S1, S2 normal, no murmur, click, rub or gallop GI: soft, non-tender; bowel sounds normal; no masses,  no organomegaly Extremities: He has less generalized puffiness than yesterday Skin warm and dry with multiple bruises. Mucous membranes are moist  Lab Results:  Results for orders placed or performed during the hospital encounter of 02/06/2016 (from the past 48 hour(s))  Glucose, capillary     Status: Abnormal   Collection Time: 02/14/16  4:20 PM  Result Value Ref Range   Glucose-Capillary 135 (H) 65 - 99 mg/dL  Glucose, capillary     Status: Abnormal   Collection Time: 02/14/16  7:34 PM  Result Value Ref Range   Glucose-Capillary 135 (H) 65 - 99 mg/dL   Comment 1 Notify RN   Glucose, capillary     Status: Abnormal   Collection Time: 02/14/16 11:57 PM  Result Value Ref Range   Glucose-Capillary 166 (H) 65 - 99 mg/dL   Comment 1 Notify RN   Glucose, capillary     Status: Abnormal   Collection Time: 02/15/16  4:02 AM  Result Value Ref Range   Glucose-Capillary 128 (H) 65 - 99 mg/dL   Comment 1 Notify RN   Basic metabolic panel      Status: Abnormal   Collection Time: 02/15/16  4:17 AM  Result Value Ref Range   Sodium 151 (H) 135 - 145 mmol/L   Potassium 3.6 3.5 - 5.1 mmol/L   Chloride 116 (H) 101 - 111 mmol/L   CO2 24 22 - 32 mmol/L   Glucose, Bld 141 (H) 65 - 99 mg/dL   BUN 40 (H) 6 - 20 mg/dL   Creatinine, Ser 1.23 0.61 - 1.24 mg/dL   Calcium 7.8 (L) 8.9 - 10.3 mg/dL   GFR calc non Af Amer 56 (L) >60 mL/min   GFR calc Af Amer >60 >60 mL/min    Comment: (NOTE) The eGFR has been calculated using the CKD EPI equation. This calculation has not been validated in all clinical situations. eGFR's persistently <60 mL/min signify possible Chronic Kidney Disease.    Anion gap 11 5 - 15  CBC     Status: None   Collection Time: 02/15/16  4:17 AM  Result Value Ref Range   WBC 6.1 4.0 - 10.5 K/uL   RBC 4.38 4.22 - 5.81 MIL/uL   Hemoglobin 13.8 13.0 - 17.0 g/dL   HCT 41.9 39.0 - 52.0 %   MCV 95.7 78.0 - 100.0 fL   MCH 31.5 26.0 - 34.0 pg   MCHC 32.9 30.0 - 36.0 g/dL   RDW 15.3 11.5 - 15.5 %   Platelets  159 150 - 400 K/uL  Triglycerides     Status: Abnormal   Collection Time: 02/15/16  4:17 AM  Result Value Ref Range   Triglycerides 317 (H) <150 mg/dL  Procalcitonin - Baseline     Status: None   Collection Time: 02/15/16  4:17 AM  Result Value Ref Range   Procalcitonin <0.10 ng/mL  Hepatic function panel     Status: Abnormal   Collection Time: 02/15/16  4:17 AM  Result Value Ref Range   Total Protein 5.1 (L) 6.5 - 8.1 g/dL   Albumin 2.8 (L) 3.5 - 5.0 g/dL   AST 30 15 - 41 U/L   ALT 51 17 - 63 U/L   Alkaline Phosphatase 37 (L) 38 - 126 U/L   Total Bilirubin 1.0 0.3 - 1.2 mg/dL   Bilirubin, Direct 0.2 0.1 - 0.5 mg/dL   Indirect Bilirubin 0.8 0.3 - 0.9 mg/dL  Blood gas, arterial     Status: Abnormal   Collection Time: 02/15/16  6:10 AM  Result Value Ref Range   FIO2 40.00    Delivery systems VENTILATOR    Mode PRESSURE REGULATED VOLUME CONTROL    VT 550 mL   LHR 25 resp/min   Peep/cpap 5.0 cm H20   pH,  Arterial 7.398 7.350 - 7.450   pCO2 arterial 38.2 32.0 - 48.0 mmHg   pO2, Arterial 160 (H) 83.0 - 108.0 mmHg   Bicarbonate 23.6 20.0 - 28.0 mmol/L   Acid-base deficit 1.1 0.0 - 2.0 mmol/L   O2 Saturation 98.7 %   Patient temperature 37.0    Collection site RIGHT RADIAL    Drawn by [id number]    Sample type ARTERIAL DRAW    Allens test (pass/fail) POSITIVE (A) PASS  Glucose, capillary     Status: Abnormal   Collection Time: 02/15/16  7:17 AM  Result Value Ref Range   Glucose-Capillary 153 (H) 65 - 99 mg/dL  Protein / creatinine ratio, urine     Status: None   Collection Time: 02/15/16  8:09 AM  Result Value Ref Range   Creatinine, Urine <10 mg/dL   Total Protein, Urine <6 mg/dL    Comment: NO NORMAL RANGE ESTABLISHED FOR THIS TEST   Protein Creatinine Ratio        0.00 - 0.15 mg/mg[Cre]    Comment: RESULT BELOW REPORTABLE RANGE, UNABLE TO CALCULATE.   Glucose, capillary     Status: Abnormal   Collection Time: 02/15/16 11:10 AM  Result Value Ref Range   Glucose-Capillary 186 (H) 65 - 99 mg/dL  Glucose, capillary     Status: Abnormal   Collection Time: 02/15/16  4:19 PM  Result Value Ref Range   Glucose-Capillary 169 (H) 65 - 99 mg/dL   Comment 1 Notify RN    Comment 2 Document in Chart   Glucose, capillary     Status: Abnormal   Collection Time: 02/15/16  7:22 PM  Result Value Ref Range   Glucose-Capillary 190 (H) 65 - 99 mg/dL  Glucose, capillary     Status: Abnormal   Collection Time: 02/16/16  1:19 AM  Result Value Ref Range   Glucose-Capillary 169 (H) 65 - 99 mg/dL  Procalcitonin     Status: None   Collection Time: 02/16/16  4:21 AM  Result Value Ref Range   Procalcitonin <0.10 ng/mL  Basic metabolic panel     Status: Abnormal   Collection Time: 02/16/16  4:21 AM  Result Value Ref Range   Sodium 153 (H) 135 -  145 mmol/L   Potassium 3.0 (L) 3.5 - 5.1 mmol/L   Chloride 114 (H) 101 - 111 mmol/L   CO2 30 22 - 32 mmol/L   Glucose, Bld 175 (H) 65 - 99 mg/dL   BUN 49 (H)  6 - 20 mg/dL   Creatinine, Ser 1.42 (H) 0.61 - 1.24 mg/dL   Calcium 7.6 (L) 8.9 - 10.3 mg/dL   GFR calc non Af Amer 47 (L) >60 mL/min   GFR calc Af Amer 54 (L) >60 mL/min    Comment: (NOTE) The eGFR has been calculated using the CKD EPI equation. This calculation has not been validated in all clinical situations. eGFR's persistently <60 mL/min signify possible Chronic Kidney Disease.    Anion gap 9 5 - 15  Magnesium     Status: None   Collection Time: 02/16/16  4:21 AM  Result Value Ref Range   Magnesium 2.2 1.7 - 2.4 mg/dL  Triglycerides     Status: Abnormal   Collection Time: 02/16/16  4:21 AM  Result Value Ref Range   Triglycerides 210 (H) <150 mg/dL  Glucose, capillary     Status: Abnormal   Collection Time: 02/16/16  4:24 AM  Result Value Ref Range   Glucose-Capillary 165 (H) 65 - 99 mg/dL  Blood gas, arterial     Status: Abnormal   Collection Time: 02/16/16  5:48 AM  Result Value Ref Range   FIO2 30.00    Delivery systems VENTILATOR    Mode PRESSURE REGULATED VOLUME CONTROL    VT 550 mL   LHR 20 resp/min   Peep/cpap 5.0 cm H20   pH, Arterial 7.462 (H) 7.350 - 7.450   pCO2 arterial 42.6 32.0 - 48.0 mmHg   pO2, Arterial 90.8 83.0 - 108.0 mmHg   Bicarbonate 29.8 (H) 20.0 - 28.0 mmol/L   Acid-Base Excess 6.1 (H) 0.0 - 2.0 mmol/L   O2 Saturation 96.8 %   Patient temperature 37.0    Collection site RIGHT RADIAL    Drawn by [id number]    Sample type ARTERIAL DRAW    Allens test (pass/fail) POSITIVE (A) PASS  Glucose, capillary     Status: Abnormal   Collection Time: 02/16/16  8:10 AM  Result Value Ref Range   Glucose-Capillary 164 (H) 65 - 99 mg/dL   Comment 1 Notify RN    Comment 2 Document in Chart     ABGS  Recent Labs  02/16/16 0548  PHART 7.462*  PO2ART 90.8  HCO3 29.8*   CULTURES Recent Results (from the past 240 hour(s))  MRSA PCR Screening     Status: Abnormal   Collection Time: 01/23/2016  8:30 PM  Result Value Ref Range Status   MRSA by PCR  POSITIVE (A) NEGATIVE Final    Comment:        The GeneXpert MRSA Assay (FDA approved for NASAL specimens only), is one component of a comprehensive MRSA colonization surveillance program. It is not intended to diagnose MRSA infection nor to guide or monitor treatment for MRSA infections. RESULT CALLED TO, READ BACK BY AND VERIFIED WITH: ROBERTS,T ON 01/29/2016 AT 2255 BY LOY,C   Culture, blood (Routine X 2) w Reflex to ID Panel     Status: None   Collection Time: 02/09/16  8:24 AM  Result Value Ref Range Status   Specimen Description BLOOD LEFT HAND  Final   Special Requests BOTTLES DRAWN AEROBIC ONLY [place]  Final   Culture NO GROWTH 5 DAYS  Final   Report  Status 02/14/2016 FINAL  Final  Culture, blood (Routine X 2) w Reflex to ID Panel     Status: None   Collection Time: 02/09/16  9:31 AM  Result Value Ref Range Status   Specimen Description BLOOD RIGHT HAND  Final   Special Requests BOTTLES DRAWN AEROBIC AND ANAEROBIC 6cc  Final   Culture NO GROWTH 5 DAYS  Final   Report Status 02/14/2016 FINAL  Final   Studies/Results: Dg Chest Port 1 View  Result Date: 02/16/2016 CLINICAL DATA:  Respiratory failure. EXAM: PORTABLE CHEST 1 VIEW COMPARISON:  02/13/2016 . FINDINGS: Endotracheal tube and NG tube in stable position. Mild right base infiltrate cannot be excluded. No pleural effusion or pneumothorax. No acute bony abnormality . IMPRESSION: 1.  Lines and tubes in stable position. 2.  Mild right base infiltrate cannot be excluded . Electronically Signed   By: Marcello Moores  Register   On: 02/16/2016 07:32    Medications:  Prior to Admission:  Prescriptions Prior to Admission  Medication Sig Dispense Refill Last Dose  . albuterol (PROVENTIL HFA;VENTOLIN HFA) 108 (90 Base) MCG/ACT inhaler Inhale 1-2 puffs into the lungs every 6 (six) hours as needed for wheezing or shortness of breath. 1 Inhaler 0 Past Week at Unknown time  . albuterol (PROVENTIL) (2.5 MG/3ML) 0.083% nebulizer solution Take 3  mLs (2.5 mg total) by nebulization every 6 (six) hours as needed for wheezing or shortness of breath. 150 mL 1 Past Week at Unknown time  . ALPRAZolam (XANAX) 1 MG tablet Take 1 tablet (1 mg total) by mouth 3 (three) times daily as needed for anxiety. 90 tablet 1 Past Week at Unknown time  . amLODipine (NORVASC) 10 MG tablet Take 10 mg by mouth daily.   Past Week at Unknown time  . budesonide-formoterol (SYMBICORT) 160-4.5 MCG/ACT inhaler Inhale 2 puffs into the lungs 2 (two) times daily.   Past Week at Unknown time  . FLUoxetine (PROZAC) 40 MG capsule Take 1 capsule (40 mg total) by mouth daily. 30 capsule 3 Past Week at Unknown time  . ibuprofen (ADVIL,MOTRIN) 200 MG tablet Take 200 mg by mouth every 6 (six) hours as needed for headache, mild pain or moderate pain. For pain    Past Week at Unknown time  . Ivermectin 0.5 % LOTN Apply thoroughly to dry hair and scalp; leave lotion on hair for 10 minutes, and then rinse with water. 1 Tube 1 Past Week at Unknown time  . loratadine (CLARITIN) 10 MG tablet Take 1 tablet (10 mg total) by mouth daily. 30 tablet 11 Past Month at Unknown time  . omeprazole (PRILOSEC) 20 MG capsule take 1 capsule by mouth once daily 30 capsule 3 Past Month at Unknown time  . propranolol (INDERAL) 10 MG tablet take 1 tablet by mouth twice a day for TREMOR 60 tablet 3 Past Week at Unknown time  . rosuvastatin (CRESTOR) 10 MG tablet take 1 tablet by mouth once daily 30 tablet 3 Past Week at Unknown time  . tiotropium (SPIRIVA) 18 MCG inhalation capsule Place 18 mcg into inhaler and inhale daily.   Past Week at Unknown time  . vitamin B-12 (CYANOCOBALAMIN) 1000 MCG tablet Take 1,000 mcg by mouth daily.   Past Week at Unknown time  . nitroGLYCERIN (NITROSTAT) 0.4 MG SL tablet Place 0.4 mg under the tongue every 5 (five) minutes as needed for chest pain.   unknown  . [DISCONTINUED] predniSONE (DELTASONE) 20 MG tablet 2 tabs po daily x 4 days 8 tablet  0    Scheduled: .  chlorhexidine gluconate (MEDLINE KIT)  15 mL Mouth Rinse BID  . enoxaparin (LOVENOX) injection  40 mg Subcutaneous Q24H  . famotidine (PEPCID) IV  20 mg Intravenous Q12H  . fentaNYL (SUBLIMAZE) injection  50 mcg Intravenous Once  . furosemide  40 mg Intravenous Q12H  . ipratropium-albuterol  3 mL Nebulization Q6H  . mouth rinse  15 mL Mouth Rinse QID  . methylPREDNISolone (SOLU-MEDROL) injection  60 mg Intravenous Q6H  . nicotine  21 mg Transdermal Daily  . piperacillin-tazobactam (ZOSYN)  IV  3.375 g Intravenous Q8H  . potassium chloride  40 mEq Oral Daily  . propofol  20 mg Intravenous Once  . propofol (DIPRIVAN) infusion  5-80 mcg/kg/min Intravenous Once  . vancomycin  1,000 mg Intravenous Q24H   Continuous: . feeding supplement (VITAL AF 1.2 CAL) 1,000 mL (02/16/16 0814)  . fentaNYL infusion INTRAVENOUS 200 mcg/hr (02/16/16 0302)  . propofol (DIPRIVAN) infusion 30.059 mcg/kg/min (02/16/16 0815)   ELG:[id number], albuterol, bisacodyl, fentaNYL, sodium chloride  Assesment:He has COPD exacerbation. He has acute on chronic hypoxic/hypercapnic respiratory failure. We have not been able to wean him. His prognosis is extremely poor. At some point after further discussion with the family I would be in favor of extubating and seeing if he can do okay with that with no plans for reintubation. I'm not sure that the family will agree. Principal Problem:   COPD exacerbation ([place]) Active Problems:   COPD (chronic obstructive pulmonary disease) (HCC)   Essential hypertension, benign   Respiratory failure (HCC)   Acute on chronic respiratory failure with hypercapnia (HCC)   Respiratory disorder with ventilator dependence ([place])   Fever   Acute bronchitis   Palliative care encounter   Goals of care, counseling/discussion   DNR (do not resuscitate) discussion   Ventilator dependence ([place])   Acute respiratory failure with hypoxia and hypercarbia (HCC)   Elevated troponin    Plan: As  above    LOS: 9 days   [name] L 02/16/2016, 8:35 AM

## 2016-02-16 NOTE — Plan of Care (Signed)
Problem: Fluid Volume: Goal: Ability to maintain a balanced intake and output will improve Outcome: Progressing Patient having good output with Lasix.

## 2016-02-16 NOTE — Progress Notes (Signed)
Called Dr. Carles Collet to tell him about patient consistent elevated heart rate and BP elevation. Will obtain STAT EKG.

## 2016-02-16 NOTE — Progress Notes (Signed)
PROGRESS NOTE  [name] PFX:[id number] DOB: 1940/11/14 DOA: 01/25/2016 PCP: Vic Blackbird, MD  Brief History:  76 year old man admitted to the hospital on 1/23 due to shortness of breath, was intubated in the emergency department. Continued difficulty with resp acidosis on vent. Abdominal distention but Xray without obstruction. Tube feeds have been held. 1/28: ABG improved. With significant increased volume and changes on CXR that may represent volume overload. Weaning attempts have been unsuccessful initially due to respiratory acidosis and now due to tachycardia every time sedation is decreased. Palliative care and myself have had multiple family discussions, some family members seem to be unrealistic with expectations. Patient has very poor prognosis.  Pulmonary medicine continues to follow.  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 Luan Pulling input and recommendations. -has failed weaning attempts -appreciate Palliative medicine continued discussions  New Fever -UA and urine culture -blood cultures x 2 sets -tracheal aspirate/culture -procalcitonin <0.10 -Chest x-ray--negative for infiltrates; shows chronic interstitial prominence  COPD Exacerbation -continue solumedrol -continue DuoNebs -appreciate Dr. Kathaleen Grinder followup  HCAP -check procalcitonin -plan to d/c abx after today's doses  Elevated troponins -Due to demand ischemia -02/10/2016 echo EF 55-60%, grade 1 daily, technically difficult -No plans for aggressive cardiac workup  Paroxsymal Afib--new onset -secondary to infection and respiratory failure/COPD -presently in sinus  Lower extremity edema -check albumin--2.8 -urine protein/creatinine ratio--unable to calculate as urine protein <6 -increased lasix per Dr.  Luan Pulling -daily weights--NEG 6 lbs since increased lasix; NEG 3 L in past 24 hours -Am BMP  FEN -agree with restart enteral feeds-->tolerating @50cc /hr -hypernatremia due to furosemide -Hypokalemia--> replete -Magnesium 2.2  Goals of Care -Appreciate palliative medicine follow-up -Patient is now no defibrillation, no chest compressions      Disposition Plan:   Remain in ICU  Family Communication:  No Family at bedside--Total time spent 35 minutes.  Greater than 50% spent face to face counseling and coordinating care.   Consultants:  Dr. Luan Pulling  Code Status:  FULL  DVT Prophylaxis:  [place] Lovenox   Procedures: As Listed in Progress Note Above  Antibiotics:  Zosyn   02/09/2016>>>  Vancomycin  02/09/2016>>>       Subjective: Patient sedated on the ventilator. No reports of vomiting, respiratory distress, uncontrolled pain. No reports of diarrhea. He is tolerating enteral feedings.  Objective: Vitals:   02/16/16 0300 02/16/16 0400 02/16/16 0500 02/16/16 0600  BP: 121/63 102/64 112/68 101/63  Pulse: (!) 105 100 100 81  Resp: 20 20 20 20   Temp: (!) 101.6 F (38.7 C) (!) 101.9 F (38.8 C) (!) 101.6 F (38.7 C) (!) 101.2 F (38.4 C)  TempSrc:  Core (Comment)    SpO2:  98%    Weight:   64.8 kg (142 lb 13.7 oz)   Height:        Intake/Output Summary (Last 24 hours) at 02/16/16 0739 Last data filed at 02/16/16 0600  Gross per 24 hour  Intake           1536.3 ml  Output             4650 ml  Net          -3113.7 ml   Weight change: -2.4 kg (-5 lb 4.7 oz) Exam:   General:  Pt is alert, follows commands appropriately, not in acute distress  HEENT:  No icterus, No thrush, No neck mass, [place]/AT  Cardiovascular: RRR, S1/S2, no rubs, no gallops  Respiratory: CTA bilaterally, no wheezing, no crackles, no rhonchi  Abdomen: Soft/+BS, non tender, non distended, no guarding  Extremities: No edema, No lymphangitis, No petechiae, No rashes, no  synovitis   Data Reviewed: I have personally reviewed following labs and imaging studies Basic Metabolic Panel:  Recent Labs Lab 02/09/16 0824 02/09/16 1703  02/10/16 0530 02/10/16 1645 02/11/16 0933 02/12/16 1852 02/13/16 0553 02/15/16 0417 02/16/16 0421  NA  --   --   < > 142  --  144 146* 149* 151* 153*  K  --   --   < > 4.3  --  5.1 4.2 4.3 3.6 3.0*  CL  --   --   < > 113*  --  117* 115* 118* 116* 114*  CO2  --   --   < > 23  --  20* 23 20* 24 30  GLUCOSE  --   --   < > 172*  --  135* 129* 124* 141* 175*  BUN  --   --   < > 49*  --  56* 49* 45* 40* 49*  CREATININE  --   --   < > 1.58*  --  1.54* 1.45* 1.31* 1.23 1.42*  CALCIUM  --   --   < > 7.7*  --  7.8* 7.7* 7.6* 7.8* 7.6*  MG 2.1 2.0  --  2.3 2.3  --   --   --   --  2.2  PHOS 3.8 3.7  --  3.5 3.2  --   --   --   --   --   < > = values in this interval not displayed. Liver Function Tests:  Recent Labs Lab 02/15/16 0417  AST 30  ALT 51  ALKPHOS 37*  BILITOT 1.0  PROT 5.1*  ALBUMIN 2.8*   No results for input(s): LIPASE, AMYLASE in the last 168 hours. No results for input(s): AMMONIA in the last 168 hours. Coagulation Profile: No results for input(s): INR, PROTIME in the last 168 hours. CBC:  Recent Labs Lab 02/10/16 0530 02/11/16 0933 02/12/16 0734 02/15/16 0417  WBC 6.5 7.1 4.7 6.1  HGB 12.5* 13.6 13.7 13.8  HCT 39.5 44.4 42.7 41.9  MCV 100.5* 101.1* 98.4 95.7  PLT 182 65* 131* 159   Cardiac Enzymes:  Recent Labs Lab 02/09/16 0827 02/09/16 1500 02/09/16 1924  TROPONINI 0.74* 0.86* 2.18*   BNP: Invalid input(s): POCBNP CBG:  Recent Labs Lab 02/15/16 1110 02/15/16 1619 02/15/16 1922 02/16/16 0119 02/16/16 0424  GLUCAP 186* 169* 190* 169* 165*   HbA1C: No results for input(s): HGBA1C in the last 72 hours. Urine analysis:    Component Value Date/Time   COLORURINE YELLOW 01/30/2016 1544   APPEARANCEUR HAZY (A) 01/22/2016 1544   LABSPEC 1.015 02/02/2016 1544   PHURINE 5.0  01/27/2016 1544   GLUCOSEU NEGATIVE 02/06/2016 1544   HGBUR SMALL (A) 02/04/2016 1544   BILIRUBINUR NEGATIVE 01/22/2016 1544   KETONESUR 20 (A) 01/23/2016 1544   PROTEINUR 100 (A) 02/08/2016 1544   UROBILINOGEN 0.2 04/19/2014 1946   NITRITE NEGATIVE 01/21/2016 1544   LEUKOCYTESUR NEGATIVE 01/25/2016 1544   Sepsis Labs: '@LABRCNTIP'$ (procalcitonin:4,lacticidven:4) ) Recent Results (from the past 240 hour(s))  MRSA PCR Screening     Status: Abnormal   Collection Time: 02/02/2016  8:30 PM  Result Value Ref Range Status   MRSA by PCR POSITIVE (A) NEGATIVE Final  Comment:        The GeneXpert MRSA Assay (FDA approved for NASAL specimens only), is one component of a comprehensive MRSA colonization surveillance program. It is not intended to diagnose MRSA infection nor to guide or monitor treatment for MRSA infections. RESULT CALLED TO, READ BACK BY AND VERIFIED WITH: ROBERTS,T ON 01/27/2016 AT 2255 BY LOY,C   Culture, blood (Routine X 2) w Reflex to ID Panel     Status: None   Collection Time: 02/09/16  8:24 AM  Result Value Ref Range Status   Specimen Description BLOOD LEFT HAND  Final   Special Requests BOTTLES DRAWN AEROBIC ONLY 6CC  Final   Culture NO GROWTH 5 DAYS  Final   Report Status 02/14/2016 FINAL  Final  Culture, blood (Routine X 2) w Reflex to ID Panel     Status: None   Collection Time: 02/09/16  9:31 AM  Result Value Ref Range Status   Specimen Description BLOOD RIGHT HAND  Final   Special Requests BOTTLES DRAWN AEROBIC AND ANAEROBIC 6cc  Final   Culture NO GROWTH 5 DAYS  Final   Report Status 02/14/2016 FINAL  Final     Scheduled Meds: . chlorhexidine gluconate (MEDLINE KIT)  15 mL Mouth Rinse BID  . enoxaparin (LOVENOX) injection  40 mg Subcutaneous Q24H  . famotidine (PEPCID) IV  20 mg Intravenous Q12H  . fentaNYL (SUBLIMAZE) injection  50 mcg Intravenous Once  . furosemide  40 mg Intravenous Q12H  . ipratropium-albuterol  3 mL Nebulization Q6H  . mouth  rinse  15 mL Mouth Rinse QID  . methylPREDNISolone (SOLU-MEDROL) injection  60 mg Intravenous Q6H  . nicotine  21 mg Transdermal Daily  . piperacillin-tazobactam (ZOSYN)  IV  3.375 g Intravenous Q8H  . propofol  20 mg Intravenous Once  . propofol (DIPRIVAN) infusion  5-80 mcg/kg/min Intravenous Once  . vancomycin  1,000 mg Intravenous Q24H   Continuous Infusions: . feeding supplement (VITAL AF 1.2 CAL) 1,000 mL (02/15/16 0803)  . fentaNYL infusion INTRAVENOUS 200 mcg/hr (02/16/16 0302)  . propofol (DIPRIVAN) infusion 30 mcg/kg/min (02/16/16 0140)    Procedures/Studies: Dg Abd 1 View  Result Date: 02/10/2016 CLINICAL DATA:  76 year old male are with abdominal distention and concern for small bowel obstruction. Decreased bowel sounds. EXAM: ABDOMEN - 1 VIEW COMPARISON:  None. FINDINGS: An enteric tube is partially visualized with tip in the proximal stomach. The side port of the enteric tube appears to be at the level of the gastroesophageal junction. There is no bowel dilatation or evidence of obstruction. Air is noted within the stomach and colon. There is thickened appearance of the gastric wall which may be artifactual. Clinical correlation is recommended to evaluate for gastritis. No free air or radiopaque calculi identified. There is degenerative changes of the spine. No acute osseous pathology identified. IMPRESSION: No bowel dilatation or evidence of obstruction. Enteric tube with tip in the proximal stomach. Artifact versus less likely thickening of the gastric wall. Clinical correlation is recommended to evaluate for gastritis. Electronically Signed   By: Anner Crete M.D.   On: 02/10/2016 23:55   Ct Head Wo Contrast  Result Date: 01/21/2016 CLINICAL DATA:  Unresponsive.  Possible unintentional overdose. EXAM: CT HEAD WITHOUT CONTRAST TECHNIQUE: Contiguous axial images were obtained from the base of the skull through the vertex without intravenous contrast. COMPARISON:  Head CT  04/19/2014 and MRI brain 04/19/2014 FINDINGS: Brain: Stable age related cerebral atrophy, ventriculomegaly and periventricular white matter disease. No extra-axial fluid collections  are identified. No CT findings for acute hemispheric infarction or intracranial hemorrhage. No mass lesions. The brainstem and cerebellum are normal. Vascular: Stable vascular calcifications. No hyperdense vessels or obvious aneurysm. Skull: No skull fracture or bone lesions. Sinuses/Orbits: Extensive chronic paranasal sinus disease mainly involving the ethmoid air cells, left maxillary sinus and left half of the sphenoid sinus. The mastoid air cells and middle ear cavities are clear. The globes are intact. Other: No scalp lesions or scalp hematoma. IMPRESSION: 1. Age related cerebral atrophy, ventriculomegaly and periventricular white matter disease, unchanged when compared to prior examination. 2. No acute intracranial findings or mass lesions. 3. Chronic sinus disease. Electronically Signed   By: Marijo Sanes M.D.   On: 01/19/2016 17:00   Dg Chest Port 1 View  Result Date: 02/16/2016 CLINICAL DATA:  Respiratory failure. EXAM: PORTABLE CHEST 1 VIEW COMPARISON:  02/13/2016 . FINDINGS: Endotracheal tube and NG tube in stable position. Mild right base infiltrate cannot be excluded. No pleural effusion or pneumothorax. No acute bony abnormality . IMPRESSION: 1.  Lines and tubes in stable position. 2.  Mild right base infiltrate cannot be excluded . Electronically Signed   By: Marcello Moores  Register   On: 02/16/2016 07:32   Dg Chest Port 1 View  Result Date: 02/13/2016 CLINICAL DATA:  Follow-up chest x-ray, ventilated patient. History of COPD, acute on chronic respiratory failure current smoker. EXAM: PORTABLE CHEST 1 VIEW COMPARISON:  Portable chest x-ray of February 12, 2016 FINDINGS: The patient is rotated toward the right. The lungs are hyperinflated. There is no focal infiltrate. There is a trace of blunting of the costophrenic  angles. The lung markings are coarse in the right upper lobe. There is no pneumothorax nor pneumomediastinum. The heart and pulmonary vascularity are normal. The endotracheal tube tip lies 6 cm above the carina. The esophagogastric tube tip projects below the inferior margin of the image. IMPRESSION: COPD, coarse right upper lobe interstitial markings may reflect subsegmental atelectasis or fibrosis. Trace bilateral pleural effusions. No alveolar pneumonia nor pulmonary edema. The support tubes are in reasonable position. Electronically Signed   By: Berkley Cronkright  [place] M.D.   On: 02/13/2016 07:33   Dg Chest Port 1 View  Result Date: 02/12/2016 CLINICAL DATA:  Shortness of Breath EXAM: PORTABLE CHEST 1 VIEW COMPARISON:  02/11/2016 FINDINGS: There is hyperinflation of the lungs compatible with COPD. Endotracheal tube and NG tube are unchanged. No confluent opacities or effusions. Heart is normal size. IMPRESSION: COPD.  No acute findings. Electronically Signed   By: Rolm Baptise M.D.   On: 02/12/2016 07:52   Dg Chest Port 1 View  Result Date: 02/11/2016 CLINICAL DATA:  Short of breath. Ventilated patient. Subsequent encounter. EXAM: PORTABLE CHEST 1 VIEW COMPARISON:  02/10/2016 FINDINGS: Endotracheal tube and nasal/orogastric tube are stable. Lungs are hyperexpanded. There changes of emphysema with decrease markings in the peripheral lungs most evident on the left. No evidence of pneumonia or pulmonary edema. No pleural effusion or pneumothorax. IMPRESSION: 1. No acute findings in the lungs. 2. COPD. 3. Support apparatus is stable and well positioned. Electronically Signed   By: Lajean Manes M.D.   On: 02/11/2016 08:04   Dg Chest Port 1 View  Result Date: 02/10/2016 CLINICAL DATA:  Ventilator dependent EXAM: PORTABLE CHEST 1 VIEW COMPARISON:  02/09/2016 FINDINGS: Endotracheal tube and nasogastric catheter are again seen and stable. Cardiac shadow is within normal limits. Previously seen patchy changes in the  right upper lobe have resolved in the interval. No new focal infiltrate  is seen. No bony abnormality is noted. IMPRESSION: No acute abnormality seen. Tubes and lines as described. Electronically Signed   By: Inez Catalina M.D.   On: 02/10/2016 09:01   Dg Chest Port 1 View  Result Date: 02/09/2016 CLINICAL DATA:  Irregular heartbeat.  Chest pain . EXAM: PORTABLE CHEST 1 VIEW COMPARISON:  01/27/2016. FINDINGS: Endotracheal tube noted with tip 3 cm above the carina. NG tube noted with tip below left hemidiaphragm. Mild infiltrate in the right upper lobe. Questionable nodular density is noted in the right apex. Small bilateral pleural effusions. Apical pleural-parenchymal thickening and cystic changes again noted on the right. These changes are consistent with scarring. No pneumothorax. Heart size stable. IMPRESSION: 1.  Endotracheal tube and NG tube in good anatomic position. 2. Questionable nodule right upper lobe. Questionable right upper lobe infiltrate. Follow-up chest x-rays recommended to demonstrate clearing. Electronically Signed   By: Marcello Moores  Register   On: 02/09/2016 08:11   Dg Chest Port 1 View  Result Date: 01/22/2016 CLINICAL DATA:  Respiratory failure EXAM: PORTABLE CHEST 1 VIEW COMPARISON:  10/27/2015 FINDINGS: Endotracheal tube placed. Tip is 4.5 cm from the carina. NG tube tip is beyond the gastroesophageal junction. Lungs are hyperaerated. New nodular density at the right apex measures less than 1 cm. No pneumothorax. Small right pleural effusion. IMPRESSION: Endotracheal and NG tubes placed. Subcentimeter nodule at the right apex has developed. Small right pleural effusion. Electronically Signed   By: Marybelle Killings M.D.   On: 01/22/2016 16:09    Layce Sprung, DO  [facility] Pager [phone]  If 7PM-7AM, please contact night-coverage [email or web address] Password [email or web address] 02/16/2016, 7:39 AM   LOS: 9 days

## 2016-02-16 DEATH — deceased

## 2016-02-17 ENCOUNTER — Inpatient Hospital Stay (HOSPITAL_COMMUNITY): Payer: Medicare HMO

## 2016-02-17 DIAGNOSIS — J441 Chronic obstructive pulmonary disease with (acute) exacerbation: Secondary | ICD-10-CM

## 2016-02-17 DIAGNOSIS — J9621 Acute and chronic respiratory failure with hypoxia: Secondary | ICD-10-CM

## 2016-02-17 DIAGNOSIS — Z9911 Dependence on respirator [ventilator] status: Secondary | ICD-10-CM

## 2016-02-17 DIAGNOSIS — J9622 Acute and chronic respiratory failure with hypercapnia: Secondary | ICD-10-CM

## 2016-02-17 DIAGNOSIS — R7989 Other specified abnormal findings of blood chemistry: Secondary | ICD-10-CM

## 2016-02-17 DIAGNOSIS — R509 Fever, unspecified: Secondary | ICD-10-CM

## 2016-02-17 LAB — BASIC METABOLIC PANEL
Anion gap: 10 (ref 5–15)
BUN: 60 mg/dL — AB (ref 6–20)
CALCIUM: 7.5 mg/dL — AB (ref 8.9–10.3)
CO2: 30 mmol/L (ref 22–32)
CREATININE: 1.56 mg/dL — AB (ref 0.61–1.24)
Chloride: 112 mmol/L — ABNORMAL HIGH (ref 101–111)
GFR calc Af Amer: 48 mL/min — ABNORMAL LOW (ref >60)
GFR, EST NON AFRICAN AMERICAN: 42 mL/min — AB (ref >60)
Glucose, Bld: 194 mg/dL — ABNORMAL HIGH (ref 65–99)
Potassium: 3.3 mmol/L — ABNORMAL LOW (ref 3.5–5.1)
SODIUM: 152 mmol/L — AB (ref 135–145)

## 2016-02-17 LAB — GLUCOSE, CAPILLARY
GLUCOSE-CAPILLARY: 212 mg/dL — AB (ref 65–99)
Glucose-Capillary: 190 mg/dL — ABNORMAL HIGH (ref 65–99)
Glucose-Capillary: 191 mg/dL — ABNORMAL HIGH (ref 65–99)
Glucose-Capillary: 216 mg/dL — ABNORMAL HIGH (ref 65–99)

## 2016-02-17 LAB — URINE CULTURE: Culture: NO GROWTH

## 2016-02-17 LAB — PROCALCITONIN

## 2016-02-17 MED ORDER — FENTANYL CITRATE (PF) 2500 MCG/50ML IJ SOLN
INTRAMUSCULAR | Status: AC
  Filled 2016-02-17: qty 50

## 2016-02-17 MED ORDER — CHLORHEXIDINE GLUCONATE CLOTH 2 % EX PADS
6.0000 | MEDICATED_PAD | Freq: Every day | CUTANEOUS | Status: DC
  Administered 2016-02-17 – 2016-02-20 (×4): 6 via TOPICAL

## 2016-02-17 MED ORDER — SODIUM CHLORIDE 0.9% FLUSH
10.0000 mL | INTRAVENOUS | Status: DC | PRN
Start: 2016-02-17 — End: 2016-02-21

## 2016-02-17 MED ORDER — IOPAMIDOL (ISOVUE-300) INJECTION 61%
INTRAVENOUS | Status: AC
Start: 2016-02-17 — End: 2016-02-17
  Administered 2016-02-17: 473 mL
  Filled 2016-02-17: qty 30

## 2016-02-17 MED ORDER — SODIUM CHLORIDE 0.9% FLUSH
10.0000 mL | Freq: Two times a day (BID) | INTRAVENOUS | Status: DC
  Administered 2016-02-17 – 2016-02-20 (×7): 10 mL

## 2016-02-17 NOTE — Progress Notes (Signed)
PROGRESS NOTE  [name] MBW:[id number] DOB: 01/28/1940 DOA: 01/27/2016 PCP: Vic Blackbird, MD  Brief History:  76 year old man admitted to the hospital on 1/23 due to shortness of breath, was intubated in the emergency department. Continued difficulty with resp acidosis on vent. Abdominal distention but Xray without obstruction. Tube feeds have been held. 1/28: ABG improved. With significant increased volume and changes on CXR that may represent volume overload. Weaning attempts have been unsuccessful initially due to respiratory acidosis and now due to tachycardia every time sedation is decreased. Palliative care and myself have had multiple family discussions, some family members seem to be unrealistic with expectations. Patient has very poor prognosis. Pulmonary medicine continues to follow.  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 Luan Pulling input and recommendations-->if unable to wean by 02/20/16-->will need trach/PEG -has failed weaning attempts -appreciate Palliative medicine continued discussions  New Fever -UA--no pyuria -blood cultures x 2 sets--neg to date -tracheal aspirate/culture -procalcitonin <0.10 -personally reviewed Chest x-ray--negative for infiltrates; shows chronic interstitial prominence -d/c antimicrobials and observe clinically -CT chest, abdomen/pelvis  COPD Exacerbation -continue solumedrol -continue DuoNebs -appreciate Dr. Kathaleen Grinder followup  HCAP -check procalcitonin -finished 8 days vanc/zosyn -d/c antimicrobials and observe  Elevated troponins -Due to demand ischemia -02/10/2016 echo EF 55-60%, grade 1 daily, technically difficult -No plans for aggressive cardiac workup  Paroxsymal Afib--new onset -secondary to infection and respiratory  failure/COPD -presently in sinus  Lower extremity edema -check albumin--2.8 -urine protein/creatinine ratio--unable to calculate as urine protein <6 -increased lasix per Dr. Luan Pulling -daily weights--NEG 10 lbs since increased lasix; NEG 5 L in past 48 hours -Am BMP  FEN -agree with restart enteral feeds-->tolerating @50cc /hr -hypernatremia due to furosemide -Hypokalemia--> replete -Magnesium 2.2 -diuresis per Dr. Luan Pulling  Goals of Care -Appreciate palliative medicine follow-up--discussed with Ms. Hulan Fray 02/17/16 -Patient is now no defibrillation, no chest compressions -complex family dynamics      Disposition Plan: Remain in ICU Family Communication: NoFamily at bedside--Total time spent 35 minutes. Greater than 50% spent face to face counseling and coordinating care.   Consultants: Dr. Luan Pulling  Code Status: FULL  DVT Prophylaxis: [place] Lovenox   Procedures: As Listed in Progress Note Above  Antibiotics: Zosyn 02/09/2016>>> Vancomycin 02/09/2016>>>   Subjective: Patient remains on the ventilator. No respiratory distress. He is tolerating enteral feedings. No reports of diarrhea, vomiting, uncontrolled pain.  Objective: Vitals:   02/17/16 0530 02/17/16 0600 02/17/16 0800 02/17/16 0838  BP: 99/61 109/71 98/67   Pulse: (!) 105 100 (!) 108   Resp: 20 20 20    Temp: (!) 100.9 F (38.3 C) (!) 101 F (38.3 C) (!) 101.6 F (38.7 C)   TempSrc:      SpO2: 98% 98% 97% 97%  Weight:      Height:        Intake/Output Summary (Last 24 hours) at 02/17/16 1112 Last data filed at 02/17/16 0809  Gross per 24 hour  Intake          1222.88 ml  Output             2650 ml  Net         -1427.12 ml   Weight change: -2.2 kg (-4 lb 13.6 oz) Exam:   General:  Pt is sedated on vent, not in acute distress  HEENT: No icterus, No thrush,  No neck mass, [place]/AT  Cardiovascular: RRR, S1/S2, no rubs, no gallops  Respiratory: Diminished breath sounds  bilateral. Bibasilar rales.  Abdomen: Soft/+BS, non tender, non distended, no guarding  Extremities: 1 + LE edema, No lymphangitis, No petechiae, No rashes, no synovitis   Data Reviewed: I have personally reviewed following labs and imaging studies Basic Metabolic Panel:  Recent Labs Lab 02/10/16 1645  02/12/16 1852 02/13/16 0553 02/15/16 0417 02/16/16 0421 02/17/16 0416  NA  --   < > 146* 149* 151* 153* 152*  K  --   < > 4.2 4.3 3.6 3.0* 3.3*  CL  --   < > 115* 118* 116* 114* 112*  CO2  --   < > 23 20* 24 30 30   GLUCOSE  --   < > 129* 124* 141* 175* 194*  BUN  --   < > 49* 45* 40* 49* 60*  CREATININE  --   < > 1.45* 1.31* 1.23 1.42* 1.56*  CALCIUM  --   < > 7.7* 7.6* 7.8* 7.6* 7.5*  MG 2.3  --   --   --   --  2.2  --   PHOS 3.2  --   --   --   --   --   --   < > = values in this interval not displayed. Liver Function Tests:  Recent Labs Lab 02/15/16 0417  AST 30  ALT 51  ALKPHOS 37*  BILITOT 1.0  PROT 5.1*  ALBUMIN 2.8*   No results for input(s): LIPASE, AMYLASE in the last 168 hours. No results for input(s): AMMONIA in the last 168 hours. Coagulation Profile: No results for input(s): INR, PROTIME in the last 168 hours. CBC:  Recent Labs Lab 02/11/16 0933 02/12/16 0734 02/15/16 0417  WBC 7.1 4.7 6.1  HGB 13.6 13.7 13.8  HCT 44.4 42.7 41.9  MCV 101.1* 98.4 95.7  PLT 65* 131* 159   Cardiac Enzymes: No results for input(s): CKTOTAL, CKMB, CKMBINDEX, TROPONINI in the last 168 hours. BNP: Invalid input(s): POCBNP CBG:  Recent Labs Lab 02/16/16 1540 02/16/16 1936 02/17/16 0005 02/17/16 0356 02/17/16 0734  GLUCAP 181* 170* 216* 190* 191*   HbA1C: No results for input(s): HGBA1C in the last 72 hours. Urine analysis:    Component Value Date/Time   COLORURINE YELLOW 02/16/2016 0739   APPEARANCEUR CLEAR 02/16/2016 0739   LABSPEC 1.015 02/16/2016 0739   PHURINE 5.0 02/16/2016 0739   GLUCOSEU NEGATIVE 02/16/2016 0739   HGBUR SMALL (A) 02/16/2016  0739   BILIRUBINUR NEGATIVE 02/16/2016 0739   KETONESUR NEGATIVE 02/16/2016 0739   PROTEINUR NEGATIVE 02/16/2016 0739   UROBILINOGEN 0.2 04/19/2014 1946   NITRITE NEGATIVE 02/16/2016 0739   LEUKOCYTESUR NEGATIVE 02/16/2016 0739   Sepsis Labs: @LABRCNTIP (procalcitonin:4,lacticidven:4) ) Recent Results (from the past 240 hour(s))  MRSA PCR Screening     Status: Abnormal   Collection Time: 02/15/2016  8:30 PM  Result Value Ref Range Status   MRSA by PCR POSITIVE (A) NEGATIVE Final    Comment:        The GeneXpert MRSA Assay (FDA approved for NASAL specimens only), is one component of a comprehensive MRSA colonization surveillance program. It is not intended to diagnose MRSA infection nor to guide or monitor treatment for MRSA infections. RESULT CALLED TO, READ BACK BY AND VERIFIED WITH: ROBERTS,T ON 01/21/2016 AT 2255 BY LOY,C   Culture, blood (Routine X 2) w Reflex to ID Panel     Status: None   Collection Time:  02/09/16  8:24 AM  Result Value Ref Range Status   Specimen Description BLOOD LEFT HAND  Final   Special Requests BOTTLES DRAWN AEROBIC ONLY 6CC  Final   Culture NO GROWTH 5 DAYS  Final   Report Status 02/14/2016 FINAL  Final  Culture, blood (Routine X 2) w Reflex to ID Panel     Status: None   Collection Time: 02/09/16  9:31 AM  Result Value Ref Range Status   Specimen Description BLOOD RIGHT HAND  Final   Special Requests BOTTLES DRAWN AEROBIC AND ANAEROBIC 6cc  Final   Culture NO GROWTH 5 DAYS  Final   Report Status 02/14/2016 FINAL  Final  Culture, blood (Routine X 2) w Reflex to ID Panel     Status: None (Preliminary result)   Collection Time: 02/16/16  7:56 AM  Result Value Ref Range Status   Specimen Description BLOOD RIGHT HAND  Final   Special Requests BOTTLES DRAWN AEROBIC AND ANAEROBIC 6CC  Final   Culture NO GROWTH 1 DAY  Final   Report Status PENDING  Incomplete  Culture, blood (Routine X 2) w Reflex to ID Panel     Status: None (Preliminary result)    Collection Time: 02/16/16 10:01 AM  Result Value Ref Range Status   Specimen Description BLOOD RIGHT HAND  Final   Special Requests BOTTLES DRAWN AEROBIC ONLY 6CC  Final   Culture NO GROWTH < 24 HOURS  Final   Report Status PENDING  Incomplete     Scheduled Meds: . chlorhexidine gluconate (MEDLINE KIT)  15 mL Mouth Rinse BID  . Chlorhexidine Gluconate Cloth  6 each Topical Daily  . enoxaparin (LOVENOX) injection  40 mg Subcutaneous Q24H  . famotidine (PEPCID) IV  20 mg Intravenous Q12H  . fentaNYL (SUBLIMAZE) injection  50 mcg Intravenous Once  . furosemide  40 mg Intravenous Q12H  . ipratropium-albuterol  3 mL Nebulization Q6H  . mouth rinse  15 mL Mouth Rinse QID  . methylPREDNISolone (SOLU-MEDROL) injection  60 mg Intravenous Q6H  . nicotine  21 mg Transdermal Daily  . potassium chloride  40 mEq Oral Daily  . propofol  20 mg Intravenous Once  . propofol (DIPRIVAN) infusion  5-80 mcg/kg/min Intravenous Once  . sodium chloride flush  10-40 mL Intracatheter Q12H   Continuous Infusions: . feeding supplement (VITAL AF 1.2 CAL) 1,000 mL (02/16/16 0814)  . fentaNYL infusion INTRAVENOUS 200 mcg/hr (02/17/16 0141)  . propofol (DIPRIVAN) infusion 30.059 mcg/kg/min (02/17/16 0809)    Procedures/Studies: Dg Abd 1 View  Result Date: 02/10/2016 CLINICAL DATA:  76 year old male are with abdominal distention and concern for small bowel obstruction. Decreased bowel sounds. EXAM: ABDOMEN - 1 VIEW COMPARISON:  None. FINDINGS: An enteric tube is partially visualized with tip in the proximal stomach. The side port of the enteric tube appears to be at the level of the gastroesophageal junction. There is no bowel dilatation or evidence of obstruction. Air is noted within the stomach and colon. There is thickened appearance of the gastric wall which may be artifactual. Clinical correlation is recommended to evaluate for gastritis. No free air or radiopaque calculi identified. There is degenerative  changes of the spine. No acute osseous pathology identified. IMPRESSION: No bowel dilatation or evidence of obstruction. Enteric tube with tip in the proximal stomach. Artifact versus less likely thickening of the gastric wall. Clinical correlation is recommended to evaluate for gastritis. Electronically Signed   By: Anner Crete M.D.   On: 02/10/2016 23:55  Ct Head Wo Contrast  Result Date: 01/23/2016 CLINICAL DATA:  Unresponsive.  Possible unintentional overdose. EXAM: CT HEAD WITHOUT CONTRAST TECHNIQUE: Contiguous axial images were obtained from the base of the skull through the vertex without intravenous contrast. COMPARISON:  Head CT 04/19/2014 and MRI brain 04/19/2014 FINDINGS: Brain: Stable age related cerebral atrophy, ventriculomegaly and periventricular white matter disease. No extra-axial fluid collections are identified. No CT findings for acute hemispheric infarction or intracranial hemorrhage. No mass lesions. The brainstem and cerebellum are normal. Vascular: Stable vascular calcifications. No hyperdense vessels or obvious aneurysm. Skull: No skull fracture or bone lesions. Sinuses/Orbits: Extensive chronic paranasal sinus disease mainly involving the ethmoid air cells, left maxillary sinus and left half of the sphenoid sinus. The mastoid air cells and middle ear cavities are clear. The globes are intact. Other: No scalp lesions or scalp hematoma. IMPRESSION: 1. Age related cerebral atrophy, ventriculomegaly and periventricular white matter disease, unchanged when compared to prior examination. 2. No acute intracranial findings or mass lesions. 3. Chronic sinus disease. Electronically Signed   By: Marijo Sanes M.D.   On: 02/01/2016 17:00   Dg Chest Port 1 View  Result Date: 02/16/2016 CLINICAL DATA:  Respiratory failure. EXAM: PORTABLE CHEST 1 VIEW COMPARISON:  02/13/2016 . FINDINGS: Endotracheal tube and NG tube in stable position. Mild right base infiltrate cannot be excluded. No  pleural effusion or pneumothorax. No acute bony abnormality . IMPRESSION: 1.  Lines and tubes in stable position. 2.  Mild right base infiltrate cannot be excluded . Electronically Signed   By: Marcello Moores  Register   On: 02/16/2016 07:32   Dg Chest Port 1 View  Result Date: 02/13/2016 CLINICAL DATA:  Follow-up chest x-ray, ventilated patient. History of COPD, acute on chronic respiratory failure current smoker. EXAM: PORTABLE CHEST 1 VIEW COMPARISON:  Portable chest x-ray of February 12, 2016 FINDINGS: The patient is rotated toward the right. The lungs are hyperinflated. There is no focal infiltrate. There is a trace of blunting of the costophrenic angles. The lung markings are coarse in the right upper lobe. There is no pneumothorax nor pneumomediastinum. The heart and pulmonary vascularity are normal. The endotracheal tube tip lies 6 cm above the carina. The esophagogastric tube tip projects below the inferior margin of the image. IMPRESSION: COPD, coarse right upper lobe interstitial markings may reflect subsegmental atelectasis or fibrosis. Trace bilateral pleural effusions. No alveolar pneumonia nor pulmonary edema. The support tubes are in reasonable position. Electronically Signed   By: Salley Boxley  [place] M.D.   On: 02/13/2016 07:33   Dg Chest Port 1 View  Result Date: 02/12/2016 CLINICAL DATA:  Shortness of Breath EXAM: PORTABLE CHEST 1 VIEW COMPARISON:  02/11/2016 FINDINGS: There is hyperinflation of the lungs compatible with COPD. Endotracheal tube and NG tube are unchanged. No confluent opacities or effusions. Heart is normal size. IMPRESSION: COPD.  No acute findings. Electronically Signed   By: Rolm Baptise M.D.   On: 02/12/2016 07:52   Dg Chest Port 1 View  Result Date: 02/11/2016 CLINICAL DATA:  Short of breath. Ventilated patient. Subsequent encounter. EXAM: PORTABLE CHEST 1 VIEW COMPARISON:  02/10/2016 FINDINGS: Endotracheal tube and nasal/orogastric tube are stable. Lungs are hyperexpanded. There  changes of emphysema with decrease markings in the peripheral lungs most evident on the left. No evidence of pneumonia or pulmonary edema. No pleural effusion or pneumothorax. IMPRESSION: 1. No acute findings in the lungs. 2. COPD. 3. Support apparatus is stable and well positioned. Electronically Signed   By: Lajean Manes  M.D.   On: 02/11/2016 08:04   Dg Chest Port 1 View  Result Date: 02/10/2016 CLINICAL DATA:  Ventilator dependent EXAM: PORTABLE CHEST 1 VIEW COMPARISON:  02/09/2016 FINDINGS: Endotracheal tube and nasogastric catheter are again seen and stable. Cardiac shadow is within normal limits. Previously seen patchy changes in the right upper lobe have resolved in the interval. No new focal infiltrate is seen. No bony abnormality is noted. IMPRESSION: No acute abnormality seen. Tubes and lines as described. Electronically Signed   By: Inez Catalina M.D.   On: 02/10/2016 09:01   Dg Chest Port 1 View  Result Date: 02/09/2016 CLINICAL DATA:  Irregular heartbeat.  Chest pain . EXAM: PORTABLE CHEST 1 VIEW COMPARISON:  01/30/2016. FINDINGS: Endotracheal tube noted with tip 3 cm above the carina. NG tube noted with tip below left hemidiaphragm. Mild infiltrate in the right upper lobe. Questionable nodular density is noted in the right apex. Small bilateral pleural effusions. Apical pleural-parenchymal thickening and cystic changes again noted on the right. These changes are consistent with scarring. No pneumothorax. Heart size stable. IMPRESSION: 1.  Endotracheal tube and NG tube in good anatomic position. 2. Questionable nodule right upper lobe. Questionable right upper lobe infiltrate. Follow-up chest x-rays recommended to demonstrate clearing. Electronically Signed   By: Marcello Moores  Register   On: 02/09/2016 08:11   Dg Chest Port 1 View  Result Date: 01/20/2016 CLINICAL DATA:  Respiratory failure EXAM: PORTABLE CHEST 1 VIEW COMPARISON:  10/27/2015 FINDINGS: Endotracheal tube placed. Tip is 4.5 cm from  the carina. NG tube tip is beyond the gastroesophageal junction. Lungs are hyperaerated. New nodular density at the right apex measures less than 1 cm. No pneumothorax. Small right pleural effusion. IMPRESSION: Endotracheal and NG tubes placed. Subcentimeter nodule at the right apex has developed. Small right pleural effusion. Electronically Signed   By: Marybelle Killings M.D.   On: 01/22/2016 16:09    Gwenivere Hiraldo, DO  [facility] Pager [phone]  If 7PM-7AM, please contact night-coverage [email or web address] Password [email or web address] 02/17/2016, 11:12 AM   LOS: 10 days

## 2016-02-17 NOTE — Progress Notes (Signed)
Subjective: Little change. He remains intubated and on the ventilator. He's having fever. He had an episode of tachycardia yesterday.  Objective: Vital signs in last 24 hours: Temp:  [100.4 F (38 C)-101 F (38.3 C)] 101 F (38.3 C) (02/02 0600) Pulse Rate:  [79-126] 100 (02/02 0600) Resp:  [16-20] 20 (02/02 0600) BP: (86-159)/(53-114) 109/71 (02/02 0600) SpO2:  [91 %-100 %] 98 % (02/02 0600) FiO2 (%):  [30 %] 30 % (02/02 0303) Weight:  [62.6 kg (138 lb 0.1 oz)] 62.6 kg (138 lb 0.1 oz) (02/02 0500) Weight change: -2.2 kg (-4 lb 13.6 oz) Last BM Date: 02/16/16  Intake/Output from previous day: 02/01 0701 - 02/02 0700 In: 1131.6 [I.V.:681.6; IV Piggyback:450] Out: 3100 [Urine:3100]  PHYSICAL EXAM General appearance: alert Resp: rhonchi bilaterally Cardio: regular rate and rhythm, S1, S2 normal, no murmur, click, rub or gallop GI: soft, non-tender; bowel sounds normal; no masses,  no organomegaly Extremities: extremities normal, atraumatic, no cyanosis or edema He seems a little less edematous  Lab Results:  Results for orders placed or performed during the hospital encounter of 02/14/2016 (from the past 48 hour(s))  Protein / creatinine ratio, urine     Status: None   Collection Time: 02/15/16  8:09 AM  Result Value Ref Range   Creatinine, Urine <10 mg/dL   Total Protein, Urine <6 mg/dL    Comment: NO NORMAL RANGE ESTABLISHED FOR THIS TEST   Protein Creatinine Ratio        0.00 - 0.15 mg/mg[Cre]    Comment: RESULT BELOW REPORTABLE RANGE, UNABLE TO CALCULATE.   Glucose, capillary     Status: Abnormal   Collection Time: 02/15/16 11:10 AM  Result Value Ref Range   Glucose-Capillary 186 (H) 65 - 99 mg/dL  Glucose, capillary     Status: Abnormal   Collection Time: 02/15/16  4:19 PM  Result Value Ref Range   Glucose-Capillary 169 (H) 65 - 99 mg/dL   Comment 1 Notify RN    Comment 2 Document in Chart   Glucose, capillary     Status: Abnormal   Collection Time: 02/15/16   7:22 PM  Result Value Ref Range   Glucose-Capillary 190 (H) 65 - 99 mg/dL  Glucose, capillary     Status: Abnormal   Collection Time: 02/16/16  1:19 AM  Result Value Ref Range   Glucose-Capillary 169 (H) 65 - 99 mg/dL  Procalcitonin     Status: None   Collection Time: 02/16/16  4:21 AM  Result Value Ref Range   Procalcitonin <0.10 ng/mL  Basic metabolic panel     Status: Abnormal   Collection Time: 02/16/16  4:21 AM  Result Value Ref Range   Sodium 153 (H) 135 - 145 mmol/L   Potassium 3.0 (L) 3.5 - 5.1 mmol/L   Chloride 114 (H) 101 - 111 mmol/L   CO2 30 22 - 32 mmol/L   Glucose, Bld 175 (H) 65 - 99 mg/dL   BUN 49 (H) 6 - 20 mg/dL   Creatinine, Ser 1.42 (H) 0.61 - 1.24 mg/dL   Calcium 7.6 (L) 8.9 - 10.3 mg/dL   GFR calc non Af Amer 47 (L) >60 mL/min   GFR calc Af Amer 54 (L) >60 mL/min    Comment: (NOTE) The eGFR has been calculated using the CKD EPI equation. This calculation has not been validated in all clinical situations. eGFR's persistently <60 mL/min signify possible Chronic Kidney Disease.    Anion gap 9 5 - 15  Magnesium  Status: None   Collection Time: 02/16/16  4:21 AM  Result Value Ref Range   Magnesium 2.2 1.7 - 2.4 mg/dL  Triglycerides     Status: Abnormal   Collection Time: 02/16/16  4:21 AM  Result Value Ref Range   Triglycerides 210 (H) <150 mg/dL  Glucose, capillary     Status: Abnormal   Collection Time: 02/16/16  4:24 AM  Result Value Ref Range   Glucose-Capillary 165 (H) 65 - 99 mg/dL  Blood gas, arterial     Status: Abnormal   Collection Time: 02/16/16  5:48 AM  Result Value Ref Range   FIO2 30.00    Delivery systems VENTILATOR    Mode PRESSURE REGULATED VOLUME CONTROL    VT 550 mL   LHR 20 resp/min   Peep/cpap 5.0 cm H20   pH, Arterial 7.462 (H) 7.350 - 7.450   pCO2 arterial 42.6 32.0 - 48.0 mmHg   pO2, Arterial 90.8 83.0 - 108.0 mmHg   Bicarbonate 29.8 (H) 20.0 - 28.0 mmol/L   Acid-Base Excess 6.1 (H) 0.0 - 2.0 mmol/L   O2 Saturation  96.8 %   Patient temperature 37.0    Collection site RIGHT RADIAL    Drawn by [id number]    Sample type ARTERIAL DRAW    Allens test (pass/fail) POSITIVE (A) PASS  Urinalysis, Routine w reflex microscopic     Status: Abnormal   Collection Time: 02/16/16  7:39 AM  Result Value Ref Range   Color, Urine YELLOW YELLOW   APPearance CLEAR CLEAR   Specific Gravity, Urine 1.015 1.005 - 1.030   pH 5.0 5.0 - 8.0   Glucose, UA NEGATIVE NEGATIVE mg/dL   Hgb urine dipstick SMALL (A) NEGATIVE   Bilirubin Urine NEGATIVE NEGATIVE   Ketones, ur NEGATIVE NEGATIVE mg/dL   Protein, ur NEGATIVE NEGATIVE mg/dL   Nitrite NEGATIVE NEGATIVE   Leukocytes, UA NEGATIVE NEGATIVE   RBC / HPF 0-5 0 - 5 RBC/hpf   WBC, UA 0-5 0 - 5 WBC/hpf   Bacteria, UA NONE SEEN NONE SEEN  Culture, blood (Routine X 2) w Reflex to ID Panel     Status: None (Preliminary result)   Collection Time: 02/16/16  7:56 AM  Result Value Ref Range   Specimen Description BLOOD RIGHT HAND    Special Requests BOTTLES DRAWN AEROBIC AND ANAEROBIC 6CC    Culture PENDING    Report Status PENDING   Glucose, capillary     Status: Abnormal   Collection Time: 02/16/16  8:10 AM  Result Value Ref Range   Glucose-Capillary 164 (H) 65 - 99 mg/dL   Comment 1 Notify RN    Comment 2 Document in Chart   Culture, blood (Routine X 2) w Reflex to ID Panel     Status: None (Preliminary result)   Collection Time: 02/16/16 10:01 AM  Result Value Ref Range   Specimen Description BLOOD RIGHT HAND    Special Requests BOTTLES DRAWN AEROBIC ONLY 6CC    Culture PENDING    Report Status PENDING   Glucose, capillary     Status: Abnormal   Collection Time: 02/16/16 11:52 AM  Result Value Ref Range   Glucose-Capillary 193 (H) 65 - 99 mg/dL   Comment 1 Notify RN    Comment 2 Document in Chart   Glucose, capillary     Status: Abnormal   Collection Time: 02/16/16  3:40 PM  Result Value Ref Range   Glucose-Capillary 181 (H) 65 - 99 mg/dL  Glucose, capillary        Status: Abnormal   Collection Time: 02/16/16  7:36 PM  Result Value Ref Range   Glucose-Capillary 170 (H) 65 - 99 mg/dL  Glucose, capillary     Status: Abnormal   Collection Time: 02/17/16 12:05 AM  Result Value Ref Range   Glucose-Capillary 216 (H) 65 - 99 mg/dL  Glucose, capillary     Status: Abnormal   Collection Time: 02/17/16  3:56 AM  Result Value Ref Range   Glucose-Capillary 190 (H) 65 - 99 mg/dL  Basic metabolic panel     Status: Abnormal   Collection Time: 02/17/16  4:16 AM  Result Value Ref Range   Sodium 152 (H) 135 - 145 mmol/L   Potassium 3.3 (L) 3.5 - 5.1 mmol/L   Chloride 112 (H) 101 - 111 mmol/L   CO2 30 22 - 32 mmol/L   Glucose, Bld 194 (H) 65 - 99 mg/dL   BUN 60 (H) 6 - 20 mg/dL   Creatinine, Ser 1.56 (H) 0.61 - 1.24 mg/dL   Calcium 7.5 (L) 8.9 - 10.3 mg/dL   GFR calc non Af Amer 42 (L) >60 mL/min   GFR calc Af Amer 48 (L) >60 mL/min    Comment: (NOTE) The eGFR has been calculated using the CKD EPI equation. This calculation has not been validated in all clinical situations. eGFR's persistently <60 mL/min signify possible Chronic Kidney Disease.    Anion gap 10 5 - 15  Procalcitonin     Status: None   Collection Time: 02/17/16  4:16 AM  Result Value Ref Range   Procalcitonin <0.10 ng/mL    Comment:        Interpretation: PCT (Procalcitonin) <= 0.5 ng/mL: Systemic infection (sepsis) is not likely. Local bacterial infection is possible. (NOTE)         ICU PCT Algorithm               Non ICU PCT Algorithm    ----------------------------     ------------------------------         PCT < 0.25 ng/mL                 PCT < 0.1 ng/mL     Stopping of antibiotics            Stopping of antibiotics       strongly encouraged.               strongly encouraged.    ----------------------------     ------------------------------       PCT level decrease by               PCT < 0.25 ng/mL       >= 80% from peak PCT       OR PCT 0.25 - 0.5 ng/mL          Stopping of  antibiotics                                             encouraged.     Stopping of antibiotics           encouraged.    ----------------------------     ------------------------------       PCT level decrease by              PCT >= 0.25 ng/mL       < 80% from peak PCT  AND PCT >= 0.5 ng/mL            Continuin g antibiotics                                              encouraged.       Continuing antibiotics            encouraged.    ----------------------------     ------------------------------     PCT level increase compared          PCT > 0.5 ng/mL         with peak PCT AND          PCT >= 0.5 ng/mL             Escalation of antibiotics                                          strongly encouraged.      Escalation of antibiotics        strongly encouraged.   Glucose, capillary     Status: Abnormal   Collection Time: 02/17/16  7:34 AM  Result Value Ref Range   Glucose-Capillary 191 (H) 65 - 99 mg/dL    ABGS  Recent Labs  02/16/16 0548  PHART 7.462*  PO2ART 90.8  HCO3 29.8*   CULTURES Recent Results (from the past 240 hour(s))  MRSA PCR Screening     Status: Abnormal   Collection Time: 02/03/2016  8:30 PM  Result Value Ref Range Status   MRSA by PCR POSITIVE (A) NEGATIVE Final    Comment:        The GeneXpert MRSA Assay (FDA approved for NASAL specimens only), is one component of a comprehensive MRSA colonization surveillance program. It is not intended to diagnose MRSA infection nor to guide or monitor treatment for MRSA infections. RESULT CALLED TO, READ BACK BY AND VERIFIED WITH: ROBERTS,T ON 02/11/2016 AT 2255 BY LOY,C   Culture, blood (Routine X 2) w Reflex to ID Panel     Status: None   Collection Time: 02/09/16  8:24 AM  Result Value Ref Range Status   Specimen Description BLOOD LEFT HAND  Final   Special Requests BOTTLES DRAWN AEROBIC ONLY [place]  Final   Culture NO GROWTH 5 DAYS  Final   Report Status 02/14/2016 FINAL  Final  Culture, blood  (Routine X 2) w Reflex to ID Panel     Status: None   Collection Time: 02/09/16  9:31 AM  Result Value Ref Range Status   Specimen Description BLOOD RIGHT HAND  Final   Special Requests BOTTLES DRAWN AEROBIC AND ANAEROBIC 6cc  Final   Culture NO GROWTH 5 DAYS  Final   Report Status 02/14/2016 FINAL  Final  Culture, blood (Routine X 2) w Reflex to ID Panel     Status: None (Preliminary result)   Collection Time: 02/16/16  7:56 AM  Result Value Ref Range Status   Specimen Description BLOOD RIGHT HAND  Final   Special Requests BOTTLES DRAWN AEROBIC AND ANAEROBIC 6CC  Final   Culture PENDING  Incomplete   Report Status PENDING  Incomplete  Culture, blood (Routine X 2) w Reflex to ID Panel     Status: None (Preliminary result)  Collection Time: 02/16/16 10:01 AM  Result Value Ref Range Status   Specimen Description BLOOD RIGHT HAND  Final   Special Requests BOTTLES DRAWN AEROBIC ONLY 6CC  Final   Culture PENDING  Incomplete   Report Status PENDING  Incomplete   Studies/Results: Dg Chest Port 1 View  Result Date: 02/16/2016 CLINICAL DATA:  Respiratory failure. EXAM: PORTABLE CHEST 1 VIEW COMPARISON:  02/13/2016 . FINDINGS: Endotracheal tube and NG tube in stable position. Mild right base infiltrate cannot be excluded. No pleural effusion or pneumothorax. No acute bony abnormality . IMPRESSION: 1.  Lines and tubes in stable position. 2.  Mild right base infiltrate cannot be excluded . Electronically Signed   By: Thomas  Register   On: 02/16/2016 07:32    Medications:  Prior to Admission:  Prescriptions Prior to Admission  Medication Sig Dispense Refill Last Dose  . albuterol (PROVENTIL HFA;VENTOLIN HFA) 108 (90 Base) MCG/ACT inhaler Inhale 1-2 puffs into the lungs every 6 (six) hours as needed for wheezing or shortness of breath. 1 Inhaler 0 Past Week at Unknown time  . albuterol (PROVENTIL) (2.5 MG/3ML) 0.083% nebulizer solution Take 3 mLs (2.5 mg total) by nebulization every 6 (six) hours  as needed for wheezing or shortness of breath. 150 mL 1 Past Week at Unknown time  . ALPRAZolam (XANAX) 1 MG tablet Take 1 tablet (1 mg total) by mouth 3 (three) times daily as needed for anxiety. 90 tablet 1 Past Week at Unknown time  . amLODipine (NORVASC) 10 MG tablet Take 10 mg by mouth daily.   Past Week at Unknown time  . budesonide-formoterol (SYMBICORT) 160-4.5 MCG/ACT inhaler Inhale 2 puffs into the lungs 2 (two) times daily.   Past Week at Unknown time  . FLUoxetine (PROZAC) 40 MG capsule Take 1 capsule (40 mg total) by mouth daily. 30 capsule 3 Past Week at Unknown time  . ibuprofen (ADVIL,MOTRIN) 200 MG tablet Take 200 mg by mouth every 6 (six) hours as needed for headache, mild pain or moderate pain. For pain    Past Week at Unknown time  . Ivermectin 0.5 % LOTN Apply thoroughly to dry hair and scalp; leave lotion on hair for 10 minutes, and then rinse with water. 1 Tube 1 Past Week at Unknown time  . loratadine (CLARITIN) 10 MG tablet Take 1 tablet (10 mg total) by mouth daily. 30 tablet 11 Past Month at Unknown time  . omeprazole (PRILOSEC) 20 MG capsule take 1 capsule by mouth once daily 30 capsule 3 Past Month at Unknown time  . propranolol (INDERAL) 10 MG tablet take 1 tablet by mouth twice a day for TREMOR 60 tablet 3 Past Week at Unknown time  . rosuvastatin (CRESTOR) 10 MG tablet take 1 tablet by mouth once daily 30 tablet 3 Past Week at Unknown time  . tiotropium (SPIRIVA) 18 MCG inhalation capsule Place 18 mcg into inhaler and inhale daily.   Past Week at Unknown time  . vitamin B-12 (CYANOCOBALAMIN) 1000 MCG tablet Take 1,000 mcg by mouth daily.   Past Week at Unknown time  . nitroGLYCERIN (NITROSTAT) 0.4 MG SL tablet Place 0.4 mg under the tongue every 5 (five) minutes as needed for chest pain.   unknown  . [DISCONTINUED] predniSONE (DELTASONE) 20 MG tablet 2 tabs po daily x 4 days 8 tablet 0    Scheduled: . chlorhexidine gluconate (MEDLINE KIT)  15 mL Mouth Rinse BID  .  enoxaparin (LOVENOX) injection  40 mg Subcutaneous Q24H  . famotidine (  PEPCID) IV  20 mg Intravenous Q12H  . fentaNYL (SUBLIMAZE) injection  50 mcg Intravenous Once  . furosemide  40 mg Intravenous Q12H  . ipratropium-albuterol  3 mL Nebulization Q6H  . mouth rinse  15 mL Mouth Rinse QID  . methylPREDNISolone (SOLU-MEDROL) injection  60 mg Intravenous Q6H  . nicotine  21 mg Transdermal Daily  . piperacillin-tazobactam (ZOSYN)  IV  3.375 g Intravenous Q8H  . potassium chloride  40 mEq Oral Daily  . propofol  20 mg Intravenous Once  . propofol (DIPRIVAN) infusion  5-80 mcg/kg/min Intravenous Once  . vancomycin  1,000 mg Intravenous Q24H   Continuous: . feeding supplement (VITAL AF 1.2 CAL) 1,000 mL (02/16/16 0814)  . fentaNYL infusion INTRAVENOUS 200 mcg/hr (02/17/16 0141)  . propofol (DIPRIVAN) infusion 30 mcg/kg/min (02/16/16 2336)   PRN:acetaminophen, albuterol, bisacodyl, fentaNYL, sodium chloride  Assesment:He is admitted with COPD exacerbation and acute on chronic hypoxic and hypercapnic respiratory failure requiring ventilator support. He is now 10 days into this. He's been on antibiotics. He is having some fever again. Weaning trials have failed. Family is struggling with a decision about what to do. He likely had some element of heart failure. He at least had some volume overload and that seems to be better. We had trouble with his tube feedings but that's going better now. Principal Problem:   COPD exacerbation (HCC) Active Problems:   COPD (chronic obstructive pulmonary disease) (HCC)   Essential hypertension, benign   Respiratory failure (HCC)   Acute on chronic respiratory failure with hypercapnia (HCC)   Respiratory disorder with ventilator dependence (HCC)   Fever   Acute bronchitis   Palliative care encounter   Goals of care, counseling/discussion   DNR (do not resuscitate) discussion   Ventilator dependence (HCC)   Acute respiratory failure with hypoxia and  hypercarbia (HCC)   Elevated troponin    Plan: Continue current treatments. Agree if were not able to get him off the ventilator by Monday the fifth we are at a decision point in his care.    LOS: 10 days   , L 02/17/2016, 8:04 AM  

## 2016-02-17 NOTE — Progress Notes (Signed)
Inpatient Diabetes Program Recommendations  AACE/ADA: New Consensus Statement on Inpatient Glycemic Control (2015)  Target Ranges:  Prepandial:   less than 140 mg/dL      Peak postprandial:   less than 180 mg/dL (1-2 hours)      Critically ill patients:  140 - 180 mg/dL   Results for BRYNNER, DORAIS (MRN [id number]) as of 02/17/2016 11:53  Ref. Range 02/17/2016 00:05 02/17/2016 03:56 02/17/2016 07:34 02/17/2016 11:39  Glucose-Capillary Latest Ref Range: 65 - 99 mg/dL 216 (H) 190 (H) 191 (H) 212 (H)    Admit: Dyspnea  NO History of DM noted.      MD- Note patient currently getting Solumedrol 60 mg Q6 hours.  Having glucose elevations.  If within goals of care, Please consider starting Novolog Sensitive Correction Scale/ SSI (0-9 units) Q4 hours      --Will follow patient during hospitalization--  Wyn Quaker RN, MSN, CDE Diabetes Coordinator Inpatient Glycemic Control Team Team Pager: [phone] (8a-5p)

## 2016-02-17 NOTE — Progress Notes (Signed)
Daily Progress Note   Patient Name: [name]       Date: 02/17/2016 DOB: October 13, 1940  Age: 76 y.o. MRN#: [id number] Attending Physician: Orson Eva, MD Primary Care Physician: Vic Blackbird, MD Admit Date: 01/16/2016  Reason for Consultation/Follow-up: Disposition, Establishing goals of care, Psychosocial/spiritual support and Withdrawal of life-sustaining treatment  Subjective: Mr. Dejaynes is lying quietly in bed. He is sedated/intubated/ventilated. No family at bedside at this time. Call to pulmonologist, Dr. Luan Pulling. We discuss possible outcomes for Mr. Poythress.  Dr. Luan Pulling recommendation to consider extubation without re-intubation. He shares his concerns over Mr. Tipps outcomes related to his poor functional status prior to this hospitalization.  Call to wife [name]. We share Mr. Dahm inability to wean over the last few days, and now fever of unknown origin. I share that he is being treated with IV antibiotics, and that we are doing all that we can for him. She states that the family has talked a little about outcomes, but they were waiting on today's call to continue discussions. Tye [place] states that at this point, she is still leaning against trach.  She agrees to a family meeting at bedside Monday 2/5 at 2 PM for final decisions regarding transfer to [facility] for trach and PEG, or extubate without reintubation.  Call to son Joyce Gross. I share information regarding inability to wean over the last few days, and now fever of unknown origin. I share with him also that we are continuing to treat the treatable, searching for sources of infection. He agrees to be present for Monday's meeting to discuss future choices for his father. Joyce Gross states his desire is for his father to pass on his own if  he is going to die (without family being forced to make decisions). He talks about unburdening the family from having to decide. I share that it would not surprise me if Mr. Hausen passes or is able to survive until Monday (either outcome). I share that he is very ill. J states that he also is leaning against trach. I share recommendations from Dr. Luan Pulling and the entire medical team, that a tracheostomy would not provide quality of life for Mr. Sanguinetti. Joyce Gross shares his worry over end-of-life for his father, asking what it will look like. We talk about the use of medications for symptom management, that  our goal would be to make Mr. Purdy as comfortable as possible, including terminal sedation if needed, if the choice is for extubation without reading intubation.   Length of Stay: 10  Current Medications: Scheduled Meds:  . chlorhexidine gluconate (MEDLINE KIT)  15 mL Mouth Rinse BID  . Chlorhexidine Gluconate Cloth  6 each Topical Daily  . enoxaparin (LOVENOX) injection  40 mg Subcutaneous Q24H  . famotidine (PEPCID) IV  20 mg Intravenous Q12H  . fentaNYL (SUBLIMAZE) injection  50 mcg Intravenous Once  . furosemide  40 mg Intravenous Q12H  . ipratropium-albuterol  3 mL Nebulization Q6H  . mouth rinse  15 mL Mouth Rinse QID  . methylPREDNISolone (SOLU-MEDROL) injection  60 mg Intravenous Q6H  . nicotine  21 mg Transdermal Daily  . potassium chloride  40 mEq Oral Daily  . propofol  20 mg Intravenous Once  . propofol (DIPRIVAN) infusion  5-80 mcg/kg/min Intravenous Once  . sodium chloride flush  10-40 mL Intracatheter Q12H    Continuous Infusions: . feeding supplement (VITAL AF 1.2 CAL) 1,000 mL (02/16/16 0814)  . fentaNYL infusion INTRAVENOUS 200 mcg/hr (02/17/16 0141)  . propofol (DIPRIVAN) infusion 30.059 mcg/kg/min (02/17/16 0809)    PRN Meds: acetaminophen, albuterol, bisacodyl, fentaNYL, sodium chloride, sodium chloride flush  Physical Exam  Constitutional: No distress.    Sedated, frail and thin, acutely/chronically ill appearing  HENT:  Head: Normocephalic and atraumatic.  Cardiovascular:  Rate 110's  Pulmonary/Chest:  Intubated/ventilated/sedated  Abdominal: Soft. He exhibits no distension.  Musculoskeletal:  Frail and thin, temporal wasting, poor functional status  Neurological:  Sedated/vented  Skin: Skin is warm and dry.  Nursing note and vitals reviewed.           Vital Signs: BP 98/67   Pulse (!) 108   Temp (!) 101.6 F (38.7 C)   Resp 20   Ht _0  (1.727 m)   Wt 62.6 kg (138 lb 0.1 oz)   SpO2 97%   BMI 20.98 kg/m  SpO2: SpO2: 97 % O2 Device: O2 Device: Ventilator O2 Flow Rate: O2 Flow Rate (L/min): 30 L/min  Intake/output summary:  Intake/Output Summary (Last 24 hours) at 02/17/16 1227 Last data filed at 02/17/16 0809  Gross per 24 hour  Intake          1222.88 ml  Output             2650 ml  Net         -1427.12 ml   LBM: Last BM Date: 02/16/16 Baseline Weight: Weight: 59.9 kg (132 lb) Most recent weight: Weight: 62.6 kg (138 lb 0.1 oz)       Palliative Assessment/Data:    Flowsheet Rows   Flowsheet Row Most Recent Value  Intake Tab  Referral Department  Hospitalist  Unit at Time of Referral  ICU  Palliative Care Primary Diagnosis  Pulmonary  Date Notified  02/09/16  Palliative Care Type  New Palliative care  Reason for referral  Clarify Goals of Care  Date of Admission  01/22/2016  Date first seen by Palliative Care  02/09/16  # of days Palliative referral response time  0 Day(s)  # of days IP prior to Palliative referral  2  Clinical Assessment  Palliative Performance Scale Score  10%  Pain Max last 24 hours  Not able to report  Pain Min Last 24 hours  Not able to report  Dyspnea Max Last 24 Hours  Not able to report  Dyspnea Min Last 24 hours  Not able to report  Psychosocial & Spiritual Assessment  Palliative Care Outcomes  Patient/Family meeting held?  Yes  Who was at the meeting?  Wife, Orlando Devereux  Palliative Care Outcomes  Provided psychosocial or spiritual support, Clarified goals of care  Palliative Care follow-up planned  -- [Follow-up while at [place]      Patient Active Problem List   Diagnosis Date Noted  . Acute on chronic respiratory failure with hypoxia and hypercapnia ([place]) 02/17/2016  . FUO (fever of unknown origin)   . Obstructive chronic bronchitis with exacerbation ([place])   . Other abnormal blood chemistry   . Fever, unspecified   . Dependence on respirator, status   . Acute respiratory failure with hypoxia and hypercarbia ([place]) 02/15/2016  . Elevated troponin 02/15/2016  . Ventilator dependence ([place])   . Palliative care encounter   . Goals of care, counseling/discussion   . DNR (do not resuscitate) discussion   . Respiratory failure ([place]) 02/12/2016  . Acute on chronic respiratory failure with hypercapnia ([place]) 02/08/2016  . Respiratory disorder with ventilator dependence ([place]) 01/18/2016  . Fever 02/04/2016  . Acute bronchitis 01/17/2016  . 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 Tremor 07/17/2015  . GERD (gastroesophageal reflux disease) 03/21/2015  . Generalized weakness 04/20/2014  . Gait instability 04/20/2014  . Acute sinusitis 04/20/2014  . COPD exacerbation ([place]) 03/31/2014  . Chronic respiratory failure with hypoxia ([place]) 08/19/2013  . AK (actinic keratosis) 07/29/2013  . Atypical chest pain 07/29/2013  . Decreased hearing 11/03/2012  . Leg cramps 07/04/2012  . OA (osteoarthritis) 04/25/2012  . Impaired mobility 04/25/2012  . Insomnia 03/29/2012  . Tobacco abuse 04/05/2011  . COPD (chronic obstructive pulmonary disease) ([place]) 03/14/2011  . Essential hypertension, benign 03/14/2011  . Hyperlipidemia 03/14/2011  . PVD (peripheral vascular disease) ([place]) 03/14/2011  . Depression with anxiety 03/14/2011  . Joint pain 03/14/2011  . Skin cancer 03/14/2011    Palliative Care Assessment & Plan   Patient Profile: 76  y.o.malewith past medical history of COPD per wife intubated/ventilated 5 times, hypertension and high cholesterol, PVD, anxiety, arthritis, skin canceradmitted on 1/23/2018with acute on chronic ventilator dependent respiratory failure due to COPD exacerbation. He has been unable to wean over the last few days. He is only waiting for a few hours one day  Assessment: Acute on chronic hypoxemic respiratory failure, ventilatory dependent; Ventilator weaning today for 2 hours, continue ventilator support. Family meeting today to discuss possible outcomes. At this point, continue full support, weaning efforts. Family meeting again Monday if no changes.  Functional decline; wife States that Mr. Yurkovich has had functional decline over the last few months, his goal was to get out and exercise more, but he has not been able. She tells a story of him being unable to lift a hammer to complete a project.  Mr. Sherrer is dependent on her for help with bathing. Son Lavella Hammock had stated that he has seen a decline in his father over the last year. SNF for rehab would be appropriate if he qualifies/extubates.  Recommendations/Plan: Continue full ventilator support at this time, continue to treat the treatable.  No CPR or defibrillation. Okay for vase oppressors. At this point, family is leaning against trach.   Continue quality of life discussions.  Goals of Care and Additional Recommendations:  Limitations on Scope of Treatment: No chest compressions or defibrillation. ACLS medications/vasopressin is okay, continued ventilator support okay.  At this point, family is leaning against trach.  Code Status:    Code Status Orders        Start     Ordered   02/15/16 1604  Limited resuscitation (code)  Continuous    Question Answer Comment  In the event of cardiac or respiratory ARREST: Initiate Code Blue, Call Rapid Response No   In the event of cardiac or respiratory ARREST: Perform CPR No   In the event of  cardiac or respiratory ARREST: Perform Intubation/Mechanical Ventilation Yes   In the event of cardiac or respiratory ARREST: Use NIPPV/BiPAp only if indicated Yes   In the event of cardiac or respiratory ARREST: Administer ACLS medications if indicated Yes   In the event of cardiac or respiratory ARREST: Perform Defibrillation or Cardioversion if indicated No      02/15/16 1604    Code Status History    Date Active Date Inactive Code Status Order ID Comments User Context   02/08/2016  9:00 PM 02/15/2016  4:04 PM Full Code [id number]  Roney Jaffe, MD Inpatient   08/18/2013  3:55 PM 08/20/2013  7:03 PM Full Code [id number]  Kinnie Feil, MD Inpatient   04/05/2011  3:46 AM 04/07/2011  7:59 PM Full Code [id number]  Ardath Sax, RN Inpatient       Prognosis:   < 2 weeks, would not be surprising even hours today's if family elects to extubate without really intubation. If family elects tracheotomy, 6 months or less would not be surprising.  Discharge Planning:  To be determined, one way extubation would likely lead to in-hospital death.  Care plan was discussed with nursing staff, case manager, social worker, Dr. Luan Pulling and Dr. Carles Collet.   Thank you for allowing the Palliative Medicine Team to assist in the care of this patient.   Time In: 1015 Time Out: 1120 Total Time 65 minutes Prolonged Time Billed  yes       Greater than 50%  of this time was spent counseling and coordinating care related to the above assessment and plan.  Drue Novel, NP  Please contact Palliative Medicine Team phone at [phone] for questions and concerns.

## 2016-02-18 DIAGNOSIS — R5081 Fever presenting with conditions classified elsewhere: Secondary | ICD-10-CM

## 2016-02-18 DIAGNOSIS — Z9911 Dependence on respirator [ventilator] status: Secondary | ICD-10-CM

## 2016-02-18 DIAGNOSIS — R7401 Elevation of levels of liver transaminase levels: Secondary | ICD-10-CM

## 2016-02-18 DIAGNOSIS — R740 Nonspecific elevation of levels of transaminase and lactic acid dehydrogenase [LDH]: Secondary | ICD-10-CM

## 2016-02-18 LAB — COMPREHENSIVE METABOLIC PANEL
ALT: 83 U/L — ABNORMAL HIGH (ref 17–63)
ANION GAP: 10 (ref 5–15)
AST: 32 U/L (ref 15–41)
Albumin: 2.6 g/dL — ABNORMAL LOW (ref 3.5–5.0)
Alkaline Phosphatase: 43 U/L (ref 38–126)
BILIRUBIN TOTAL: 0.8 mg/dL (ref 0.3–1.2)
BUN: 63 mg/dL — AB (ref 6–20)
CHLORIDE: 110 mmol/L (ref 101–111)
CO2: 33 mmol/L — ABNORMAL HIGH (ref 22–32)
Calcium: 7.1 mg/dL — ABNORMAL LOW (ref 8.9–10.3)
Creatinine, Ser: 1.43 mg/dL — ABNORMAL HIGH (ref 0.61–1.24)
GFR, EST AFRICAN AMERICAN: 54 mL/min — AB (ref >60)
GFR, EST NON AFRICAN AMERICAN: 46 mL/min — AB (ref >60)
Glucose, Bld: 228 mg/dL — ABNORMAL HIGH (ref 65–99)
POTASSIUM: 3.4 mmol/L — AB (ref 3.5–5.1)
Sodium: 153 mmol/L — ABNORMAL HIGH (ref 135–145)
TOTAL PROTEIN: 5 g/dL — AB (ref 6.5–8.1)

## 2016-02-18 LAB — GLUCOSE, CAPILLARY
GLUCOSE-CAPILLARY: 202 mg/dL — AB (ref 65–99)
GLUCOSE-CAPILLARY: 212 mg/dL — AB (ref 65–99)
Glucose-Capillary: 190 mg/dL — ABNORMAL HIGH (ref 65–99)
Glucose-Capillary: 204 mg/dL — ABNORMAL HIGH (ref 65–99)
Glucose-Capillary: 176 mg/dL — ABNORMAL HIGH (ref 65–99)

## 2016-02-18 LAB — CBC WITH DIFFERENTIAL/PLATELET
BASOS ABS: 0 10*3/uL (ref 0.0–0.1)
Basophils Relative: 0 %
EOS PCT: 0 %
Eosinophils Absolute: 0 10*3/uL (ref 0.0–0.7)
HCT: 43.2 % (ref 39.0–52.0)
HEMOGLOBIN: 13.6 g/dL (ref 13.0–17.0)
LYMPHS PCT: 5 %
Lymphs Abs: 0.6 10*3/uL — ABNORMAL LOW (ref 0.7–4.0)
MCH: 31.1 pg (ref 26.0–34.0)
MCHC: 31.5 g/dL (ref 30.0–36.0)
MCV: 98.9 fL (ref 78.0–100.0)
Monocytes Absolute: 0.4 10*3/uL (ref 0.1–1.0)
Monocytes Relative: 3 %
NEUTROS ABS: 11.3 10*3/uL — AB (ref 1.7–7.7)
NEUTROS PCT: 92 %
PLATELETS: 109 10*3/uL — AB (ref 150–400)
RBC: 4.37 MIL/uL (ref 4.22–5.81)
RDW: 15.5 % (ref 11.5–15.5)
WBC: 12.3 10*3/uL — AB (ref 4.0–10.5)

## 2016-02-18 MED ORDER — FENTANYL CITRATE (PF) 2500 MCG/50ML IJ SOLN
INTRAMUSCULAR | Status: AC
  Filled 2016-02-18: qty 50

## 2016-02-18 MED ORDER — INSULIN ASPART 100 UNIT/ML ~~LOC~~ SOLN
0.0000 [IU] | SUBCUTANEOUS | Status: DC
  Administered 2016-02-18: 2 [IU] via SUBCUTANEOUS
  Administered 2016-02-18 – 2016-02-19 (×3): 3 [IU] via SUBCUTANEOUS
  Administered 2016-02-19 – 2016-02-20 (×7): 2 [IU] via SUBCUTANEOUS
  Administered 2016-02-20: 3 [IU] via SUBCUTANEOUS
  Administered 2016-02-20: 2 [IU] via SUBCUTANEOUS

## 2016-02-18 MED ORDER — POLYVINYL ALCOHOL 1.4 % OP SOLN
OPHTHALMIC | Status: AC
  Filled 2016-02-18: qty 15

## 2016-02-18 MED ORDER — POLYVINYL ALCOHOL 1.4 % OP SOLN: 1.0000 [drp] | OPHTHALMIC | Status: DC

## 2016-02-18 MED ORDER — POLYVINYL ALCOHOL 1.4 % OP SOLN
1.0000 [drp] | OPHTHALMIC | Status: DC
  Administered 2016-02-18 – 2016-02-20 (×12): 1 [drp] via OPHTHALMIC
  Filled 2016-02-18 (×50): qty 15

## 2016-02-18 NOTE — Progress Notes (Signed)
Subjective: Weaning attempted again this morning and he failed. He became much more dyspneic and tachycardic.  Objective: Vital signs in last 24 hours: Temp:  [97.5 F (36.4 C)-99.5 F (37.5 C)] 99.5 F (37.5 C) (02/03 0709) Pulse Rate:  [105] 105 (02/02 2008) Resp:  [20] 20 (02/03 0709) SpO2:  [96 %-98 %] 96 % (02/03 0855) FiO2 (%):  [30 %] 30 % (02/03 0855) Weight:  [63.7 kg (140 lb 6.9 oz)] 63.7 kg (140 lb 6.9 oz) (02/03 0400) Weight change: 1.1 kg (2 lb 6.8 oz) Last BM Date: 02/16/16  Intake/Output from previous day: 02/02 0701 - 02/03 0700 In: 645.2 [I.V.:545.2; NG/GT:50; IV Piggyback:50] Out: 2750 [Urine:2750]  PHYSICAL EXAM General appearance: Intubated sedated on mechanical ventilation Resp: rhonchi bilaterally Cardio: regular rate and rhythm, S1, S2 normal, no murmur, click, rub or gallop GI: soft, non-tender; bowel sounds normal; no masses,  no organomegaly Extremities: He still has edema of the extremities Mucous membranes are moist  Lab Results:  Results for orders placed or performed during the hospital encounter of 01/18/2016 (from the past 48 hour(s))  Culture, blood (Routine X 2) w Reflex to ID Panel     Status: None (Preliminary result)   Collection Time: 02/16/16 10:01 AM  Result Value Ref Range   Specimen Description BLOOD RIGHT HAND    Special Requests BOTTLES DRAWN AEROBIC ONLY 6CC    Culture NO GROWTH < 24 HOURS    Report Status PENDING   Glucose, capillary     Status: Abnormal   Collection Time: 02/16/16 11:52 AM  Result Value Ref Range   Glucose-Capillary 193 (H) 65 - 99 mg/dL   Comment 1 Notify RN    Comment 2 Document in Chart   Glucose, capillary     Status: Abnormal   Collection Time: 02/16/16  3:40 PM  Result Value Ref Range   Glucose-Capillary 181 (H) 65 - 99 mg/dL  Glucose, capillary     Status: Abnormal   Collection Time: 02/16/16  7:36 PM  Result Value Ref Range   Glucose-Capillary 170 (H) 65 - 99 mg/dL  Glucose, capillary      Status: Abnormal   Collection Time: 02/17/16 12:05 AM  Result Value Ref Range   Glucose-Capillary 216 (H) 65 - 99 mg/dL  Glucose, capillary     Status: Abnormal   Collection Time: 02/17/16  3:56 AM  Result Value Ref Range   Glucose-Capillary 190 (H) 65 - 99 mg/dL  Basic metabolic panel     Status: Abnormal   Collection Time: 02/17/16  4:16 AM  Result Value Ref Range   Sodium 152 (H) 135 - 145 mmol/L   Potassium 3.3 (L) 3.5 - 5.1 mmol/L   Chloride 112 (H) 101 - 111 mmol/L   CO2 30 22 - 32 mmol/L   Glucose, Bld 194 (H) 65 - 99 mg/dL   BUN 60 (H) 6 - 20 mg/dL   Creatinine, Ser 1.56 (H) 0.61 - 1.24 mg/dL   Calcium 7.5 (L) 8.9 - 10.3 mg/dL   GFR calc non Af Amer 42 (L) >60 mL/min   GFR calc Af Amer 48 (L) >60 mL/min    Comment: (NOTE) The eGFR has been calculated using the CKD EPI equation. This calculation has not been validated in all clinical situations. eGFR's persistently <60 mL/min signify possible Chronic Kidney Disease.    Anion gap 10 5 - 15  Procalcitonin     Status: None   Collection Time: 02/17/16  4:16 AM  Result Value  Ref Range   Procalcitonin <0.10 ng/mL    Comment:        Interpretation: PCT (Procalcitonin) <= 0.5 ng/mL: Systemic infection (sepsis) is not likely. Local bacterial infection is possible. (NOTE)         ICU PCT Algorithm               Non ICU PCT Algorithm    ----------------------------     ------------------------------         PCT < 0.25 ng/mL                 PCT < 0.1 ng/mL     Stopping of antibiotics            Stopping of antibiotics       strongly encouraged.               strongly encouraged.    ----------------------------     ------------------------------       PCT level decrease by               PCT < 0.25 ng/mL       >= 80% from peak PCT       OR PCT 0.25 - 0.5 ng/mL          Stopping of antibiotics                                             encouraged.     Stopping of antibiotics           encouraged.     ----------------------------     ------------------------------       PCT level decrease by              PCT >= 0.25 ng/mL       < 80% from peak PCT        AND PCT >= 0.5 ng/mL            Continuin g antibiotics                                              encouraged.       Continuing antibiotics            encouraged.    ----------------------------     ------------------------------     PCT level increase compared          PCT > 0.5 ng/mL         with peak PCT AND          PCT >= 0.5 ng/mL             Escalation of antibiotics                                          strongly encouraged.      Escalation of antibiotics        strongly encouraged.   Glucose, capillary     Status: Abnormal   Collection Time: 02/17/16  7:34 AM  Result Value Ref Range   Glucose-Capillary 191 (H) 65 - 99 mg/dL  Culture, respiratory (NON-Expectorated)     Status: None (Preliminary result)  Collection Time: 02/17/16 11:21 AM  Result Value Ref Range   Specimen Description TRACHEAL ASPIRATE    Special Requests NONE    Gram Stain      RARE SQUAMOUS EPITHELIAL CELLS PRESENT MODERATE WBC PRESENT,BOTH PMN AND MONONUCLEAR FEW YEAST Performed at [facility], [place] [address], [place], [place] [address]    Culture PENDING    Report Status PENDING   Glucose, capillary     Status: Abnormal   Collection Time: 02/17/16 11:39 AM  Result Value Ref Range   Glucose-Capillary 212 (H) 65 - 99 mg/dL  Comprehensive metabolic panel     Status: Abnormal   Collection Time: 02/18/16  4:55 AM  Result Value Ref Range   Sodium 153 (H) 135 - 145 mmol/L   Potassium 3.4 (L) 3.5 - 5.1 mmol/L   Chloride 110 101 - 111 mmol/L   CO2 33 (H) 22 - 32 mmol/L   Glucose, Bld 228 (H) 65 - 99 mg/dL   BUN 63 (H) 6 - 20 mg/dL   Creatinine, Ser 1.43 (H) 0.61 - 1.24 mg/dL   Calcium 7.1 (L) 8.9 - 10.3 mg/dL   Total Protein 5.0 (L) 6.5 - 8.1 g/dL   Albumin 2.6 (L) 3.5 - 5.0 g/dL   AST 32 15 - 41 U/L   ALT 83 (H) 17 - 63 U/L    Alkaline Phosphatase 43 38 - 126 U/L   Total Bilirubin 0.8 0.3 - 1.2 mg/dL   GFR calc non Af Amer 46 (L) >60 mL/min   GFR calc Af Amer 54 (L) >60 mL/min    Comment: (NOTE) The eGFR has been calculated using the CKD EPI equation. This calculation has not been validated in all clinical situations. eGFR's persistently <60 mL/min signify possible Chronic Kidney Disease.    Anion gap 10 5 - 15  CBC with Differential/Platelet     Status: Abnormal   Collection Time: 02/18/16  4:55 AM  Result Value Ref Range   WBC 12.3 (H) 4.0 - 10.5 K/uL   RBC 4.37 4.22 - 5.81 MIL/uL   Hemoglobin 13.6 13.0 - 17.0 g/dL   HCT 43.2 39.0 - 52.0 %   MCV 98.9 78.0 - 100.0 fL   MCH 31.1 26.0 - 34.0 pg   MCHC 31.5 30.0 - 36.0 g/dL   RDW 15.5 11.5 - 15.5 %   Platelets 109 (L) 150 - 400 K/uL    Comment: SPECIMEN CHECKED FOR CLOTS PLATELET COUNT CONFIRMED BY SMEAR    Neutrophils Relative % 92 %   Neutro Abs 11.3 (H) 1.7 - 7.7 K/uL   Lymphocytes Relative 5 %   Lymphs Abs 0.6 (L) 0.7 - 4.0 K/uL   Monocytes Relative 3 %   Monocytes Absolute 0.4 0.1 - 1.0 K/uL   Eosinophils Relative 0 %   Eosinophils Absolute 0.0 0.0 - 0.7 K/uL   Basophils Relative 0 %   Basophils Absolute 0.0 0.0 - 0.1 K/uL  Glucose, capillary     Status: Abnormal   Collection Time: 02/18/16  7:08 AM  Result Value Ref Range   Glucose-Capillary 190 (H) 65 - 99 mg/dL    ABGS  Recent Labs  02/16/16 0548  PHART 7.462*  PO2ART 90.8  HCO3 29.8*   CULTURES Recent Results (from the past 240 hour(s))  Culture, blood (Routine X 2) w Reflex to ID Panel     Status: None   Collection Time: 02/09/16  8:24 AM  Result Value Ref Range Status   Specimen Description BLOOD  LEFT HAND  Final   Special Requests BOTTLES DRAWN AEROBIC ONLY [place]  Final   Culture NO GROWTH 5 DAYS  Final   Report Status 02/14/2016 FINAL  Final  Culture, blood (Routine X 2) w Reflex to ID Panel     Status: None   Collection Time: 02/09/16  9:31 AM  Result Value Ref Range  Status   Specimen Description BLOOD RIGHT HAND  Final   Special Requests BOTTLES DRAWN AEROBIC AND ANAEROBIC 6cc  Final   Culture NO GROWTH 5 DAYS  Final   Report Status 02/14/2016 FINAL  Final  Urine culture     Status: None   Collection Time: 02/16/16  7:39 AM  Result Value Ref Range Status   Specimen Description URINE, CLEAN CATCH  Final   Special Requests NONE  Final   Culture   Final    NO GROWTH Performed at [facility], [place] [address] [place], [place] [address]    Report Status 02/17/2016 FINAL  Final  Culture, blood (Routine X 2) w Reflex to ID Panel     Status: None (Preliminary result)   Collection Time: 02/16/16  7:56 AM  Result Value Ref Range Status   Specimen Description BLOOD RIGHT HAND  Final   Special Requests BOTTLES DRAWN AEROBIC AND ANAEROBIC 6CC  Final   Culture NO GROWTH 1 DAY  Final   Report Status PENDING  Incomplete  Culture, blood (Routine X 2) w Reflex to ID Panel     Status: None (Preliminary result)   Collection Time: 02/16/16 10:01 AM  Result Value Ref Range Status   Specimen Description BLOOD RIGHT HAND  Final   Special Requests BOTTLES DRAWN AEROBIC ONLY 6CC  Final   Culture NO GROWTH < 24 HOURS  Final   Report Status PENDING  Incomplete  Culture, respiratory (NON-Expectorated)     Status: None (Preliminary result)   Collection Time: 02/17/16 11:21 AM  Result Value Ref Range Status   Specimen Description TRACHEAL ASPIRATE  Final   Special Requests NONE  Final   Gram Stain   Final    RARE SQUAMOUS EPITHELIAL CELLS PRESENT MODERATE WBC PRESENT,BOTH PMN AND MONONUCLEAR FEW YEAST Performed at [facility], [place] [address] [place], [place] [address]    Culture PENDING  Incomplete   Report Status PENDING  Incomplete   Studies/Results: Ct Abdomen Pelvis Wo Contrast  Result Date: 02/17/2016 CLINICAL DATA:  Fever of unknown origin. EXAM: CT CHEST, ABDOMEN AND PELVIS WITHOUT CONTRAST TECHNIQUE: Multidetector CT imaging of the chest,  abdomen and pelvis was performed following the standard protocol without IV contrast. COMPARISON:  Chest CT 08/18/2013 FINDINGS: CT CHEST FINDINGS Chest wall: No chest wall mass, supraclavicular or axillary lymphadenopathy. The the thyroid gland is grossly normal. A left-sided PICC line is noted. Cardiovascular: Endotracheal tube and NG tubes are in good position. No complicating features. The heart is normal in size. No pericardial effusion. There is tortuosity, ectasia and calcification of the thoracic aorta. No focal aneurysm. Scattered coronary artery calcifications. Mediastinum/Nodes: No mediastinal or hilar mass or adenopathy. The esophagus is grossly normal. Lungs/Pleura: Biapical pleural and parenchymal scarring changes, left greater than right, stable. Severe emphysematous changes and pulmonary scarring. Stable 6 mm nodular density in the right upper lobe adjacent to an area of pleural thickening. No new worrisome pulmonary lesions or acute pulmonary findings. No pulmonary infiltrate or abscess. Small amount of pleural fluid on the right. Musculoskeletal: No significant bony findings. No evidence of diskitis  or osteomyelitis. CT ABDOMEN PELVIS FINDINGS Hepatobiliary: Stable right hepatic lobe cyst. No worrisome hepatic lesions or intrahepatic biliary dilatation. Layering gallstones are noted in the gallbladder. No common bile duct dilatation. Pancreas: No mass, inflammation or ductal dilatation. Spleen: Normal size.  No focal lesions. Adrenals/Urinary Tract: The adrenal glands are unremarkable. Right renal calculus but no obstructing ureteral calculi or bladder calculi. No obvious renal or bladder mass. The bladder is decompressed by Foley catheter. Stomach/Bowel: The stomach, duodenum, small bowel and colon are grossly normal without oral contrast. No inflammatory changes, mass lesions or obstructive findings. The terminal ileum and appendix are normal. Moderate distention of the colon with air-fluid  levels suggesting diarrhea. No colonic wall thickening or low colonic obstruction. Vascular/Lymphatic: Atherosclerotic calcifications involving the aorta iliac arteries. No aneurysm. No mesenteric or retroperitoneal mass or adenopathy. Reproductive: The prostate gland and seminal vesicles are grossly normal. Other: No pelvic mass, adenopathy or free pelvic fluid collections. No findings for abdominal or pelvic abscess. Musculoskeletal: No significant bony findings. Moderate degenerative changes involving the lumbar spine. IMPRESSION: 1. Moderate diffuse colonic distention with air-fluid levels all way to the rectum suggesting diarrhea the but no wall thickening or obstructive findings. No significant small bowel findings. 2. No significant findings in the chest. No pneumonia or abscess. Stable emphysematous changes and pulmonary scarring. 3. The no intra-abdominal/intrapelvic abscess. 4. Body wall edema suggesting anasarca. 5. Cholelithiasis. 6. Stable hepatic cysts. Electronically Signed   By: Marijo Sanes M.D.   On: 02/17/2016 19:20   Ct Chest Wo Contrast  Result Date: 02/17/2016 CLINICAL DATA:  Fever of unknown origin. EXAM: CT CHEST, ABDOMEN AND PELVIS WITHOUT CONTRAST TECHNIQUE: Multidetector CT imaging of the chest, abdomen and pelvis was performed following the standard protocol without IV contrast. COMPARISON:  Chest CT 08/18/2013 FINDINGS: CT CHEST FINDINGS Chest wall: No chest wall mass, supraclavicular or axillary lymphadenopathy. The the thyroid gland is grossly normal. A left-sided PICC line is noted. Cardiovascular: Endotracheal tube and NG tubes are in good position. No complicating features. The heart is normal in size. No pericardial effusion. There is tortuosity, ectasia and calcification of the thoracic aorta. No focal aneurysm. Scattered coronary artery calcifications. Mediastinum/Nodes: No mediastinal or hilar mass or adenopathy. The esophagus is grossly normal. Lungs/Pleura: Biapical  pleural and parenchymal scarring changes, left greater than right, stable. Severe emphysematous changes and pulmonary scarring. Stable 6 mm nodular density in the right upper lobe adjacent to an area of pleural thickening. No new worrisome pulmonary lesions or acute pulmonary findings. No pulmonary infiltrate or abscess. Small amount of pleural fluid on the right. Musculoskeletal: No significant bony findings. No evidence of diskitis or osteomyelitis. CT ABDOMEN PELVIS FINDINGS Hepatobiliary: Stable right hepatic lobe cyst. No worrisome hepatic lesions or intrahepatic biliary dilatation. Layering gallstones are noted in the gallbladder. No common bile duct dilatation. Pancreas: No mass, inflammation or ductal dilatation. Spleen: Normal size.  No focal lesions. Adrenals/Urinary Tract: The adrenal glands are unremarkable. Right renal calculus but no obstructing ureteral calculi or bladder calculi. No obvious renal or bladder mass. The bladder is decompressed by Foley catheter. Stomach/Bowel: The stomach, duodenum, small bowel and colon are grossly normal without oral contrast. No inflammatory changes, mass lesions or obstructive findings. The terminal ileum and appendix are normal. Moderate distention of the colon with air-fluid levels suggesting diarrhea. No colonic wall thickening or low colonic obstruction. Vascular/Lymphatic: Atherosclerotic calcifications involving the aorta iliac arteries. No aneurysm. No mesenteric or retroperitoneal mass or adenopathy. Reproductive: The prostate gland and seminal  vesicles are grossly normal. Other: No pelvic mass, adenopathy or free pelvic fluid collections. No findings for abdominal or pelvic abscess. Musculoskeletal: No significant bony findings. Moderate degenerative changes involving the lumbar spine. IMPRESSION: 1. Moderate diffuse colonic distention with air-fluid levels all way to the rectum suggesting diarrhea the but no wall thickening or obstructive findings. No  significant small bowel findings. 2. No significant findings in the chest. No pneumonia or abscess. Stable emphysematous changes and pulmonary scarring. 3. The no intra-abdominal/intrapelvic abscess. 4. Body wall edema suggesting anasarca. 5. Cholelithiasis. 6. Stable hepatic cysts. Electronically Signed   By: Marijo Sanes M.D.   On: 02/17/2016 19:20    Medications:  Prior to Admission:  Prescriptions Prior to Admission  Medication Sig Dispense Refill Last Dose  . albuterol (PROVENTIL HFA;VENTOLIN HFA) 108 (90 Base) MCG/ACT inhaler Inhale 1-2 puffs into the lungs every 6 (six) hours as needed for wheezing or shortness of breath. 1 Inhaler 0 Past Week at Unknown time  . albuterol (PROVENTIL) (2.5 MG/3ML) 0.083% nebulizer solution Take 3 mLs (2.5 mg total) by nebulization every 6 (six) hours as needed for wheezing or shortness of breath. 150 mL 1 Past Week at Unknown time  . ALPRAZolam (XANAX) 1 MG tablet Take 1 tablet (1 mg total) by mouth 3 (three) times daily as needed for anxiety. 90 tablet 1 Past Week at Unknown time  . amLODipine (NORVASC) 10 MG tablet Take 10 mg by mouth daily.   Past Week at Unknown time  . budesonide-formoterol (SYMBICORT) 160-4.5 MCG/ACT inhaler Inhale 2 puffs into the lungs 2 (two) times daily.   Past Week at Unknown time  . FLUoxetine (PROZAC) 40 MG capsule Take 1 capsule (40 mg total) by mouth daily. 30 capsule 3 Past Week at Unknown time  . ibuprofen (ADVIL,MOTRIN) 200 MG tablet Take 200 mg by mouth every 6 (six) hours as needed for headache, mild pain or moderate pain. For pain    Past Week at Unknown time  . Ivermectin 0.5 % LOTN Apply thoroughly to dry hair and scalp; leave lotion on hair for 10 minutes, and then rinse with water. 1 Tube 1 Past Week at Unknown time  . loratadine (CLARITIN) 10 MG tablet Take 1 tablet (10 mg total) by mouth daily. 30 tablet 11 Past Month at Unknown time  . omeprazole (PRILOSEC) 20 MG capsule take 1 capsule by mouth once daily 30 capsule  3 Past Month at Unknown time  . propranolol (INDERAL) 10 MG tablet take 1 tablet by mouth twice a day for TREMOR 60 tablet 3 Past Week at Unknown time  . rosuvastatin (CRESTOR) 10 MG tablet take 1 tablet by mouth once daily 30 tablet 3 Past Week at Unknown time  . tiotropium (SPIRIVA) 18 MCG inhalation capsule Place 18 mcg into inhaler and inhale daily.   Past Week at Unknown time  . vitamin B-12 (CYANOCOBALAMIN) 1000 MCG tablet Take 1,000 mcg by mouth daily.   Past Week at Unknown time  . nitroGLYCERIN (NITROSTAT) 0.4 MG SL tablet Place 0.4 mg under the tongue every 5 (five) minutes as needed for chest pain.   unknown  . [DISCONTINUED] predniSONE (DELTASONE) 20 MG tablet 2 tabs po daily x 4 days 8 tablet 0    Scheduled: . chlorhexidine gluconate (MEDLINE KIT)  15 mL Mouth Rinse BID  . Chlorhexidine Gluconate Cloth  6 each Topical Daily  . enoxaparin (LOVENOX) injection  40 mg Subcutaneous Q24H  . famotidine (PEPCID) IV  20 mg Intravenous Q12H  .  fentaNYL (SUBLIMAZE) injection  50 mcg Intravenous Once  . furosemide  40 mg Intravenous Q12H  . ipratropium-albuterol  3 mL Nebulization Q6H  . mouth rinse  15 mL Mouth Rinse QID  . methylPREDNISolone (SOLU-MEDROL) injection  60 mg Intravenous Q6H  . nicotine  21 mg Transdermal Daily  . potassium chloride  40 mEq Oral Daily  . propofol  20 mg Intravenous Once  . propofol (DIPRIVAN) infusion  5-80 mcg/kg/min Intravenous Once  . sodium chloride flush  10-40 mL Intracatheter Q12H   Continuous: . feeding supplement (VITAL AF 1.2 CAL) 1,000 mL (02/16/16 0814)  . fentaNYL infusion INTRAVENOUS 200 mcg/hr (02/18/16 0800)  . propofol (DIPRIVAN) infusion 15 mcg/kg/min (02/18/16 0835)   TSV:[id number], albuterol, bisacodyl, fentaNYL, sodium chloride, sodium chloride flush  Assesment: He was admitted with COPD exacerbation acute on chronic respiratory failure and he's been on the ventilator since admission. Multiple weaning attempts have been done and  he has failed. At baseline he has severe COPD. He's had some volume overload is being treated with Lasix. He's had fever again. CT chest and abdomen done yesterday shows he's got some air-fluid levels in his: And nothing acute on his chest Principal Problem:   COPD exacerbation (HCC) Active Problems:   COPD (chronic obstructive pulmonary disease) (HCC)   Essential hypertension, benign   Respiratory failure (HCC)   Acute on chronic respiratory failure with hypercapnia (HCC)   Respiratory disorder with ventilator dependence (HCC)   Fever   Acute bronchitis   Palliative care encounter   Goals of care, counseling/discussion   DNR (do not resuscitate) discussion   Ventilator dependence ([place])   Acute respiratory failure with hypoxia and hypercarbia (HCC)   Elevated troponin   FUO (fever of unknown origin)   Acute on chronic respiratory failure with hypoxia and hypercapnia (HCC)   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 I'm concerned that he is not going to be able to come off the ventilator. Choices would be to try to extubate him and see how he does versus transfer for tracheostomy.    LOS: 11 days   , L 02/18/2016, 9:55 AM

## 2016-02-18 NOTE — Progress Notes (Signed)
PROGRESS NOTE  [name] DHR:[id number] DOB: 16-Mar-1940 DOA: 01/28/2016 PCP: Jeremy Blackbird, MD  Brief History:  76 year old [name] admitted to the hospital on 1/23 due to shortness of breath, was intubated in the emergency department. Continued difficulty with resp acidosis on vent. Abdominal distention but Xray without obstruction. Tube feeds have been held. 1/28: ABG improved. With significant increased volume and changes on CXR that may represent volume overload. Weaning attempts have been unsuccessful initially due to respiratory acidosis and now due to tachycardia every time sedation is decreased. Palliative care and rounding physicians have had multiple family discussions with some family members seem to be unrealistic with expectations. Patient has very poor prognosis. Pulmonary medicine continues to follow.  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 Luan [name] input and recommendations-->if unable to wean by 02/20/16-->will need trach/PEG pending family meeting on 02/20/16 -has failed multiple weaning attempts -appreciate Palliative medicine continued discussions  New Fever -UA--no pyuria -blood cultures x 2 sets--neg to date -tracheal aspirate/culture--pending -procalcitonin <0.10 -personally reviewed Chest x-ray--negative for infiltrates; shows chronic interstitial prominence -d/c antimicrobials and observe clinically -CT chest--no infiltrates. Stable right upper lobe 6 cm nodular density. Emphysema with pulmonary scarring -CT- abdomen/pelvis--moderately distended colon with air-fluid levels; no colonic wall thickening. No hydronephrosis or abscess. -Continue to observe off antibiotics -with mild transaminasemia and cholelithiasis-->RUQ ultrasound  COPD Exacerbation -continue  solumedrol -continue DuoNebs -appreciate Dr. Kathaleen [name] followup  HCAP -check procalcitonin < 0.10 -finished 8 days vanc/zosyn -d/c antimicrobials and observe  Elevated troponins -Due to demand ischemia -02/10/2016 echo EF 55-60%, grade 1 daily, technically difficult -No plans for aggressive cardiac workup  Paroxsymal Afib--new onset -secondary to infection and respiratory failure/COPD -presently in sinus  Lower extremity edema -check albumin--2.8 -urine protein/creatinine ratio--unable to calculate as urine protein <6 -increasedlasix per Dr. Luan [name] -daily weights--NEG 10 lbs since increased lasix; NEG 7 L in past 72 hours -Am BMP  FEN -agree with restart enteral feeds-->tolerating _0 /hr -hypernatremia due to furosemide -Hypokalemia-->replete -Magnesium 2.2 -diuresis per Dr. Luan [name]  Hyperglycemia -likely due to steroids -start SSI  Goals of Care -Appreciate palliative medicine follow-up--discussed with [name] 02/17/16 -Patient is now no defibrillation, no chest compressions -complex family dynamics      Disposition Plan: Remain in ICU Family Communication: NoFamily at bedside--Total time spent 35 minutes. Greater than 50% spent face to face counseling and coordinating care.   Consultants: Dr. Luan [name]  Code Status: FULL  DVT Prophylaxis: Strang Lovenox   Procedures:  As Listed in Progress Note Above  Antibiotics: Zosyn 02/09/2016>>>02/16/16 Vancomycin 02/09/2016>>>02/16/16     Subjective: Patient is sedated on a ventilator. No reported respiratory distress, diarrhea, vomiting, uncontrolled pain. The patient failed weaning attempt today.  Objective: Vitals:   02/18/16 0709 02/18/16 0800 02/18/16 0855 02/18/16 1105  BP:      Pulse:      Resp: 20   17  Temp: 99.5 F (37.5 C)   (!) 101.6 F (38.7 C)  TempSrc: Axillary     SpO2:  96% 96%   Weight:      Height:        Intake/Output Summary (Last 24 hours) at  02/18/16 1221 Last data filed at 02/18/16 0906  Gross per 24 hour  Intake           993.65 ml  Output  2750 ml  Net         -1756.35 ml   Weight change: 1.1 kg (2 lb 6.8 oz) Exam:   General:  Pt is intubated and sedated on ventilator, not in acute distress  HEENT: No icterus, No thrush, No neck mass, [place]/AT  Cardiovascular: RRR, S1/S2, no rubs, no gallops  Respiratory: Diminished breath sounds bilaterally. Bibasilar rales. Good air movement.  Abdomen: Soft/+BS, non tender, non distended, no guarding  Extremities: 1 + LE edema, No lymphangitis, No petechiae, No rashes, no synovitis   Data Reviewed: I have personally reviewed following labs and imaging studies Basic Metabolic Panel:  Recent Labs Lab 02/13/16 0553 02/15/16 0417 02/16/16 0421 02/17/16 0416 02/18/16 0455  NA 149* 151* 153* 152* 153*  K 4.3 3.6 3.0* 3.3* 3.4*  CL 118* 116* 114* 112* 110  CO2 20* _0 33*  GLUCOSE 124* 141* 175* 194* 228*  BUN 45* 40* 49* 60* 63*  CREATININE 1.31* 1.23 1.42* 1.56* 1.43*  CALCIUM 7.6* 7.8* 7.6* 7.5* 7.1*  MG  --   --  2.2  --   --    Liver Function Tests:  Recent Labs Lab 02/15/16 0417 02/18/16 0455  AST 30 32  ALT 51 83*  ALKPHOS 37* 43  BILITOT 1.0 0.8  PROT 5.1* 5.0*  ALBUMIN 2.8* 2.6*   No results for input(s): LIPASE, AMYLASE in the last 168 hours. No results for input(s): AMMONIA in the last 168 hours. Coagulation Profile: No results for input(s): INR, PROTIME in the last 168 hours. CBC:  Recent Labs Lab 02/12/16 0734 02/15/16 0417 02/18/16 0455  WBC 4.7 6.1 12.3*  NEUTROABS  --   --  11.3*  HGB 13.7 13.8 13.6  HCT 42.7 41.9 43.2  MCV 98.4 95.7 98.9  PLT 131* 159 109*   Cardiac Enzymes: No results for input(s): CKTOTAL, CKMB, CKMBINDEX, TROPONINI in the last 168 hours. BNP: Invalid input(s): POCBNP CBG:  Recent Labs Lab 02/17/16 0356 02/17/16 0734 02/17/16 1139 02/18/16 0708 02/18/16 1102  GLUCAP 190* 191* 212* 190*  204*   HbA1C: No results for input(s): HGBA1C in the last 72 hours. Urine analysis:    Component Value Date/Time   COLORURINE YELLOW 02/16/2016 0739   APPEARANCEUR CLEAR 02/16/2016 0739   LABSPEC 1.015 02/16/2016 0739   PHURINE 5.0 02/16/2016 0739   GLUCOSEU NEGATIVE 02/16/2016 0739   HGBUR SMALL (A) 02/16/2016 0739   BILIRUBINUR NEGATIVE 02/16/2016 0739   KETONESUR NEGATIVE 02/16/2016 0739   PROTEINUR NEGATIVE 02/16/2016 0739   UROBILINOGEN 0.2 04/19/2014 1946   NITRITE NEGATIVE 02/16/2016 0739   LEUKOCYTESUR NEGATIVE 02/16/2016 0739   Sepsis Labs: _1 (procalcitonin:4,lacticidven:4) ) Recent Results (from the past 240 hour(s))  Culture, blood (Routine X 2) w Reflex to ID Panel     Status: None   Collection Time: 02/09/16  8:24 AM  Result Value Ref Range Status   Specimen Description BLOOD LEFT HAND  Final   Special Requests BOTTLES DRAWN AEROBIC ONLY [place]  Final   Culture NO GROWTH 5 DAYS  Final   Report Status 02/14/2016 FINAL  Final  Culture, blood (Routine X 2) w Reflex to ID Panel     Status: None   Collection Time: 02/09/16  9:31 AM  Result Value Ref Range Status   Specimen Description BLOOD RIGHT HAND  Final   Special Requests BOTTLES DRAWN AEROBIC AND ANAEROBIC 6cc  Final   Culture NO GROWTH 5 DAYS  Final   Report Status 02/14/2016 FINAL  Final  Urine culture     Status: None   Collection Time: 02/16/16  7:39 AM  Result Value Ref Range Status   Specimen Description URINE, CLEAN CATCH  Final   Special Requests NONE  Final   Culture   Final    NO GROWTH Performed at [facility], [address], [place], [place] [address]    Report Status 02/17/2016 FINAL  Final  Culture, blood (Routine X 2) w Reflex to ID Panel     Status: None (Preliminary result)   Collection Time: 02/16/16  7:56 AM  Result Value Ref Range Status   Specimen Description BLOOD RIGHT HAND  Final   Special Requests BOTTLES DRAWN AEROBIC AND ANAEROBIC 6CC  Final   Culture NO GROWTH  1 DAY  Final   Report Status PENDING  Incomplete  Culture, blood (Routine X 2) w Reflex to ID Panel     Status: None (Preliminary result)   Collection Time: 02/16/16 10:01 AM  Result Value Ref Range Status   Specimen Description BLOOD RIGHT HAND  Final   Special Requests BOTTLES DRAWN AEROBIC ONLY 6CC  Final   Culture NO GROWTH < 24 HOURS  Final   Report Status PENDING  Incomplete  Culture, respiratory (NON-Expectorated)     Status: None (Preliminary result)   Collection Time: 02/17/16 11:Jeremy AM  Result Value Ref Range Status   Specimen Description TRACHEAL ASPIRATE  Final   Special Requests NONE  Final   Gram Stain   Final    RARE SQUAMOUS EPITHELIAL CELLS PRESENT MODERATE WBC PRESENT,BOTH PMN AND MONONUCLEAR FEW YEAST    Culture   Final    TOO YOUNG TO READ Performed at [facility], [place] [address] [place], [place] [address]    Report Status PENDING  Incomplete     Scheduled Meds: . chlorhexidine gluconate (MEDLINE KIT)  15 mL Mouth Rinse BID  . Chlorhexidine Gluconate Cloth  6 each Topical Daily  . enoxaparin (LOVENOX) injection  40 mg Subcutaneous Q24H  . famotidine (PEPCID) IV  20 mg Intravenous Q12H  . fentaNYL (SUBLIMAZE) injection  50 mcg Intravenous Once  . furosemide  40 mg Intravenous Q12H  . ipratropium-albuterol  3 mL Nebulization Q6H  . mouth rinse  15 mL Mouth Rinse QID  . methylPREDNISolone (SOLU-MEDROL) injection  60 mg Intravenous Q6H  . nicotine  Jeremy mg Transdermal Daily  . potassium chloride  40 mEq Oral Daily  . propofol  20 mg Intravenous Once  . propofol (DIPRIVAN) infusion  5-80 mcg/kg/min Intravenous Once  . sodium chloride flush  10-40 mL Intracatheter Q12H   Continuous Infusions: . feeding supplement (VITAL AF 1.2 CAL) 1,000 mL (02/16/16 0814)  . fentaNYL infusion INTRAVENOUS 200 mcg/hr (02/18/16 0800)  . propofol (DIPRIVAN) infusion 15 mcg/kg/min (02/18/16 0835)    Procedures/Studies: Ct Abdomen Pelvis Wo Contrast  Result Date:  02/17/2016 CLINICAL DATA:  Fever of unknown origin. EXAM: CT CHEST, ABDOMEN AND PELVIS WITHOUT CONTRAST TECHNIQUE: Multidetector CT imaging of the chest, abdomen and pelvis was performed following the standard protocol without IV contrast. COMPARISON:  Chest CT 08/18/2013 FINDINGS: CT CHEST FINDINGS Chest wall: No chest wall mass, supraclavicular or axillary lymphadenopathy. The the thyroid gland is grossly normal. A left-sided PICC line is noted. Cardiovascular: Endotracheal tube and NG tubes are in good position. No complicating features. The heart is normal in size. No pericardial effusion. There is tortuosity, ectasia and calcification of the thoracic aorta. No focal aneurysm. Scattered coronary artery calcifications. Mediastinum/Nodes:  No mediastinal or hilar mass or adenopathy. The esophagus is grossly normal. Lungs/Pleura: Biapical pleural and parenchymal scarring changes, left greater than right, stable. Severe emphysematous changes and pulmonary scarring. Stable 6 mm nodular density in the right upper lobe adjacent to an area of pleural thickening. No new worrisome pulmonary lesions or acute pulmonary findings. No pulmonary infiltrate or abscess. Small amount of pleural fluid on the right. Musculoskeletal: No significant bony findings. No evidence of diskitis or osteomyelitis. CT ABDOMEN PELVIS FINDINGS Hepatobiliary: Stable right hepatic lobe cyst. No worrisome hepatic lesions or intrahepatic biliary dilatation. Layering gallstones are noted in the gallbladder. No common bile duct dilatation. Pancreas: No mass, inflammation or ductal dilatation. Spleen: Normal size.  No focal lesions. Adrenals/Urinary Tract: The adrenal glands are unremarkable. Right renal calculus but no obstructing ureteral calculi or bladder calculi. No obvious renal or bladder mass. The bladder is decompressed by Foley catheter. Stomach/Bowel: The stomach, duodenum, small bowel and colon are grossly normal without oral contrast. No  inflammatory changes, mass lesions or obstructive findings. The terminal ileum and appendix are normal. Moderate distention of the colon with air-fluid levels suggesting diarrhea. No colonic wall thickening or low colonic obstruction. Vascular/Lymphatic: Atherosclerotic calcifications involving the aorta iliac arteries. No aneurysm. No mesenteric or retroperitoneal mass or adenopathy. Reproductive: The prostate gland and seminal vesicles are grossly normal. Other: No pelvic mass, adenopathy or free pelvic fluid collections. No findings for abdominal or pelvic abscess. Musculoskeletal: No significant bony findings. Moderate degenerative changes involving the lumbar spine. IMPRESSION: 1. Moderate diffuse colonic distention with air-fluid levels all way to the rectum suggesting diarrhea the but no wall thickening or obstructive findings. No significant small bowel findings. 2. No significant findings in the chest. No pneumonia or abscess. Stable emphysematous changes and pulmonary scarring. 3. The no intra-abdominal/intrapelvic abscess. 4. Body wall edema suggesting anasarca. 5. Cholelithiasis. 6. Stable hepatic cysts. Electronically Signed   By: Marijo Sanes M.D.   On: 02/17/2016 19:20   Dg Abd 1 View  Result Date: 02/10/2016 CLINICAL DATA:  76 year old male are with abdominal distention and concern for small bowel obstruction. Decreased bowel sounds. EXAM: ABDOMEN - 1 VIEW COMPARISON:  None. FINDINGS: An enteric tube is partially visualized with tip in the proximal stomach. The side port of the enteric tube appears to be at the level of the gastroesophageal junction. There is no bowel dilatation or evidence of obstruction. Air is noted within the stomach and colon. There is thickened appearance of the gastric wall which may be artifactual. Clinical correlation is recommended to evaluate for gastritis. No free air or radiopaque calculi identified. There is degenerative changes of the spine. No acute osseous  pathology identified. IMPRESSION: No bowel dilatation or evidence of obstruction. Enteric tube with tip in the proximal stomach. Artifact versus less likely thickening of the gastric wall. Clinical correlation is recommended to evaluate for gastritis. Electronically Signed   By: Anner Crete M.D.   On: 02/10/2016 23:55   Ct Head Wo Contrast  Result Date: 01/30/2016 CLINICAL DATA:  Unresponsive.  Possible unintentional overdose. EXAM: CT HEAD WITHOUT CONTRAST TECHNIQUE: Contiguous axial images were obtained from the base of the skull through the vertex without intravenous contrast. COMPARISON:  Head CT 04/19/2014 and MRI brain 04/19/2014 FINDINGS: Brain: Stable age related cerebral atrophy, ventriculomegaly and periventricular white matter disease. No extra-axial fluid collections are identified. No CT findings for acute hemispheric infarction or intracranial hemorrhage. No mass lesions. The brainstem and cerebellum are normal. Vascular: Stable vascular calcifications. No hyperdense  vessels or obvious aneurysm. Skull: No skull fracture or bone lesions. Sinuses/Orbits: Extensive chronic paranasal sinus disease mainly involving the ethmoid air cells, left maxillary sinus and left half of the sphenoid sinus. The mastoid air cells and middle ear cavities are clear. The globes are intact. Other: No scalp lesions or scalp hematoma. IMPRESSION: 1. Age related cerebral atrophy, ventriculomegaly and periventricular white matter disease, unchanged when compared to prior examination. 2. No acute intracranial findings or mass lesions. 3. Chronic sinus disease. Electronically Signed   By: Marijo Sanes M.D.   On: 01/25/2016 17:00   Ct Chest Wo Contrast  Result Date: 02/17/2016 CLINICAL DATA:  Fever of unknown origin. EXAM: CT CHEST, ABDOMEN AND PELVIS WITHOUT CONTRAST TECHNIQUE: Multidetector CT imaging of the chest, abdomen and pelvis was performed following the standard protocol without IV contrast. COMPARISON:   Chest CT 08/18/2013 FINDINGS: CT CHEST FINDINGS Chest wall: No chest wall mass, supraclavicular or axillary lymphadenopathy. The the thyroid gland is grossly normal. A left-sided PICC line is noted. Cardiovascular: Endotracheal tube and NG tubes are in good position. No complicating features. The heart is normal in size. No pericardial effusion. There is tortuosity, ectasia and calcification of the thoracic aorta. No focal aneurysm. Scattered coronary artery calcifications. Mediastinum/Nodes: No mediastinal or hilar mass or adenopathy. The esophagus is grossly normal. Lungs/Pleura: Biapical pleural and parenchymal scarring changes, left greater than right, stable. Severe emphysematous changes and pulmonary scarring. Stable 6 mm nodular density in the right upper lobe adjacent to an area of pleural thickening. No new worrisome pulmonary lesions or acute pulmonary findings. No pulmonary infiltrate or abscess. Small amount of pleural fluid on the right. Musculoskeletal: No significant bony findings. No evidence of diskitis or osteomyelitis. CT ABDOMEN PELVIS FINDINGS Hepatobiliary: Stable right hepatic lobe cyst. No worrisome hepatic lesions or intrahepatic biliary dilatation. Layering gallstones are noted in the gallbladder. No common bile duct dilatation. Pancreas: No mass, inflammation or ductal dilatation. Spleen: Normal size.  No focal lesions. Adrenals/Urinary Tract: The adrenal glands are unremarkable. Right renal calculus but no obstructing ureteral calculi or bladder calculi. No obvious renal or bladder mass. The bladder is decompressed by Foley catheter. Stomach/Bowel: The stomach, duodenum, small bowel and colon are grossly normal without oral contrast. No inflammatory changes, mass lesions or obstructive findings. The terminal ileum and appendix are normal. Moderate distention of the colon with air-fluid levels suggesting diarrhea. No colonic wall thickening or low colonic obstruction. Vascular/Lymphatic:  Atherosclerotic calcifications involving the aorta iliac arteries. No aneurysm. No mesenteric or retroperitoneal mass or adenopathy. Reproductive: The prostate gland and seminal vesicles are grossly normal. Other: No pelvic mass, adenopathy or free pelvic fluid collections. No findings for abdominal or pelvic abscess. Musculoskeletal: No significant bony findings. Moderate degenerative changes involving the lumbar spine. IMPRESSION: 1. Moderate diffuse colonic distention with air-fluid levels all way to the rectum suggesting diarrhea the but no wall thickening or obstructive findings. No significant small bowel findings. 2. No significant findings in the chest. No pneumonia or abscess. Stable emphysematous changes and pulmonary scarring. 3. The no intra-abdominal/intrapelvic abscess. 4. Body wall edema suggesting anasarca. 5. Cholelithiasis. 6. Stable hepatic cysts. Electronically Signed   By: Marijo Sanes M.D.   On: 02/17/2016 19:20   Dg Chest Port 1 View  Result Date: 02/16/2016 CLINICAL DATA:  Respiratory failure. EXAM: PORTABLE CHEST 1 VIEW COMPARISON:  02/13/2016 . FINDINGS: Endotracheal tube and NG tube in stable position. Mild right base infiltrate cannot be excluded. No pleural effusion or pneumothorax. No acute bony  abnormality . IMPRESSION: 1.  Lines and tubes in stable position. 2.  Mild right base infiltrate cannot be excluded . Electronically Signed   By: Marcello Moores  Register   On: 02/16/2016 07:32   Dg Chest Port 1 View  Result Date: 02/13/2016 CLINICAL DATA:  Follow-up chest x-ray, ventilated patient. History of COPD, acute on chronic respiratory failure current smoker. EXAM: PORTABLE CHEST 1 VIEW COMPARISON:  Portable chest x-ray of February 12, 2016 FINDINGS: The patient is rotated toward the right. The lungs are hyperinflated. There is no focal infiltrate. There is a trace of blunting of the costophrenic angles. The lung markings are coarse in the right upper lobe. There is no pneumothorax nor  pneumomediastinum. The heart and pulmonary vascularity are normal. The endotracheal tube tip lies 6 cm above the carina. The esophagogastric tube tip projects below the inferior margin of the image. IMPRESSION: COPD, coarse right upper lobe interstitial markings may reflect subsegmental atelectasis or fibrosis. Trace bilateral pleural effusions. No alveolar pneumonia nor pulmonary edema. The support tubes are in reasonable position. Electronically Signed   By: Timoty Bourke  [place] M.D.   On: 02/13/2016 07:33   Dg Chest Port 1 View  Result Date: 02/12/2016 CLINICAL DATA:  Shortness of Breath EXAM: PORTABLE CHEST 1 VIEW COMPARISON:  02/11/2016 FINDINGS: There is hyperinflation of the lungs compatible with COPD. Endotracheal tube and NG tube are unchanged. No confluent opacities or effusions. Heart is normal size. IMPRESSION: COPD.  No acute findings. Electronically Signed   By: Rolm Baptise M.D.   On: 02/12/2016 07:52   Dg Chest Port 1 View  Result Date: 02/11/2016 CLINICAL DATA:  Short of breath. Ventilated patient. Subsequent encounter. EXAM: PORTABLE CHEST 1 VIEW COMPARISON:  02/10/2016 FINDINGS: Endotracheal tube and nasal/orogastric tube are stable. Lungs are hyperexpanded. There changes of emphysema with decrease markings in the peripheral lungs most evident on the left. No evidence of pneumonia or pulmonary edema. No pleural effusion or pneumothorax. IMPRESSION: 1. No acute findings in the lungs. 2. COPD. 3. Support apparatus is stable and well positioned. Electronically Signed   By: Lajean Manes M.D.   On: 02/11/2016 08:04   Dg Chest Port 1 View  Result Date: 02/10/2016 CLINICAL DATA:  Ventilator dependent EXAM: PORTABLE CHEST 1 VIEW COMPARISON:  02/09/2016 FINDINGS: Endotracheal tube and nasogastric catheter are again seen and stable. Cardiac shadow is within normal limits. Previously seen patchy changes in the right upper lobe have resolved in the interval. No new focal infiltrate is seen. No bony  abnormality is noted. IMPRESSION: No acute abnormality seen. Tubes and lines as described. Electronically Signed   By: Inez Catalina M.D.   On: 02/10/2016 09:01   Dg Chest Port 1 View  Result Date: 02/09/2016 CLINICAL DATA:  Irregular heartbeat.  Chest pain . EXAM: PORTABLE CHEST 1 VIEW COMPARISON:  02/10/2016. FINDINGS: Endotracheal tube noted with tip 3 cm above the carina. NG tube noted with tip below left hemidiaphragm. Mild infiltrate in the right upper lobe. Questionable nodular density is noted in the right apex. Small bilateral pleural effusions. Apical pleural-parenchymal thickening and cystic changes again noted on the right. These changes are consistent with scarring. No pneumothorax. Heart size stable. IMPRESSION: 1.  Endotracheal tube and NG tube in good anatomic position. 2. Questionable nodule right upper lobe. Questionable right upper lobe infiltrate. Follow-up chest x-rays recommended to demonstrate clearing. Electronically Signed   By: Marcello Moores  Register   On: 02/09/2016 08:11   Dg Chest Port 1 View  Result Date: 01/19/2016  CLINICAL DATA:  Respiratory failure EXAM: PORTABLE CHEST 1 VIEW COMPARISON:  10/27/2015 FINDINGS: Endotracheal tube placed. Tip is 4.5 cm from the carina. NG tube tip is beyond the gastroesophageal junction. Lungs are hyperaerated. New nodular density at the right apex measures less than 1 cm. No pneumothorax. Small right pleural effusion. IMPRESSION: Endotracheal and NG tubes placed. Subcentimeter nodule at the right apex has developed. Small right pleural effusion. Electronically Signed   By: Marybelle Killings M.D.   On: 02/10/2016 16:09    Meleane Selinger, DO  [facility] Pager [phone]  If 7PM-7AM, please contact night-coverage [email or web address] Password [email or web address] 02/18/2016, 12:Jeremy PM   LOS: 11 days

## 2016-02-19 ENCOUNTER — Inpatient Hospital Stay (HOSPITAL_COMMUNITY): Payer: Medicare HMO

## 2016-02-19 LAB — GLUCOSE, CAPILLARY
GLUCOSE-CAPILLARY: 173 mg/dL — AB (ref 65–99)
GLUCOSE-CAPILLARY: 193 mg/dL — AB (ref 65–99)
GLUCOSE-CAPILLARY: 172 mg/dL — AB (ref 65–99)
Glucose-Capillary: 170 mg/dL — ABNORMAL HIGH (ref 65–99)
Glucose-Capillary: 175 mg/dL — ABNORMAL HIGH (ref 65–99)
Glucose-Capillary: 235 mg/dL — ABNORMAL HIGH (ref 65–99)

## 2016-02-19 LAB — COMPREHENSIVE METABOLIC PANEL
ALT: 70 U/L — AB (ref 17–63)
AST: 29 U/L (ref 15–41)
Albumin: 2.8 g/dL — ABNORMAL LOW (ref 3.5–5.0)
Alkaline Phosphatase: 56 U/L (ref 38–126)
Anion gap: 12 (ref 5–15)
BUN: 67 mg/dL — ABNORMAL HIGH (ref 6–20)
CHLORIDE: 108 mmol/L (ref 101–111)
CO2: 32 mmol/L (ref 22–32)
CREATININE: 1.31 mg/dL — AB (ref 0.61–1.24)
Calcium: 7.5 mg/dL — ABNORMAL LOW (ref 8.9–10.3)
GFR, EST AFRICAN AMERICAN: 60 mL/min — AB (ref >60)
GFR, EST NON AFRICAN AMERICAN: 52 mL/min — AB (ref >60)
Glucose, Bld: 204 mg/dL — ABNORMAL HIGH (ref 65–99)
POTASSIUM: 4 mmol/L (ref 3.5–5.1)
Sodium: 152 mmol/L — ABNORMAL HIGH (ref 135–145)
Total Bilirubin: 0.8 mg/dL (ref 0.3–1.2)
Total Protein: 5.5 g/dL — ABNORMAL LOW (ref 6.5–8.1)

## 2016-02-19 LAB — BLOOD GAS, ARTERIAL
Acid-Base Excess: 7 mmol/L — ABNORMAL HIGH (ref 0.0–2.0)
BICARBONATE: 29.6 mmol/L — AB (ref 20.0–28.0)
Drawn by: 105551
FIO2: 30
LHR: 20 {breaths}/min
O2 Saturation: 95.1 %
PEEP/CPAP: 5 cmH2O
PO2 ART: 80.5 mmHg — AB (ref 83.0–108.0)
VT: 550 mL
pCO2 arterial: 52.3 mmHg — ABNORMAL HIGH (ref 32.0–48.0)
pH, Arterial: 7.401 (ref 7.350–7.450)

## 2016-02-19 LAB — CBC
HCT: 49.2 % (ref 39.0–52.0)
Hemoglobin: 15.6 g/dL (ref 13.0–17.0)
MCH: 31.5 pg (ref 26.0–34.0)
MCHC: 31.7 g/dL (ref 30.0–36.0)
MCV: 99.4 fL (ref 78.0–100.0)
PLATELETS: 144 10*3/uL — AB (ref 150–400)
RBC: 4.95 MIL/uL (ref 4.22–5.81)
RDW: 15.7 % — ABNORMAL HIGH (ref 11.5–15.5)
WBC: 23.6 10*3/uL — AB (ref 4.0–10.5)

## 2016-02-19 LAB — CULTURE, RESPIRATORY

## 2016-02-19 LAB — CULTURE, RESPIRATORY W GRAM STAIN

## 2016-02-19 LAB — C DIFFICILE QUICK SCREEN W PCR REFLEX
C DIFFICILE (CDIFF) INTERP: NOT DETECTED
C Diff antigen: NEGATIVE
C Diff toxin: NEGATIVE

## 2016-02-19 MED ORDER — METHYLPREDNISOLONE SODIUM SUCC 125 MG IJ SOLR
60.0000 mg | Freq: Three times a day (TID) | INTRAMUSCULAR | Status: DC
  Administered 2016-02-19 – 2016-02-20 (×4): 60 mg via INTRAVENOUS
  Filled 2016-02-19 (×4): qty 2

## 2016-02-19 NOTE — Plan of Care (Signed)
Problem: Nutrition: Goal: Adequate nutrition will be maintained Outcome: Progressing Pt now on vital AF @ 50cc/hr via og tube  Problem: Bowel/Gastric: Goal: Will not experience complications related to bowel motility Outcome: Not Progressing Pt is having some diarrhea since starting og tube feeding

## 2016-02-19 NOTE — Progress Notes (Signed)
Subjective: The events of last night have been noted. He has become hypotensive and his sedation was reduced but then he became tachypnea And tachycardic. This is better now but still has tachycardia. His blood pressure is better. No other new problems noted.  Objective: Vital signs in last 24 hours: Temp:  [100.8 F (38.2 C)-102.1 F (38.9 C)] 100.8 F (38.2 C) (02/04 0600) Pulse Rate:  [109] 109 (02/03 1934) Resp:  [16-28] 28 (02/04 0600) BP: (50-186)/(38-121) 173/104 (02/04 0600) SpO2:  [96 %-97 %] 96 % (02/04 0257) FiO2 (%):  [30 %] 30 % (02/04 0257) Weight:  [63.2 kg (139 lb 5.3 oz)] 63.2 kg (139 lb 5.3 oz) (02/04 0500) Weight change: -0.5 kg (-1 lb 1.6 oz) Last BM Date: 02/18/16  Intake/Output from previous day: 02/03 0701 - 02/04 0700 In: 1862.9 [I.V.:1062.9; NG/GT:100; IV Piggyback:200] Out: 2750 [Urine:2750]  PHYSICAL EXAM Constitutional: He is intubated and sedated on mechanical ventilation. Cardiovascular: His heart is regular with a rate of about 120 and with some PACs and PVCs. He still has 1+ edema Respiratory: His lungs show rhonchi bilaterally. He is intubated and on the ventilator. Gastrointestinal: His abdomen is soft bowel sounds sluggish. Skin: He has some bruising. Ears nose mouth and throat: He has endotracheal tube and OG tube mucous membranes are mildly dry  Lab Results:  Results for orders placed or performed during the hospital encounter of 01/30/2016 (from the past 48 hour(s))  Culture, respiratory (NON-Expectorated)     Status: None (Preliminary result)   Collection Time: 02/17/16 11:21 AM  Result Value Ref Range   Specimen Description TRACHEAL ASPIRATE    Special Requests NONE    Gram Stain      RARE SQUAMOUS EPITHELIAL CELLS PRESENT MODERATE WBC PRESENT,BOTH PMN AND MONONUCLEAR FEW YEAST    Culture      TOO YOUNG TO READ Performed at [facility], [place] [address] [place], [place] [address]    Report Status PENDING   Glucose, capillary      Status: Abnormal   Collection Time: 02/17/16 11:39 AM  Result Value Ref Range   Glucose-Capillary 212 (H) 65 - 99 mg/dL  Comprehensive metabolic panel     Status: Abnormal   Collection Time: 02/18/16  4:55 AM  Result Value Ref Range   Sodium 153 (H) 135 - 145 mmol/L   Potassium 3.4 (L) 3.5 - 5.1 mmol/L   Chloride 110 101 - 111 mmol/L   CO2 33 (H) 22 - 32 mmol/L   Glucose, Bld 228 (H) 65 - 99 mg/dL   BUN 63 (H) 6 - 20 mg/dL   Creatinine, Ser 1.43 (H) 0.61 - 1.24 mg/dL   Calcium 7.1 (L) 8.9 - 10.3 mg/dL   Total Protein 5.0 (L) 6.5 - 8.1 g/dL   Albumin 2.6 (L) 3.5 - 5.0 g/dL   AST 32 15 - 41 U/L   ALT 83 (H) 17 - 63 U/L   Alkaline Phosphatase 43 38 - 126 U/L   Total Bilirubin 0.8 0.3 - 1.2 mg/dL   GFR calc non Af Amer 46 (L) >60 mL/min   GFR calc Af Amer 54 (L) >60 mL/min    Comment: (NOTE) The eGFR has been calculated using the CKD EPI equation. This calculation has not been validated in all clinical situations. eGFR's persistently <60 mL/min signify possible Chronic Kidney Disease.    Anion gap 10 5 - 15  CBC with Differential/Platelet     Status: Abnormal   Collection Time: 02/18/16  4:55 AM  Result Value Ref Range   WBC 12.3 (H) 4.0 - 10.5 K/uL   RBC 4.37 4.22 - 5.81 MIL/uL   Hemoglobin 13.6 13.0 - 17.0 g/dL   HCT 43.2 39.0 - 52.0 %   MCV 98.9 78.0 - 100.0 fL   MCH 31.1 26.0 - 34.0 pg   MCHC 31.5 30.0 - 36.0 g/dL   RDW 15.5 11.5 - 15.5 %   Platelets 109 (L) 150 - 400 K/uL    Comment: SPECIMEN CHECKED FOR CLOTS PLATELET COUNT CONFIRMED BY SMEAR    Neutrophils Relative % 92 %   Neutro Abs 11.3 (H) 1.7 - 7.7 K/uL   Lymphocytes Relative 5 %   Lymphs Abs 0.6 (L) 0.7 - 4.0 K/uL   Monocytes Relative 3 %   Monocytes Absolute 0.4 0.1 - 1.0 K/uL   Eosinophils Relative 0 %   Eosinophils Absolute 0.0 0.0 - 0.7 K/uL   Basophils Relative 0 %   Basophils Absolute 0.0 0.0 - 0.1 K/uL  Glucose, capillary     Status: Abnormal   Collection Time: 02/18/16  7:08 AM  Result Value  Ref Range   Glucose-Capillary 190 (H) 65 - 99 mg/dL  Glucose, capillary     Status: Abnormal   Collection Time: 02/18/16 11:02 AM  Result Value Ref Range   Glucose-Capillary 204 (H) 65 - 99 mg/dL  Glucose, capillary     Status: Abnormal   Collection Time: 02/18/16  4:03 PM  Result Value Ref Range   Glucose-Capillary 202 (H) 65 - 99 mg/dL  Glucose, capillary     Status: Abnormal   Collection Time: 02/18/16  7:30 PM  Result Value Ref Range   Glucose-Capillary 176 (H) 65 - 99 mg/dL   Comment 1 Notify RN   Glucose, capillary     Status: Abnormal   Collection Time: 02/18/16 11:54 PM  Result Value Ref Range   Glucose-Capillary 212 (H) 65 - 99 mg/dL   Comment 1 Notify RN   Glucose, capillary     Status: Abnormal   Collection Time: 02/19/16  3:49 AM  Result Value Ref Range   Glucose-Capillary 175 (H) 65 - 99 mg/dL   Comment 1 Notify RN   Comprehensive metabolic panel     Status: Abnormal   Collection Time: 02/19/16  5:00 AM  Result Value Ref Range   Sodium 152 (H) 135 - 145 mmol/L   Potassium 4.0 3.5 - 5.1 mmol/L   Chloride 108 101 - 111 mmol/L   CO2 32 22 - 32 mmol/L   Glucose, Bld 204 (H) 65 - 99 mg/dL   BUN 67 (H) 6 - 20 mg/dL   Creatinine, Ser 1.31 (H) 0.61 - 1.24 mg/dL   Calcium 7.5 (L) 8.9 - 10.3 mg/dL   Total Protein 5.5 (L) 6.5 - 8.1 g/dL   Albumin 2.8 (L) 3.5 - 5.0 g/dL   AST 29 15 - 41 U/L   ALT 70 (H) 17 - 63 U/L   Alkaline Phosphatase 56 38 - 126 U/L   Total Bilirubin 0.8 0.3 - 1.2 mg/dL   GFR calc non Af Amer 52 (L) >60 mL/min   GFR calc Af Amer 60 (L) >60 mL/min    Comment: (NOTE) The eGFR has been calculated using the CKD EPI equation. This calculation has not been validated in all clinical situations. eGFR's persistently <60 mL/min signify possible Chronic Kidney Disease.    Anion gap 12 5 - 15  CBC     Status: Abnormal  Collection Time: 02/19/16  5:00 AM  Result Value Ref Range   WBC 23.6 (H) 4.0 - 10.5 K/uL   RBC 4.95 4.22 - 5.81 MIL/uL   Hemoglobin  15.6 13.0 - 17.0 g/dL   HCT 49.2 39.0 - 52.0 %   MCV 99.4 78.0 - 100.0 fL   MCH 31.5 26.0 - 34.0 pg   MCHC 31.7 30.0 - 36.0 g/dL   RDW 15.7 (H) 11.5 - 15.5 %   Platelets 144 (L) 150 - 400 K/uL    Comment: SPECIMEN CHECKED FOR CLOTS PLATELET COUNT CONFIRMED BY SMEAR   Blood gas, arterial     Status: Abnormal   Collection Time: 02/19/16  5:45 AM  Result Value Ref Range   FIO2 30.00    Delivery systems VENTILATOR    Mode PRESSURE REGULATED VOLUME CONTROL    VT 550 mL   LHR 20 resp/min   Peep/cpap 5.0 cm H20   pH, Arterial 7.401 7.350 - 7.450   pCO2 arterial 52.3 (H) 32.0 - 48.0 mmHg   pO2, Arterial 80.5 (L) 83.0 - 108.0 mmHg   Bicarbonate 29.6 (H) 20.0 - 28.0 mmol/L   Acid-Base Excess 7.0 (H) 0.0 - 2.0 mmol/L   O2 Saturation 95.1 %   Collection site RADIAL    Drawn by [id number]    Sample type ARTERIAL    Allens test (pass/fail) PASS PASS  Glucose, capillary     Status: Abnormal   Collection Time: 02/19/16  7:32 AM  Result Value Ref Range   Glucose-Capillary 173 (H) 65 - 99 mg/dL    ABGS  Recent Labs  02/19/16 0545  PHART 7.401  PO2ART 80.5*  HCO3 29.6*   CULTURES Recent Results (from the past 240 hour(s))  Culture, blood (Routine X 2) w Reflex to ID Panel     Status: None   Collection Time: 02/09/16  9:31 AM  Result Value Ref Range Status   Specimen Description BLOOD RIGHT HAND  Final   Special Requests BOTTLES DRAWN AEROBIC AND ANAEROBIC 6cc  Final   Culture NO GROWTH 5 DAYS  Final   Report Status 02/14/2016 FINAL  Final  Urine culture     Status: None   Collection Time: 02/16/16  7:39 AM  Result Value Ref Range Status   Specimen Description URINE, CLEAN CATCH  Final   Special Requests NONE  Final   Culture   Final    NO GROWTH Performed at [facility], [address], [place], [place] [address]    Report Status 02/17/2016 FINAL  Final  Culture, blood (Routine X 2) w Reflex to ID Panel     Status: None (Preliminary result)   Collection Time: 02/16/16   7:56 AM  Result Value Ref Range Status   Specimen Description BLOOD RIGHT HAND  Final   Special Requests BOTTLES DRAWN AEROBIC AND ANAEROBIC 6CC  Final   Culture NO GROWTH 2 DAYS  Final   Report Status PENDING  Incomplete  Culture, blood (Routine X 2) w Reflex to ID Panel     Status: None (Preliminary result)   Collection Time: 02/16/16 10:01 AM  Result Value Ref Range Status   Specimen Description BLOOD RIGHT HAND  Final   Special Requests BOTTLES DRAWN AEROBIC ONLY 6CC  Final   Culture NO GROWTH 2 DAYS  Final   Report Status PENDING  Incomplete  Culture, respiratory (NON-Expectorated)     Status: None (Preliminary result)   Collection Time: 02/17/16 11:21 AM  Result Value Ref  Range Status   Specimen Description TRACHEAL ASPIRATE  Final   Special Requests NONE  Final   Gram Stain   Final    RARE SQUAMOUS EPITHELIAL CELLS PRESENT MODERATE WBC PRESENT,BOTH PMN AND MONONUCLEAR FEW YEAST    Culture   Final    TOO YOUNG TO READ Performed at [facility], [place] [address], [place], [place] [address]    Report Status PENDING  Incomplete   Studies/Results: Ct Abdomen Pelvis Wo Contrast  Result Date: 02/17/2016 CLINICAL DATA:  Fever of unknown origin. EXAM: CT CHEST, ABDOMEN AND PELVIS WITHOUT CONTRAST TECHNIQUE: Multidetector CT imaging of the chest, abdomen and pelvis was performed following the standard protocol without IV contrast. COMPARISON:  Chest CT 08/18/2013 FINDINGS: CT CHEST FINDINGS Chest wall: No chest wall mass, supraclavicular or axillary lymphadenopathy. The the thyroid gland is grossly normal. A left-sided PICC line is noted. Cardiovascular: Endotracheal tube and NG tubes are in good position. No complicating features. The heart is normal in size. No pericardial effusion. There is tortuosity, ectasia and calcification of the thoracic aorta. No focal aneurysm. Scattered coronary artery calcifications. Mediastinum/Nodes: No mediastinal or hilar mass or adenopathy. The  esophagus is grossly normal. Lungs/Pleura: Biapical pleural and parenchymal scarring changes, left greater than right, stable. Severe emphysematous changes and pulmonary scarring. Stable 6 mm nodular density in the right upper lobe adjacent to an area of pleural thickening. No new worrisome pulmonary lesions or acute pulmonary findings. No pulmonary infiltrate or abscess. Small amount of pleural fluid on the right. Musculoskeletal: No significant bony findings. No evidence of diskitis or osteomyelitis. CT ABDOMEN PELVIS FINDINGS Hepatobiliary: Stable right hepatic lobe cyst. No worrisome hepatic lesions or intrahepatic biliary dilatation. Layering gallstones are noted in the gallbladder. No common bile duct dilatation. Pancreas: No mass, inflammation or ductal dilatation. Spleen: Normal size.  No focal lesions. Adrenals/Urinary Tract: The adrenal glands are unremarkable. Right renal calculus but no obstructing ureteral calculi or bladder calculi. No obvious renal or bladder mass. The bladder is decompressed by Foley catheter. Stomach/Bowel: The stomach, duodenum, small bowel and colon are grossly normal without oral contrast. No inflammatory changes, mass lesions or obstructive findings. The terminal ileum and appendix are normal. Moderate distention of the colon with air-fluid levels suggesting diarrhea. No colonic wall thickening or low colonic obstruction. Vascular/Lymphatic: Atherosclerotic calcifications involving the aorta iliac arteries. No aneurysm. No mesenteric or retroperitoneal mass or adenopathy. Reproductive: The prostate gland and seminal vesicles are grossly normal. Other: No pelvic mass, adenopathy or free pelvic fluid collections. No findings for abdominal or pelvic abscess. Musculoskeletal: No significant bony findings. Moderate degenerative changes involving the lumbar spine. IMPRESSION: 1. Moderate diffuse colonic distention with air-fluid levels all way to the rectum suggesting diarrhea the but  no wall thickening or obstructive findings. No significant small bowel findings. 2. No significant findings in the chest. No pneumonia or abscess. Stable emphysematous changes and pulmonary scarring. 3. The no intra-abdominal/intrapelvic abscess. 4. Body wall edema suggesting anasarca. 5. Cholelithiasis. 6. Stable hepatic cysts. Electronically Signed   By: Marijo Sanes M.D.   On: 02/17/2016 19:20   Ct Chest Wo Contrast  Result Date: 02/17/2016 CLINICAL DATA:  Fever of unknown origin. EXAM: CT CHEST, ABDOMEN AND PELVIS WITHOUT CONTRAST TECHNIQUE: Multidetector CT imaging of the chest, abdomen and pelvis was performed following the standard protocol without IV contrast. COMPARISON:  Chest CT 08/18/2013 FINDINGS: CT CHEST FINDINGS Chest wall: No chest wall mass, supraclavicular or axillary lymphadenopathy. The the thyroid gland is grossly normal.  A left-sided PICC line is noted. Cardiovascular: Endotracheal tube and NG tubes are in good position. No complicating features. The heart is normal in size. No pericardial effusion. There is tortuosity, ectasia and calcification of the thoracic aorta. No focal aneurysm. Scattered coronary artery calcifications. Mediastinum/Nodes: No mediastinal or hilar mass or adenopathy. The esophagus is grossly normal. Lungs/Pleura: Biapical pleural and parenchymal scarring changes, left greater than right, stable. Severe emphysematous changes and pulmonary scarring. Stable 6 mm nodular density in the right upper lobe adjacent to an area of pleural thickening. No new worrisome pulmonary lesions or acute pulmonary findings. No pulmonary infiltrate or abscess. Small amount of pleural fluid on the right. Musculoskeletal: No significant bony findings. No evidence of diskitis or osteomyelitis. CT ABDOMEN PELVIS FINDINGS Hepatobiliary: Stable right hepatic lobe cyst. No worrisome hepatic lesions or intrahepatic biliary dilatation. Layering gallstones are noted in the gallbladder. No common  bile duct dilatation. Pancreas: No mass, inflammation or ductal dilatation. Spleen: Normal size.  No focal lesions. Adrenals/Urinary Tract: The adrenal glands are unremarkable. Right renal calculus but no obstructing ureteral calculi or bladder calculi. No obvious renal or bladder mass. The bladder is decompressed by Foley catheter. Stomach/Bowel: The stomach, duodenum, small bowel and colon are grossly normal without oral contrast. No inflammatory changes, mass lesions or obstructive findings. The terminal ileum and appendix are normal. Moderate distention of the colon with air-fluid levels suggesting diarrhea. No colonic wall thickening or low colonic obstruction. Vascular/Lymphatic: Atherosclerotic calcifications involving the aorta iliac arteries. No aneurysm. No mesenteric or retroperitoneal mass or adenopathy. Reproductive: The prostate gland and seminal vesicles are grossly normal. Other: No pelvic mass, adenopathy or free pelvic fluid collections. No findings for abdominal or pelvic abscess. Musculoskeletal: No significant bony findings. Moderate degenerative changes involving the lumbar spine. IMPRESSION: 1. Moderate diffuse colonic distention with air-fluid levels all way to the rectum suggesting diarrhea the but no wall thickening or obstructive findings. No significant small bowel findings. 2. No significant findings in the chest. No pneumonia or abscess. Stable emphysematous changes and pulmonary scarring. 3. The no intra-abdominal/intrapelvic abscess. 4. Body wall edema suggesting anasarca. 5. Cholelithiasis. 6. Stable hepatic cysts. Electronically Signed   By: Marijo Sanes M.D.   On: 02/17/2016 19:20    Medications:  Prior to Admission:  Prescriptions Prior to Admission  Medication Sig Dispense Refill Last Dose  . albuterol (PROVENTIL HFA;VENTOLIN HFA) 108 (90 Base) MCG/ACT inhaler Inhale 1-2 puffs into the lungs every 6 (six) hours as needed for wheezing or shortness of breath. 1 Inhaler 0  Past Week at Unknown time  . albuterol (PROVENTIL) (2.5 MG/3ML) 0.083% nebulizer solution Take 3 mLs (2.5 mg total) by nebulization every 6 (six) hours as needed for wheezing or shortness of breath. 150 mL 1 Past Week at Unknown time  . ALPRAZolam (XANAX) 1 MG tablet Take 1 tablet (1 mg total) by mouth 3 (three) times daily as needed for anxiety. 90 tablet 1 Past Week at Unknown time  . amLODipine (NORVASC) 10 MG tablet Take 10 mg by mouth daily.   Past Week at Unknown time  . budesonide-formoterol (SYMBICORT) 160-4.5 MCG/ACT inhaler Inhale 2 puffs into the lungs 2 (two) times daily.   Past Week at Unknown time  . FLUoxetine (PROZAC) 40 MG capsule Take 1 capsule (40 mg total) by mouth daily. 30 capsule 3 Past Week at Unknown time  . ibuprofen (ADVIL,MOTRIN) 200 MG tablet Take 200 mg by mouth every 6 (six) hours as needed for headache, mild pain or moderate  pain. For pain    Past Week at Unknown time  . Ivermectin 0.5 % LOTN Apply thoroughly to dry hair and scalp; leave lotion on hair for 10 minutes, and then rinse with water. 1 Tube 1 Past Week at Unknown time  . loratadine (CLARITIN) 10 MG tablet Take 1 tablet (10 mg total) by mouth daily. 30 tablet 11 Past Month at Unknown time  . omeprazole (PRILOSEC) 20 MG capsule take 1 capsule by mouth once daily 30 capsule 3 Past Month at Unknown time  . propranolol (INDERAL) 10 MG tablet take 1 tablet by mouth twice a day for TREMOR 60 tablet 3 Past Week at Unknown time  . rosuvastatin (CRESTOR) 10 MG tablet take 1 tablet by mouth once daily 30 tablet 3 Past Week at Unknown time  . tiotropium (SPIRIVA) 18 MCG inhalation capsule Place 18 mcg into inhaler and inhale daily.   Past Week at Unknown time  . vitamin B-12 (CYANOCOBALAMIN) 1000 MCG tablet Take 1,000 mcg by mouth daily.   Past Week at Unknown time  . nitroGLYCERIN (NITROSTAT) 0.4 MG SL tablet Place 0.4 mg under the tongue every 5 (five) minutes as needed for chest pain.   unknown  . [DISCONTINUED]  predniSONE (DELTASONE) 20 MG tablet 2 tabs po daily x 4 days 8 tablet 0    Scheduled: . chlorhexidine gluconate (MEDLINE KIT)  15 mL Mouth Rinse BID  . Chlorhexidine Gluconate Cloth  6 each Topical Daily  . enoxaparin (LOVENOX) injection  40 mg Subcutaneous Q24H  . famotidine (PEPCID) IV  20 mg Intravenous Q12H  . fentaNYL (SUBLIMAZE) injection  50 mcg Intravenous Once  . furosemide  40 mg Intravenous Q12H  . insulin aspart  0-9 Units Subcutaneous Q4H  . ipratropium-albuterol  3 mL Nebulization Q6H  . mouth rinse  15 mL Mouth Rinse QID  . methylPREDNISolone (SOLU-MEDROL) injection  60 mg Intravenous Q8H  . nicotine  21 mg Transdermal Daily  . polyvinyl alcohol  1 drop Both Eyes Q4H  . potassium chloride  40 mEq Oral Daily  . propofol  20 mg Intravenous Once  . propofol (DIPRIVAN) infusion  5-80 mcg/kg/min Intravenous Once  . sodium chloride flush  10-40 mL Intracatheter Q12H   Continuous: . feeding supplement (VITAL AF 1.2 CAL) 1,000 mL (02/18/16 1636)  . fentaNYL infusion INTRAVENOUS 100 mcg/hr (02/19/16 0814)  . propofol (DIPRIVAN) infusion 20 mcg/kg/min (02/19/16 0815)   QXI:[id number], albuterol, bisacodyl, fentaNYL, sodium chloride, sodium chloride flush  Assesment: He was admitted with COPD exacerbation acute on chronic hypoxic and hypercapnic respiratory failure and has been on a ventilator since admission. He's had fever and is not really clear what that is from. His white blood count has come up some today. He has had cultures done. He had trouble with abdominal distention but that's better. Principal Problem:   COPD exacerbation ([place]) Active Problems:   COPD (chronic obstructive pulmonary disease) (HCC)   Essential hypertension, benign   Respiratory failure (HCC)   Acute on chronic respiratory failure with hypercapnia (HCC)   Respiratory disorder with ventilator dependence (HCC)   Fever   Acute bronchitis   Palliative care encounter   Goals of care,  counseling/discussion   DNR (do not resuscitate) discussion   Ventilator dependence ([place])   Acute respiratory failure with hypoxia and hypercarbia (HCC)   Elevated troponin   FUO (fever of unknown origin)   Acute on chronic respiratory failure with hypoxia and hypercapnia (HCC)   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 Family meeting tomorrow. Prognosis is extremely poor    LOS: 12 days   Rakayla Ricklefs L 02/19/2016, 8:52 AM

## 2016-02-19 NOTE — Progress Notes (Signed)
PROGRESS NOTE  [name] URK:[id number] DOB: 1940-04-06 DOA: 01/16/2016 PCP: Vic Blackbird, MD  Brief History:  76 year old man admitted to the hospital on 1/23 due to shortness of breath, was intubated in the emergency department. Continued difficulty with resp acidosis on vent. Abdominal distention but Xray without obstruction. Tube feeds have been held. 1/28: ABG improved. With significant increased volume and changes on CXR that may represent volume overload. Weaning attempts have been unsuccessful initially due to respiratory acidosis and now due to tachycardia every time sedation is decreased. Palliative care and rounding physicians have had multiple family discussions with some family members seem to be unrealistic with expectations. Patient has very poor prognosis. Pulmonary medicine continues to follow.  Interim Events -Overnight, the patient had hypotension. His propofol and fentanyl were discontinued for at least 1 hour. As a result, patient became more tachycardic and tachypneic. 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 Luan Pulling input and recommendations-->if unable to wean by 02/20/16-->will need trach/PEG pending family meeting on 02/20/16 -has failed multiple weaning attempts -appreciate Palliative medicine continued discussions  New Fever -2/1 UA--no pyuria -2/1 blood cultures x 2 sets--neg to date -tracheal aspirate/culture--pending -procalcitonin <0.10 -personally reviewed Chest x-ray--negative for infiltrates; shows chronic interstitial prominence -d/c antimicrobials and observe clinically -2/2--CT chest--no infiltrates. Stable right upper lobe 6 cm nodular density.  Emphysema with pulmonary scarring -2/2--CT- abdomen/pelvis--moderately distended colon with air-fluid levels; no colonic wall thickening. No hydronephrosis or abscess. -Continue to observe off antibiotics -with mild transaminasemia and cholelithiasis-->RUQ ultrasound -2/4--WBC climbing, check cdiff, repeat blood cultures  COPD Exacerbation -continue solumedrol -continue DuoNebs -appreciate Dr. Kathaleen Grinder followup  HCAP -check procalcitonin < 0.10 -finished 8 days vanc (2/1) Lajean Silvius (2/2) -d/c antimicrobials and observe  Elevated troponins -Due to demand ischemia -02/10/2016 echo EF 55-60%, grade 1 daily, technically difficult -No plans for aggressive cardiac workup  Paroxsymal Afib--new onset -secondary to infection and respiratory failure/COPD -presently in sinus  Lower extremity edema -check albumin--2.8 -urine protein/creatinine ratio--unable to calculate as urine protein <6 -increasedlasix per Dr. Luan Pulling -daily weights--NEG 10lbs since increased lasix; NEG 9L in past 72hours -Am BMP  FEN -agree with restart enteral feeds-->tolerating @50cc /hr -hypernatremia due to furosemide -Hypokalemia-->replete -Magnesium 2.2 -diuresis per Dr. Luan Pulling  Hyperglycemia -likely due to steroids -start SSI  Goals of Care -Appreciate palliative medicine follow-up--discussed with Ms. Hulan Fray 02/17/16 -Patient is now no defibrillation, no chest compressions -complex family dynamics     Disposition Plan: Remain in ICU Family Communication: updated wife 2/4--Total time spent 35 minutes. Greater than 50% spent face to face counseling and coordinating care.   Consultants: Dr. Luan Pulling  Code Status: Partial DNR--No CPR, no defibrillation  DVT Prophylaxis:  Lovenox   Procedures:  As Listed in Progress Note Above  Antibiotics: Zosyn 02/09/2016>>>02/17/16 Vancomycin 02/09/2016>>>02/16/16   Subjective: Patient remains sedated on ventilator. Had  3  loose stools in the past 24 hours. No vomiting, but had some distress off of sedation.  Objective: Vitals:   02/19/16 0500 02/19/16 0520 02/19/16 0530 02/19/16 0600  BP:  (!) 168/99 (!) 161/102 (!) 173/104  Pulse:      Resp: (!) 26 (!) 26 (!) 26 (!) 28  Temp: (!) 101 F (38.3 C) (!) 100.8 F (38.2 C) (!)  100.8 F (38.2 C) (!) 100.8 F (38.2 C)  TempSrc:      SpO2:      Weight: 63.2 kg (139 lb 5.3 oz)     Height:        Intake/Output Summary (Last 24 hours) at 02/19/16 0801 Last data filed at 02/19/16 0600  Gross per 24 hour  Intake          1479.69 ml  Output             2750 ml  Net         -1270.31 ml   Weight change: -0.5 kg (-1 lb 1.6 oz) Exam:   General:  Pt sedated on ventilator not in acute distress  HEENT: No icterus, No thrush, No neck mass, [place]/AT  Cardiovascular: RRR, S1/S2, no rubs, no gallops  Respiratory: Diminished breath sounds. Bibasilar rales. No wheeze.  Abdomen: Soft/+BS,non distended, no guarding  Extremities: 1 + LE edema, No lymphangitis, No petechiae, No rashes, no synovitis   Data Reviewed: I have personally reviewed following labs and imaging studies Basic Metabolic Panel:  Recent Labs Lab 02/15/16 0417 02/16/16 0421 02/17/16 0416 02/18/16 0455 02/19/16 0500  NA 151* 153* 152* 153* 152*  K 3.6 3.0* 3.3* 3.4* 4.0  CL 116* 114* 112* 110 108  CO2 24 30 30  33* 32  GLUCOSE 141* 175* 194* 228* 204*  BUN 40* 49* 60* 63* 67*  CREATININE 1.23 1.42* 1.56* 1.43* 1.31*  CALCIUM 7.8* 7.6* 7.5* 7.1* 7.5*  MG  --  2.2  --   --   --    Liver Function Tests:  Recent Labs Lab 02/15/16 0417 02/18/16 0455 02/19/16 0500  AST 30 32 29  ALT 51 83* 70*  ALKPHOS 37* 43 56  BILITOT 1.0 0.8 0.8  PROT 5.1* 5.0* 5.5*  ALBUMIN 2.8* 2.6* 2.8*   No results for input(s): LIPASE, AMYLASE in the last 168 hours. No results for input(s): AMMONIA in the last 168 hours. Coagulation Profile: No results for input(s): INR, PROTIME in the last 168  hours. CBC:  Recent Labs Lab 02/15/16 0417 02/18/16 0455 02/19/16 0500  WBC 6.1 12.3* 23.6*  NEUTROABS  --  11.3*  --   HGB 13.8 13.6 15.6  HCT 41.9 43.2 49.2  MCV 95.7 98.9 99.4  PLT 159 109* 144*   Cardiac Enzymes: No results for input(s): CKTOTAL, CKMB, CKMBINDEX, TROPONINI in the last 168 hours. BNP: Invalid input(s): POCBNP CBG:  Recent Labs Lab 02/18/16 1603 02/18/16 1930 02/18/16 2354 02/19/16 0349 02/19/16 0732  GLUCAP 202* 176* 212* 175* 173*   HbA1C: No results for input(s): HGBA1C in the last 72 hours. Urine analysis:    Component Value Date/Time   COLORURINE YELLOW 02/16/2016 0739   APPEARANCEUR CLEAR 02/16/2016 0739   LABSPEC 1.015 02/16/2016 0739   PHURINE 5.0 02/16/2016 0739   GLUCOSEU NEGATIVE 02/16/2016 0739   HGBUR SMALL (A) 02/16/2016 0739   BILIRUBINUR NEGATIVE 02/16/2016 0739   KETONESUR NEGATIVE 02/16/2016 0739   PROTEINUR NEGATIVE 02/16/2016 0739   UROBILINOGEN 0.2 04/19/2014 1946   NITRITE NEGATIVE 02/16/2016 0739   LEUKOCYTESUR NEGATIVE 02/16/2016 0739   Sepsis Labs: @LABRCNTIP (procalcitonin:4,lacticidven:4) ) Recent Results (from the past 240 hour(s))  Culture, blood (Routine X 2) w Reflex to ID Panel     Status: None   Collection Time: 02/09/16  8:24 AM  Result Value Ref Range Status   Specimen Description BLOOD LEFT HAND  Final   Special Requests BOTTLES DRAWN AEROBIC ONLY [place]  Final  Culture NO GROWTH 5 DAYS  Final   Report Status 02/14/2016 FINAL  Final  Culture, blood (Routine X 2) w Reflex to ID Panel     Status: None   Collection Time: 02/09/16  9:31 AM  Result Value Ref Range Status   Specimen Description BLOOD RIGHT HAND  Final   Special Requests BOTTLES DRAWN AEROBIC AND ANAEROBIC 6cc  Final   Culture NO GROWTH 5 DAYS  Final   Report Status 02/14/2016 FINAL  Final  Urine culture     Status: None   Collection Time: 02/16/16  7:39 AM  Result Value Ref Range Status   Specimen Description URINE, CLEAN CATCH  Final    Special Requests NONE  Final   Culture   Final    NO GROWTH Performed at [facility], [place] [address], [place], [place] [address]    Report Status 02/17/2016 FINAL  Final  Culture, blood (Routine X 2) w Reflex to ID Panel     Status: None (Preliminary result)   Collection Time: 02/16/16  7:56 AM  Result Value Ref Range Status   Specimen Description BLOOD RIGHT HAND  Final   Special Requests BOTTLES DRAWN AEROBIC AND ANAEROBIC 6CC  Final   Culture NO GROWTH 2 DAYS  Final   Report Status PENDING  Incomplete  Culture, blood (Routine X 2) w Reflex to ID Panel     Status: None (Preliminary result)   Collection Time: 02/16/16 10:01 AM  Result Value Ref Range Status   Specimen Description BLOOD RIGHT HAND  Final   Special Requests BOTTLES DRAWN AEROBIC ONLY 6CC  Final   Culture NO GROWTH 2 DAYS  Final   Report Status PENDING  Incomplete  Culture, respiratory (NON-Expectorated)     Status: None (Preliminary result)   Collection Time: 02/17/16 11:21 AM  Result Value Ref Range Status   Specimen Description TRACHEAL ASPIRATE  Final   Special Requests NONE  Final   Gram Stain   Final    RARE SQUAMOUS EPITHELIAL CELLS PRESENT MODERATE WBC PRESENT,BOTH PMN AND MONONUCLEAR FEW YEAST    Culture   Final    TOO YOUNG TO READ Performed at [facility], [place] [address] [place], [place] [address]    Report Status PENDING  Incomplete     Scheduled Meds: . chlorhexidine gluconate (MEDLINE KIT)  15 mL Mouth Rinse BID  . Chlorhexidine Gluconate Cloth  6 each Topical Daily  . enoxaparin (LOVENOX) injection  40 mg Subcutaneous Q24H  . famotidine (PEPCID) IV  20 mg Intravenous Q12H  . fentaNYL (SUBLIMAZE) injection  50 mcg Intravenous Once  . furosemide  40 mg Intravenous Q12H  . insulin aspart  0-9 Units Subcutaneous Q4H  . ipratropium-albuterol  3 mL Nebulization Q6H  . mouth rinse  15 mL Mouth Rinse QID  . methylPREDNISolone (SOLU-MEDROL) injection  60 mg Intravenous Q6H  .  nicotine  21 mg Transdermal Daily  . polyvinyl alcohol  1 drop Both Eyes Q4H  . potassium chloride  40 mEq Oral Daily  . propofol  20 mg Intravenous Once  . propofol (DIPRIVAN) infusion  5-80 mcg/kg/min Intravenous Once  . sodium chloride flush  10-40 mL Intracatheter Q12H   Continuous Infusions: . feeding supplement (VITAL AF 1.2 CAL) 1,000 mL (02/18/16 1636)  . fentaNYL infusion INTRAVENOUS 75 mcg/hr (02/19/16 0553)  . propofol (DIPRIVAN) infusion Stopped (02/19/16 0330)    Procedures/Studies: Ct Abdomen Pelvis Wo Contrast  Result Date: 02/17/2016 CLINICAL DATA:  Fever  of unknown origin. EXAM: CT CHEST, ABDOMEN AND PELVIS WITHOUT CONTRAST TECHNIQUE: Multidetector CT imaging of the chest, abdomen and pelvis was performed following the standard protocol without IV contrast. COMPARISON:  Chest CT 08/18/2013 FINDINGS: CT CHEST FINDINGS Chest wall: No chest wall mass, supraclavicular or axillary lymphadenopathy. The the thyroid gland is grossly normal. A left-sided PICC line is noted. Cardiovascular: Endotracheal tube and NG tubes are in good position. No complicating features. The heart is normal in size. No pericardial effusion. There is tortuosity, ectasia and calcification of the thoracic aorta. No focal aneurysm. Scattered coronary artery calcifications. Mediastinum/Nodes: No mediastinal or hilar mass or adenopathy. The esophagus is grossly normal. Lungs/Pleura: Biapical pleural and parenchymal scarring changes, left greater than right, stable. Severe emphysematous changes and pulmonary scarring. Stable 6 mm nodular density in the right upper lobe adjacent to an area of pleural thickening. No new worrisome pulmonary lesions or acute pulmonary findings. No pulmonary infiltrate or abscess. Small amount of pleural fluid on the right. Musculoskeletal: No significant bony findings. No evidence of diskitis or osteomyelitis. CT ABDOMEN PELVIS FINDINGS Hepatobiliary: Stable right hepatic lobe cyst. No  worrisome hepatic lesions or intrahepatic biliary dilatation. Layering gallstones are noted in the gallbladder. No common bile duct dilatation. Pancreas: No mass, inflammation or ductal dilatation. Spleen: Normal size.  No focal lesions. Adrenals/Urinary Tract: The adrenal glands are unremarkable. Right renal calculus but no obstructing ureteral calculi or bladder calculi. No obvious renal or bladder mass. The bladder is decompressed by Foley catheter. Stomach/Bowel: The stomach, duodenum, small bowel and colon are grossly normal without oral contrast. No inflammatory changes, mass lesions or obstructive findings. The terminal ileum and appendix are normal. Moderate distention of the colon with air-fluid levels suggesting diarrhea. No colonic wall thickening or low colonic obstruction. Vascular/Lymphatic: Atherosclerotic calcifications involving the aorta iliac arteries. No aneurysm. No mesenteric or retroperitoneal mass or adenopathy. Reproductive: The prostate gland and seminal vesicles are grossly normal. Other: No pelvic mass, adenopathy or free pelvic fluid collections. No findings for abdominal or pelvic abscess. Musculoskeletal: No significant bony findings. Moderate degenerative changes involving the lumbar spine. IMPRESSION: 1. Moderate diffuse colonic distention with air-fluid levels all way to the rectum suggesting diarrhea the but no wall thickening or obstructive findings. No significant small bowel findings. 2. No significant findings in the chest. No pneumonia or abscess. Stable emphysematous changes and pulmonary scarring. 3. The no intra-abdominal/intrapelvic abscess. 4. Body wall edema suggesting anasarca. 5. Cholelithiasis. 6. Stable hepatic cysts. Electronically Signed   By: Marijo Sanes M.D.   On: 02/17/2016 19:20   Dg Abd 1 View  Result Date: 02/10/2016 CLINICAL DATA:  76 year old male are with abdominal distention and concern for small bowel obstruction. Decreased bowel sounds. EXAM:  ABDOMEN - 1 VIEW COMPARISON:  None. FINDINGS: An enteric tube is partially visualized with tip in the proximal stomach. The side port of the enteric tube appears to be at the level of the gastroesophageal junction. There is no bowel dilatation or evidence of obstruction. Air is noted within the stomach and colon. There is thickened appearance of the gastric wall which may be artifactual. Clinical correlation is recommended to evaluate for gastritis. No free air or radiopaque calculi identified. There is degenerative changes of the spine. No acute osseous pathology identified. IMPRESSION: No bowel dilatation or evidence of obstruction. Enteric tube with tip in the proximal stomach. Artifact versus less likely thickening of the gastric wall. Clinical correlation is recommended to evaluate for gastritis. Electronically Signed   By: Milas Hock  Radparvar M.D.   On: 02/10/2016 23:55   Ct Head Wo Contrast  Result Date: 01/23/2016 CLINICAL DATA:  Unresponsive.  Possible unintentional overdose. EXAM: CT HEAD WITHOUT CONTRAST TECHNIQUE: Contiguous axial images were obtained from the base of the skull through the vertex without intravenous contrast. COMPARISON:  Head CT 04/19/2014 and MRI brain 04/19/2014 FINDINGS: Brain: Stable age related cerebral atrophy, ventriculomegaly and periventricular white matter disease. No extra-axial fluid collections are identified. No CT findings for acute hemispheric infarction or intracranial hemorrhage. No mass lesions. The brainstem and cerebellum are normal. Vascular: Stable vascular calcifications. No hyperdense vessels or obvious aneurysm. Skull: No skull fracture or bone lesions. Sinuses/Orbits: Extensive chronic paranasal sinus disease mainly involving the ethmoid air cells, left maxillary sinus and left half of the sphenoid sinus. The mastoid air cells and middle ear cavities are clear. The globes are intact. Other: No scalp lesions or scalp hematoma. IMPRESSION: 1. Age related  cerebral atrophy, ventriculomegaly and periventricular white matter disease, unchanged when compared to prior examination. 2. No acute intracranial findings or mass lesions. 3. Chronic sinus disease. Electronically Signed   By: Marijo Sanes M.D.   On: 02/11/2016 17:00   Ct Chest Wo Contrast  Result Date: 02/17/2016 CLINICAL DATA:  Fever of unknown origin. EXAM: CT CHEST, ABDOMEN AND PELVIS WITHOUT CONTRAST TECHNIQUE: Multidetector CT imaging of the chest, abdomen and pelvis was performed following the standard protocol without IV contrast. COMPARISON:  Chest CT 08/18/2013 FINDINGS: CT CHEST FINDINGS Chest wall: No chest wall mass, supraclavicular or axillary lymphadenopathy. The the thyroid gland is grossly normal. A left-sided PICC line is noted. Cardiovascular: Endotracheal tube and NG tubes are in good position. No complicating features. The heart is normal in size. No pericardial effusion. There is tortuosity, ectasia and calcification of the thoracic aorta. No focal aneurysm. Scattered coronary artery calcifications. Mediastinum/Nodes: No mediastinal or hilar mass or adenopathy. The esophagus is grossly normal. Lungs/Pleura: Biapical pleural and parenchymal scarring changes, left greater than right, stable. Severe emphysematous changes and pulmonary scarring. Stable 6 mm nodular density in the right upper lobe adjacent to an area of pleural thickening. No new worrisome pulmonary lesions or acute pulmonary findings. No pulmonary infiltrate or abscess. Small amount of pleural fluid on the right. Musculoskeletal: No significant bony findings. No evidence of diskitis or osteomyelitis. CT ABDOMEN PELVIS FINDINGS Hepatobiliary: Stable right hepatic lobe cyst. No worrisome hepatic lesions or intrahepatic biliary dilatation. Layering gallstones are noted in the gallbladder. No common bile duct dilatation. Pancreas: No mass, inflammation or ductal dilatation. Spleen: Normal size.  No focal lesions. Adrenals/Urinary  Tract: The adrenal glands are unremarkable. Right renal calculus but no obstructing ureteral calculi or bladder calculi. No obvious renal or bladder mass. The bladder is decompressed by Foley catheter. Stomach/Bowel: The stomach, duodenum, small bowel and colon are grossly normal without oral contrast. No inflammatory changes, mass lesions or obstructive findings. The terminal ileum and appendix are normal. Moderate distention of the colon with air-fluid levels suggesting diarrhea. No colonic wall thickening or low colonic obstruction. Vascular/Lymphatic: Atherosclerotic calcifications involving the aorta iliac arteries. No aneurysm. No mesenteric or retroperitoneal mass or adenopathy. Reproductive: The prostate gland and seminal vesicles are grossly normal. Other: No pelvic mass, adenopathy or free pelvic fluid collections. No findings for abdominal or pelvic abscess. Musculoskeletal: No significant bony findings. Moderate degenerative changes involving the lumbar spine. IMPRESSION: 1. Moderate diffuse colonic distention with air-fluid levels all way to the rectum suggesting diarrhea the but no wall thickening or obstructive findings. No  significant small bowel findings. 2. No significant findings in the chest. No pneumonia or abscess. Stable emphysematous changes and pulmonary scarring. 3. The no intra-abdominal/intrapelvic abscess. 4. Body wall edema suggesting anasarca. 5. Cholelithiasis. 6. Stable hepatic cysts. Electronically Signed   By: Marijo Sanes M.D.   On: 02/17/2016 19:20   Dg Chest Port 1 View  Result Date: 02/16/2016 CLINICAL DATA:  Respiratory failure. EXAM: PORTABLE CHEST 1 VIEW COMPARISON:  02/13/2016 . FINDINGS: Endotracheal tube and NG tube in stable position. Mild right base infiltrate cannot be excluded. No pleural effusion or pneumothorax. No acute bony abnormality . IMPRESSION: 1.  Lines and tubes in stable position. 2.  Mild right base infiltrate cannot be excluded . Electronically  Signed   By: Marcello Moores  Register   On: 02/16/2016 07:32   Dg Chest Port 1 View  Result Date: 02/13/2016 CLINICAL DATA:  Follow-up chest x-ray, ventilated patient. History of COPD, acute on chronic respiratory failure current smoker. EXAM: PORTABLE CHEST 1 VIEW COMPARISON:  Portable chest x-ray of February 12, 2016 FINDINGS: The patient is rotated toward the right. The lungs are hyperinflated. There is no focal infiltrate. There is a trace of blunting of the costophrenic angles. The lung markings are coarse in the right upper lobe. There is no pneumothorax nor pneumomediastinum. The heart and pulmonary vascularity are normal. The endotracheal tube tip lies 6 cm above the carina. The esophagogastric tube tip projects below the inferior margin of the image. IMPRESSION: COPD, coarse right upper lobe interstitial markings may reflect subsegmental atelectasis or fibrosis. Trace bilateral pleural effusions. No alveolar pneumonia nor pulmonary edema. The support tubes are in reasonable position. Electronically Signed   By: Tamarcus Condie  [place] M.D.   On: 02/13/2016 07:33   Dg Chest Port 1 View  Result Date: 02/12/2016 CLINICAL DATA:  Shortness of Breath EXAM: PORTABLE CHEST 1 VIEW COMPARISON:  02/11/2016 FINDINGS: There is hyperinflation of the lungs compatible with COPD. Endotracheal tube and NG tube are unchanged. No confluent opacities or effusions. Heart is normal size. IMPRESSION: COPD.  No acute findings. Electronically Signed   By: Rolm Baptise M.D.   On: 02/12/2016 07:52   Dg Chest Port 1 View  Result Date: 02/11/2016 CLINICAL DATA:  Short of breath. Ventilated patient. Subsequent encounter. EXAM: PORTABLE CHEST 1 VIEW COMPARISON:  02/10/2016 FINDINGS: Endotracheal tube and nasal/orogastric tube are stable. Lungs are hyperexpanded. There changes of emphysema with decrease markings in the peripheral lungs most evident on the left. No evidence of pneumonia or pulmonary edema. No pleural effusion or pneumothorax.  IMPRESSION: 1. No acute findings in the lungs. 2. COPD. 3. Support apparatus is stable and well positioned. Electronically Signed   By: Lajean Manes M.D.   On: 02/11/2016 08:04   Dg Chest Port 1 View  Result Date: 02/10/2016 CLINICAL DATA:  Ventilator dependent EXAM: PORTABLE CHEST 1 VIEW COMPARISON:  02/09/2016 FINDINGS: Endotracheal tube and nasogastric catheter are again seen and stable. Cardiac shadow is within normal limits. Previously seen patchy changes in the right upper lobe have resolved in the interval. No new focal infiltrate is seen. No bony abnormality is noted. IMPRESSION: No acute abnormality seen. Tubes and lines as described. Electronically Signed   By: Inez Catalina M.D.   On: 02/10/2016 09:01   Dg Chest Port 1 View  Result Date: 02/09/2016 CLINICAL DATA:  Irregular heartbeat.  Chest pain . EXAM: PORTABLE CHEST 1 VIEW COMPARISON:  01/21/2016. FINDINGS: Endotracheal tube noted with tip 3 cm above the carina. NG tube noted  with tip below left hemidiaphragm. Mild infiltrate in the right upper lobe. Questionable nodular density is noted in the right apex. Small bilateral pleural effusions. Apical pleural-parenchymal thickening and cystic changes again noted on the right. These changes are consistent with scarring. No pneumothorax. Heart size stable. IMPRESSION: 1.  Endotracheal tube and NG tube in good anatomic position. 2. Questionable nodule right upper lobe. Questionable right upper lobe infiltrate. Follow-up chest x-rays recommended to demonstrate clearing. Electronically Signed   By: Marcello Moores  Register   On: 02/09/2016 08:11   Dg Chest Port 1 View  Result Date: 01/16/2016 CLINICAL DATA:  Respiratory failure EXAM: PORTABLE CHEST 1 VIEW COMPARISON:  10/27/2015 FINDINGS: Endotracheal tube placed. Tip is 4.5 cm from the carina. NG tube tip is beyond the gastroesophageal junction. Lungs are hyperaerated. New nodular density at the right apex measures less than 1 cm. No pneumothorax. Small  right pleural effusion. IMPRESSION: Endotracheal and NG tubes placed. Subcentimeter nodule at the right apex has developed. Small right pleural effusion. Electronically Signed   By: Marybelle Killings M.D.   On: 02/12/2016 16:09    Montell Leopard, DO  [facility] Pager [phone]  If 7PM-7AM, please contact night-coverage [email or web address] Password [email or web address] 02/19/2016, 8:01 AM   LOS: 12 days

## 2016-02-20 LAB — GLUCOSE, CAPILLARY
GLUCOSE-CAPILLARY: 208 mg/dL — AB (ref 65–99)
GLUCOSE-CAPILLARY: 178 mg/dL — AB (ref 65–99)
Glucose-Capillary: 201 mg/dL — ABNORMAL HIGH (ref 65–99)

## 2016-02-20 LAB — CBC
HCT: 43.9 % (ref 39.0–52.0)
Hemoglobin: 13.8 g/dL (ref 13.0–17.0)
MCH: 31.4 pg (ref 26.0–34.0)
MCHC: 31.4 g/dL (ref 30.0–36.0)
MCV: 100 fL (ref 78.0–100.0)
Platelets: 102 10*3/uL — ABNORMAL LOW (ref 150–400)
RBC: 4.39 MIL/uL (ref 4.22–5.81)
RDW: 15.6 % — AB (ref 11.5–15.5)
WBC: 15.6 10*3/uL — ABNORMAL HIGH (ref 4.0–10.5)

## 2016-02-20 LAB — BASIC METABOLIC PANEL
Anion gap: 7 (ref 5–15)
BUN: 76 mg/dL — AB (ref 6–20)
CALCIUM: 7.3 mg/dL — AB (ref 8.9–10.3)
CO2: 36 mmol/L — AB (ref 22–32)
Chloride: 110 mmol/L (ref 101–111)
Creatinine, Ser: 1.46 mg/dL — ABNORMAL HIGH (ref 0.61–1.24)
GFR calc Af Amer: 52 mL/min — ABNORMAL LOW (ref >60)
GFR, EST NON AFRICAN AMERICAN: 45 mL/min — AB (ref >60)
GLUCOSE: 209 mg/dL — AB (ref 65–99)
POTASSIUM: 4 mmol/L (ref 3.5–5.1)
Sodium: 153 mmol/L — ABNORMAL HIGH (ref 135–145)

## 2016-02-20 MED ORDER — FENTANYL CITRATE (PF) 2500 MCG/50ML IJ SOLN
INTRAMUSCULAR | Status: AC
  Filled 2016-02-20: qty 50

## 2016-02-20 NOTE — Procedures (Signed)
Extubation Procedure Note  Patient Details:   Name: [name] DOB: 26-Aug-1940 MRN: [id number]   Airway Documentation:  Airway 7.5 mm (Active)  Secured at (cm) 24 cm 02/20/2016  4:59 PM  Measured From Lips 02/20/2016  4:59 PM  [place] 02/20/2016  4:59 PM  Secured By [employer] 02/20/2016  4:59 PM  Tube Holder Repositioned Yes 02/20/2016  4:59 PM  Cuff Pressure (cm H2O) 24 cm H2O 02/19/2016  9:02 PM  Site Condition Dry 02/20/2016  4:59 PM    Evaluation  O2 sats: monitors dcd Complications: no issues noted Patient did tolerate procedure well Bilateral Breath Sounds: Diminished  This was a terminal extubation to 2lpm [place]. No issues. Noted. Family now in room.  Lucianne Muss 02/20/2016, 1840

## 2016-02-20 NOTE — Progress Notes (Signed)
Inpatient Diabetes Program Recommendations  AACE/ADA: New Consensus Statement on Inpatient Glycemic Control (2015)  Target Ranges:  Prepandial:   less than 140 mg/dL      Peak postprandial:   less than 180 mg/dL (1-2 hours)      Critically ill patients:  140 - 180 mg/dL   Lab Results  Component Value Date   GLUCAP 201 (H) 02/20/2016    Review of Glycemic Control Results for [name], [name] (MRN [id number]) as of 02/20/2016 13:38  Ref. Range 02/19/2016 19:35 02/20/2016 00:01 02/20/2016 04:28 02/20/2016 07:17 02/20/2016 10:48  Glucose-Capillary Latest Ref Range: 65 - 99 mg/dL 172 (H) 235 (H) 208 (H) 178 (H) 201 (H)   Inpatient Diabetes Program Recommendations:    May consider adding Lantus 8 units daily.  Thanks, Adah Perl, RN, BC-ADM Inpatient Diabetes Coordinator Pager [phone] ([id number])

## 2016-02-20 NOTE — Progress Notes (Signed)
PROGRESS NOTE  [name] LEX:[id number] DOB: 1940/07/27 DOA: 02/13/2016 PCP: Vic Blackbird, MD  Brief History:  76 year old man admitted to the hospital on 1/23 due to shortness of breath, was intubated in the emergency department. Continued difficulty with resp acidosis on vent. Abdominal distention but Xray without obstruction. Tube feeds have been held. 1/28: ABG improved. With significant increased volume and changes on CXR that may represent volume overload. Weaning attempts have been unsuccessful initially due to respiratory acidosis and now due to tachycardia and respiratory distress every time sedation is decreased. Palliative care and rounding physicians have had multiple family discussions with some family members seem to be unrealistic with expectations. Patient has very poor prognosis. Pulmonary medicine continues to follow.  Interim Events -Overnight 02/18/16, the patient had hypotension. His propofol and fentanyl were discontinued for at least 1 hour. As a result, patient became more tachycardic and tachypneic. The fentanylwas restarted, but pt continues to be tachypneic-->restart profol with improvement in RR and BP.  The patient continued to fail weaning multiple attempts. Conversations were held with the patient's wife and family on daily basis. The patient continued to spike fevers on a daily basis without a clear source. Palliative medicine continued to follow. Ultimately, the patient's wife who is the primary decision maker felt to transition to comfort care approach was most reasonable.  She did not wish to pursue tracheostomy and  Gtube placement.  Therefore, after discussion with pulmonary medicine,a decision for terminal extubation to be performed on 02/20/2016.   Assessment/Plan: Acute on chronic respiratory failure with hypoxia--ventilator dependent -Thought to be due to COPD exacerbation with a component of HCAP -Influenza PCR negative -Continue  Solu-Medrol -Appreciate Dr. Luan Pulling input and recommendations-->if unable to wean by 02/20/16-->will need trach/PEG vs terminal extubation  -has failed multipleweaning attempts -appreciate Palliative medicine continued discussions - patient's wife who is the primary decision maker felt to transition to comfort care approach was most reasonable after multiple discussions with palliative medicine  -Therefore, the decision was made for terminal extubation planned on 02/20/2016  New Fever -2/1 UA--no pyuria -2/1 blood cultures x 2 sets--neg to date -tracheal aspirate/culture--pending -procalcitonin <0.10 -personally reviewed Chest x-ray--negative for infiltrates; shows chronic interstitial prominence -d/c antimicrobials and observe clinically -2/2--CT chest--no infiltrates. Stable right upper lobe 6 cm nodular density. Emphysema with pulmonary scarring -2/2--CT- abdomen/pelvis--moderately distended colon with air-fluid levels; no colonic wall thickening. No hydronephrosis or abscess. -Continue to observe off antibiotics -with mild transaminasemia and cholelithiasis-->RUQ ultrasound--> cholelithiasis with sludge and wall thickening without pericholecystic fluid. -Further workup was not undertaken regarding the patient's gallbladder as the patient's focus of care was to transition to focus on his comfort. -2/4--WBC climbing--check cdiff--neg, repeat blood cultures-neg to date  COPD Exacerbation -continue solumedrol -continue DuoNebs -appreciate Dr. Kathaleen Grinder followup  HCAP -check procalcitonin < 0.10 -finished 8 days vanc (2/1) /zosyn (2/2) -d/c antimicrobials and observe  Elevated troponins -Due to demand ischemia -02/10/2016 echo EF 55-60%, grade 1 daily, technically difficult -No plans for aggressive cardiac workup  Paroxsymal Afib--new onset -secondary to infection and respiratory failure/COPD -presently in sinus  Lower extremity edema -check albumin--2.8 -urine  protein/creatinine ratio--unable to calculate as urine protein <6 -increasedlasix per Dr. Luan Pulling -daily weights--NEG 10lbs since increased lasix; NEG 11.8L in past 72hours  FEN -agree with restart enteral feeds-->tolerating _0 /hr -hypernatremia due to furosemide -Hypokalemia-->replete -Magnesium 2.2 -diuresis per Dr. Luan Pulling  Hyperglycemia -likely due to steroids -start SSI  Goals of Care -Appreciate palliative  medicine follow-up--discussed with Ms. Hulan Fray 02/17/16 -Patient is now no defibrillation, no chest compressions -complex family dynamics - patient's wife who is the primary decision maker felt to transition to comfort care approach was most reasonable. Therefore, after discussion with pulmonary medicine, terminal extrubation was decided upon on 02/20/2016.       Disposition Plan:   In hospital death  Family Communication:   Wife updated at bedside  Consultants:  pulmonary  Code Status:   DNR  DVT Prophylaxis:[place] Lovenox   Procedures: As Listed in Progress Note Above  Antibiotics: None    Subjective: Patient failed another weaning attempt on 02/19/2016. No reports of vomiting, respiratory distress, uncontrolled pain. The patient had another loose stool.  Objective: Vitals:   02/20/16 1200 02/20/16 1300 02/20/16 1455 02/20/16 1659  BP: 121/70 106/63    Pulse:      Resp: 20 20    Temp: (!) 101.1 F (38.4 C) (!) 101.1 F (38.4 C)    TempSrc:      SpO2:   97% 97%  Weight:      Height:        Intake/Output Summary (Last 24 hours) at 02/20/16 1744 Last data filed at 02/20/16 1300  Gross per 24 hour  Intake          1053.09 ml  Output             1400 ml  Net          -346.91 ml   Weight change: -2.9 kg (-6 lb 6.3 oz) Exam:   General:  Pt is intubated and sedated, not in acute distress  HEENT: No icterus, No thrush, No neck mass, [place]/AT  Cardiovascular: RRR, S1/S2, no rubs, no gallops  Respiratory: Diminished breath sounds bilateral.  Bibasilar rales.  Abdomen: Soft/+BS, non tender, non distended, no guarding  Extremities: 1 + LE edema, No lymphangitis, No petechiae, No rashes, no synovitis   Data Reviewed: I have personally reviewed following labs and imaging studies Basic Metabolic Panel:  Recent Labs Lab 02/16/16 0421 02/17/16 0416 02/18/16 0455 02/19/16 0500 02/20/16 0429  NA 153* 152* 153* 152* 153*  K 3.0* 3.3* 3.4* 4.0 4.0  CL 114* 112* 110 108 110  CO2 30 30 33* 32 36*  GLUCOSE 175* 194* 228* 204* 209*  BUN 49* 60* 63* 67* 76*  CREATININE 1.42* 1.56* 1.43* 1.31* 1.46*  CALCIUM 7.6* 7.5* 7.1* 7.5* 7.3*  MG 2.2  --   --   --   --    Liver Function Tests:  Recent Labs Lab 02/15/16 0417 02/18/16 0455 02/19/16 0500  AST 30 32 29  ALT 51 83* 70*  ALKPHOS 37* 43 56  BILITOT 1.0 0.8 0.8  PROT 5.1* 5.0* 5.5*  ALBUMIN 2.8* 2.6* 2.8*   No results for input(s): LIPASE, AMYLASE in the last 168 hours. No results for input(s): AMMONIA in the last 168 hours. Coagulation Profile: No results for input(s): INR, PROTIME in the last 168 hours. CBC:  Recent Labs Lab 02/15/16 0417 02/18/16 0455 02/19/16 0500 02/20/16 0429  WBC 6.1 12.3* 23.6* 15.6*  NEUTROABS  --  11.3*  --   --   HGB 13.8 13.6 15.6 13.8  HCT 41.9 43.2 49.2 43.9  MCV 95.7 98.9 99.4 100.0  PLT 159 109* 144* 102*   Cardiac Enzymes: No results for input(s): CKTOTAL, CKMB, CKMBINDEX, TROPONINI in the last 168 hours. BNP: Invalid input(s): POCBNP CBG:  Recent Labs Lab 02/19/16 1935 02/20/16 0001 02/20/16 0428 02/20/16 7262  02/20/16 1048  GLUCAP 172* 235* 208* 178* 201*   HbA1C: No results for input(s): HGBA1C in the last 72 hours. Urine analysis:    Component Value Date/Time   COLORURINE YELLOW 02/16/2016 0739   APPEARANCEUR CLEAR 02/16/2016 0739   LABSPEC 1.015 02/16/2016 0739   PHURINE 5.0 02/16/2016 0739   GLUCOSEU NEGATIVE 02/16/2016 0739   HGBUR SMALL (A) 02/16/2016 0739   BILIRUBINUR NEGATIVE 02/16/2016 0739    KETONESUR NEGATIVE 02/16/2016 0739   PROTEINUR NEGATIVE 02/16/2016 0739   UROBILINOGEN 0.2 04/19/2014 1946   NITRITE NEGATIVE 02/16/2016 0739   LEUKOCYTESUR NEGATIVE 02/16/2016 0739   Sepsis Labs: _0 (procalcitonin:4,lacticidven:4) ) Recent Results (from the past 240 hour(s))  Urine culture     Status: None   Collection Time: 02/16/16  7:39 AM  Result Value Ref Range Status   Specimen Description URINE, CLEAN CATCH  Final   Special Requests NONE  Final   Culture   Final    NO GROWTH Performed at [facility], [place] [address], [place], [place] [address]    Report Status 02/17/2016 FINAL  Final  Culture, blood (Routine X 2) w Reflex to ID Panel     Status: None (Preliminary result)   Collection Time: 02/16/16  7:56 AM  Result Value Ref Range Status   Specimen Description BLOOD RIGHT HAND  Final   Special Requests BOTTLES DRAWN AEROBIC AND ANAEROBIC 6CC  Final   Culture NO GROWTH 4 DAYS  Final   Report Status PENDING  Incomplete  Culture, blood (Routine X 2) w Reflex to ID Panel     Status: None (Preliminary result)   Collection Time: 02/16/16 10:01 AM  Result Value Ref Range Status   Specimen Description BLOOD RIGHT HAND  Final   Special Requests BOTTLES DRAWN AEROBIC ONLY 6CC  Final   Culture NO GROWTH 4 DAYS  Final   Report Status PENDING  Incomplete  Culture, respiratory (NON-Expectorated)     Status: None   Collection Time: 02/17/16 11:21 AM  Result Value Ref Range Status   Specimen Description TRACHEAL ASPIRATE  Final   Special Requests NONE  Final   Gram Stain   Final    RARE SQUAMOUS EPITHELIAL CELLS PRESENT MODERATE WBC PRESENT,BOTH PMN AND MONONUCLEAR FEW YEAST Performed at [facility], [place] [address] [place], [place] [address]    Culture FEW CANDIDA ALBICANS  Final   Report Status 02/19/2016 FINAL  Final  C difficile quick scan w PCR reflex     Status: None   Collection Time: 02/19/16  6:00 AM  Result Value Ref Range Status   C Diff  antigen NEGATIVE NEGATIVE Final   C Diff toxin NEGATIVE NEGATIVE Final   C Diff interpretation No C. difficile detected.  Final  Culture, blood (Routine X 2) w Reflex to ID Panel     Status: None (Preliminary result)   Collection Time: 02/19/16  8:52 AM  Result Value Ref Range Status   Specimen Description BLOOD BLOOD RIGHT HAND  Final   Special Requests BOTTLES DRAWN AEROBIC AND ANAEROBIC 6CC  Final   Culture NO GROWTH < 24 HOURS  Final   Report Status PENDING  Incomplete  Culture, blood (Routine X 2) w Reflex to ID Panel     Status: None (Preliminary result)   Collection Time: 02/19/16  9:07 AM  Result Value Ref Range Status   Specimen Description BLOOD BLOOD RIGHT HAND  Final   Special Requests BOTTLES DRAWN AEROBIC AND ANAEROBIC 6CC  Final   Culture NO GROWTH < 24 HOURS  Final   Report Status PENDING  Incomplete     Scheduled Meds: . chlorhexidine gluconate (MEDLINE KIT)  15 mL Mouth Rinse BID  . Chlorhexidine Gluconate Cloth  6 each Topical Daily  . famotidine (PEPCID) IV  20 mg Intravenous Q12H  . fentaNYL (SUBLIMAZE) injection  50 mcg Intravenous Once  . furosemide  40 mg Intravenous Q12H  . insulin aspart  0-9 Units Subcutaneous Q4H  . ipratropium-albuterol  3 mL Nebulization Q6H  . mouth rinse  15 mL Mouth Rinse QID  . methylPREDNISolone (SOLU-MEDROL) injection  60 mg Intravenous Q8H  . nicotine  21 mg Transdermal Daily  . polyvinyl alcohol  1 drop Both Eyes Q4H  . potassium chloride  40 mEq Oral Daily  . propofol  20 mg Intravenous Once  . propofol (DIPRIVAN) infusion  5-80 mcg/kg/min Intravenous Once  . sodium chloride flush  10-40 mL Intracatheter Q12H   Continuous Infusions: . feeding supplement (VITAL AF 1.2 CAL) 1,000 mL (02/20/16 1208)  . fentaNYL infusion INTRAVENOUS 100 mcg/hr (02/20/16 1456)  . propofol (DIPRIVAN) infusion 30.059 mcg/kg/min (02/20/16 1455)    Procedures/Studies: Ct Abdomen Pelvis Wo Contrast  Result Date: 02/17/2016 CLINICAL DATA:  Fever  of unknown origin. EXAM: CT CHEST, ABDOMEN AND PELVIS WITHOUT CONTRAST TECHNIQUE: Multidetector CT imaging of the chest, abdomen and pelvis was performed following the standard protocol without IV contrast. COMPARISON:  Chest CT 08/18/2013 FINDINGS: CT CHEST FINDINGS Chest wall: No chest wall mass, supraclavicular or axillary lymphadenopathy. The the thyroid gland is grossly normal. A left-sided PICC line is noted. Cardiovascular: Endotracheal tube and NG tubes are in good position. No complicating features. The heart is normal in size. No pericardial effusion. There is tortuosity, ectasia and calcification of the thoracic aorta. No focal aneurysm. Scattered coronary artery calcifications. Mediastinum/Nodes: No mediastinal or hilar mass or adenopathy. The esophagus is grossly normal. Lungs/Pleura: Biapical pleural and parenchymal scarring changes, left greater than right, stable. Severe emphysematous changes and pulmonary scarring. Stable 6 mm nodular density in the right upper lobe adjacent to an area of pleural thickening. No new worrisome pulmonary lesions or acute pulmonary findings. No pulmonary infiltrate or abscess. Small amount of pleural fluid on the right. Musculoskeletal: No significant bony findings. No evidence of diskitis or osteomyelitis. CT ABDOMEN PELVIS FINDINGS Hepatobiliary: Stable right hepatic lobe cyst. No worrisome hepatic lesions or intrahepatic biliary dilatation. Layering gallstones are noted in the gallbladder. No common bile duct dilatation. Pancreas: No mass, inflammation or ductal dilatation. Spleen: Normal size.  No focal lesions. Adrenals/Urinary Tract: The adrenal glands are unremarkable. Right renal calculus but no obstructing ureteral calculi or bladder calculi. No obvious renal or bladder mass. The bladder is decompressed by Foley catheter. Stomach/Bowel: The stomach, duodenum, small bowel and colon are grossly normal without oral contrast. No inflammatory changes, mass lesions  or obstructive findings. The terminal ileum and appendix are normal. Moderate distention of the colon with air-fluid levels suggesting diarrhea. No colonic wall thickening or low colonic obstruction. Vascular/Lymphatic: Atherosclerotic calcifications involving the aorta iliac arteries. No aneurysm. No mesenteric or retroperitoneal mass or adenopathy. Reproductive: The prostate gland and seminal vesicles are grossly normal. Other: No pelvic mass, adenopathy or free pelvic fluid collections. No findings for abdominal or pelvic abscess. Musculoskeletal: No significant bony findings. Moderate degenerative changes involving the lumbar spine. IMPRESSION: 1. Moderate diffuse colonic distention with air-fluid levels all way to the rectum suggesting diarrhea the but no wall thickening or obstructive  findings. No significant small bowel findings. 2. No significant findings in the chest. No pneumonia or abscess. Stable emphysematous changes and pulmonary scarring. 3. The no intra-abdominal/intrapelvic abscess. 4. Body wall edema suggesting anasarca. 5. Cholelithiasis. 6. Stable hepatic cysts. Electronically Signed   By: Marijo Sanes M.D.   On: 02/17/2016 19:20   Dg Abd 1 View  Result Date: 02/10/2016 CLINICAL DATA:  76 year old male are with abdominal distention and concern for small bowel obstruction. Decreased bowel sounds. EXAM: ABDOMEN - 1 VIEW COMPARISON:  None. FINDINGS: An enteric tube is partially visualized with tip in the proximal stomach. The side port of the enteric tube appears to be at the level of the gastroesophageal junction. There is no bowel dilatation or evidence of obstruction. Air is noted within the stomach and colon. There is thickened appearance of the gastric wall which may be artifactual. Clinical correlation is recommended to evaluate for gastritis. No free air or radiopaque calculi identified. There is degenerative changes of the spine. No acute osseous pathology identified. IMPRESSION: No  bowel dilatation or evidence of obstruction. Enteric tube with tip in the proximal stomach. Artifact versus less likely thickening of the gastric wall. Clinical correlation is recommended to evaluate for gastritis. Electronically Signed   By: Anner Crete M.D.   On: 02/10/2016 23:55   Ct Head Wo Contrast  Result Date: 02/15/2016 CLINICAL DATA:  Unresponsive.  Possible unintentional overdose. EXAM: CT HEAD WITHOUT CONTRAST TECHNIQUE: Contiguous axial images were obtained from the base of the skull through the vertex without intravenous contrast. COMPARISON:  Head CT 04/19/2014 and MRI brain 04/19/2014 FINDINGS: Brain: Stable age related cerebral atrophy, ventriculomegaly and periventricular white matter disease. No extra-axial fluid collections are identified. No CT findings for acute hemispheric infarction or intracranial hemorrhage. No mass lesions. The brainstem and cerebellum are normal. Vascular: Stable vascular calcifications. No hyperdense vessels or obvious aneurysm. Skull: No skull fracture or bone lesions. Sinuses/Orbits: Extensive chronic paranasal sinus disease mainly involving the ethmoid air cells, left maxillary sinus and left half of the sphenoid sinus. The mastoid air cells and middle ear cavities are clear. The globes are intact. Other: No scalp lesions or scalp hematoma. IMPRESSION: 1. Age related cerebral atrophy, ventriculomegaly and periventricular white matter disease, unchanged when compared to prior examination. 2. No acute intracranial findings or mass lesions. 3. Chronic sinus disease. Electronically Signed   By: Marijo Sanes M.D.   On: 02/06/2016 17:00   Ct Chest Wo Contrast  Result Date: 02/17/2016 CLINICAL DATA:  Fever of unknown origin. EXAM: CT CHEST, ABDOMEN AND PELVIS WITHOUT CONTRAST TECHNIQUE: Multidetector CT imaging of the chest, abdomen and pelvis was performed following the standard protocol without IV contrast. COMPARISON:  Chest CT 08/18/2013 FINDINGS: CT CHEST  FINDINGS Chest wall: No chest wall mass, supraclavicular or axillary lymphadenopathy. The the thyroid gland is grossly normal. A left-sided PICC line is noted. Cardiovascular: Endotracheal tube and NG tubes are in good position. No complicating features. The heart is normal in size. No pericardial effusion. There is tortuosity, ectasia and calcification of the thoracic aorta. No focal aneurysm. Scattered coronary artery calcifications. Mediastinum/Nodes: No mediastinal or hilar mass or adenopathy. The esophagus is grossly normal. Lungs/Pleura: Biapical pleural and parenchymal scarring changes, left greater than right, stable. Severe emphysematous changes and pulmonary scarring. Stable 6 mm nodular density in the right upper lobe adjacent to an area of pleural thickening. No new worrisome pulmonary lesions or acute pulmonary findings. No pulmonary infiltrate or abscess. Small amount of pleural fluid on  the right. Musculoskeletal: No significant bony findings. No evidence of diskitis or osteomyelitis. CT ABDOMEN PELVIS FINDINGS Hepatobiliary: Stable right hepatic lobe cyst. No worrisome hepatic lesions or intrahepatic biliary dilatation. Layering gallstones are noted in the gallbladder. No common bile duct dilatation. Pancreas: No mass, inflammation or ductal dilatation. Spleen: Normal size.  No focal lesions. Adrenals/Urinary Tract: The adrenal glands are unremarkable. Right renal calculus but no obstructing ureteral calculi or bladder calculi. No obvious renal or bladder mass. The bladder is decompressed by Foley catheter. Stomach/Bowel: The stomach, duodenum, small bowel and colon are grossly normal without oral contrast. No inflammatory changes, mass lesions or obstructive findings. The terminal ileum and appendix are normal. Moderate distention of the colon with air-fluid levels suggesting diarrhea. No colonic wall thickening or low colonic obstruction. Vascular/Lymphatic: Atherosclerotic calcifications involving  the aorta iliac arteries. No aneurysm. No mesenteric or retroperitoneal mass or adenopathy. Reproductive: The prostate gland and seminal vesicles are grossly normal. Other: No pelvic mass, adenopathy or free pelvic fluid collections. No findings for abdominal or pelvic abscess. Musculoskeletal: No significant bony findings. Moderate degenerative changes involving the lumbar spine. IMPRESSION: 1. Moderate diffuse colonic distention with air-fluid levels all way to the rectum suggesting diarrhea the but no wall thickening or obstructive findings. No significant small bowel findings. 2. No significant findings in the chest. No pneumonia or abscess. Stable emphysematous changes and pulmonary scarring. 3. The no intra-abdominal/intrapelvic abscess. 4. Body wall edema suggesting anasarca. 5. Cholelithiasis. 6. Stable hepatic cysts. Electronically Signed   By: Marijo Sanes M.D.   On: 02/17/2016 19:20   Dg Chest Port 1 View  Result Date: 02/16/2016 CLINICAL DATA:  Respiratory failure. EXAM: PORTABLE CHEST 1 VIEW COMPARISON:  02/13/2016 . FINDINGS: Endotracheal tube and NG tube in stable position. Mild right base infiltrate cannot be excluded. No pleural effusion or pneumothorax. No acute bony abnormality . IMPRESSION: 1.  Lines and tubes in stable position. 2.  Mild right base infiltrate cannot be excluded . Electronically Signed   By: Marcello Moores  Register   On: 02/16/2016 07:32   Dg Chest Port 1 View  Result Date: 02/13/2016 CLINICAL DATA:  Follow-up chest x-ray, ventilated patient. History of COPD, acute on chronic respiratory failure current smoker. EXAM: PORTABLE CHEST 1 VIEW COMPARISON:  Portable chest x-ray of February 12, 2016 FINDINGS: The patient is rotated toward the right. The lungs are hyperinflated. There is no focal infiltrate. There is a trace of blunting of the costophrenic angles. The lung markings are coarse in the right upper lobe. There is no pneumothorax nor pneumomediastinum. The heart and pulmonary  vascularity are normal. The endotracheal tube tip lies 6 cm above the carina. The esophagogastric tube tip projects below the inferior margin of the image. IMPRESSION: COPD, coarse right upper lobe interstitial markings may reflect subsegmental atelectasis or fibrosis. Trace bilateral pleural effusions. No alveolar pneumonia nor pulmonary edema. The support tubes are in reasonable position. Electronically Signed   By: Colletta Spillers  [place] M.D.   On: 02/13/2016 07:33   Dg Chest Port 1 View  Result Date: 02/12/2016 CLINICAL DATA:  Shortness of Breath EXAM: PORTABLE CHEST 1 VIEW COMPARISON:  02/11/2016 FINDINGS: There is hyperinflation of the lungs compatible with COPD. Endotracheal tube and NG tube are unchanged. No confluent opacities or effusions. Heart is normal size. IMPRESSION: COPD.  No acute findings. Electronically Signed   By: Rolm Baptise M.D.   On: 02/12/2016 07:52   Dg Chest Port 1 View  Result Date: 02/11/2016 CLINICAL DATA:  Short of  breath. Ventilated patient. Subsequent encounter. EXAM: PORTABLE CHEST 1 VIEW COMPARISON:  02/10/2016 FINDINGS: Endotracheal tube and nasal/orogastric tube are stable. Lungs are hyperexpanded. There changes of emphysema with decrease markings in the peripheral lungs most evident on the left. No evidence of pneumonia or pulmonary edema. No pleural effusion or pneumothorax. IMPRESSION: 1. No acute findings in the lungs. 2. COPD. 3. Support apparatus is stable and well positioned. Electronically Signed   By: Lajean Manes M.D.   On: 02/11/2016 08:04   Dg Chest Port 1 View  Result Date: 02/10/2016 CLINICAL DATA:  Ventilator dependent EXAM: PORTABLE CHEST 1 VIEW COMPARISON:  02/09/2016 FINDINGS: Endotracheal tube and nasogastric catheter are again seen and stable. Cardiac shadow is within normal limits. Previously seen patchy changes in the right upper lobe have resolved in the interval. No new focal infiltrate is seen. No bony abnormality is noted. IMPRESSION: No acute  abnormality seen. Tubes and lines as described. Electronically Signed   By: Inez Catalina M.D.   On: 02/10/2016 09:01   Dg Chest Port 1 View  Result Date: 02/09/2016 CLINICAL DATA:  Irregular heartbeat.  Chest pain . EXAM: PORTABLE CHEST 1 VIEW COMPARISON:  02/11/2016. FINDINGS: Endotracheal tube noted with tip 3 cm above the carina. NG tube noted with tip below left hemidiaphragm. Mild infiltrate in the right upper lobe. Questionable nodular density is noted in the right apex. Small bilateral pleural effusions. Apical pleural-parenchymal thickening and cystic changes again noted on the right. These changes are consistent with scarring. No pneumothorax. Heart size stable. IMPRESSION: 1.  Endotracheal tube and NG tube in good anatomic position. 2. Questionable nodule right upper lobe. Questionable right upper lobe infiltrate. Follow-up chest x-rays recommended to demonstrate clearing. Electronically Signed   By: Marcello Moores  Register   On: 02/09/2016 08:11   Dg Chest Port 1 View  Result Date: 01/30/2016 CLINICAL DATA:  Respiratory failure EXAM: PORTABLE CHEST 1 VIEW COMPARISON:  10/27/2015 FINDINGS: Endotracheal tube placed. Tip is 4.5 cm from the carina. NG tube tip is beyond the gastroesophageal junction. Lungs are hyperaerated. New nodular density at the right apex measures less than 1 cm. No pneumothorax. Small right pleural effusion. IMPRESSION: Endotracheal and NG tubes placed. Subcentimeter nodule at the right apex has developed. Small right pleural effusion. Electronically Signed   By: Marybelle Killings M.D.   On: 02/01/2016 16:09   US Abdomen Limited Ruq  Result Date: 02/19/2016 CLINICAL DATA:  Elevated LFTs EXAM: US ABDOMEN LIMITED - RIGHT UPPER QUADRANT COMPARISON:  None. FINDINGS: Gallbladder: High echogenicity material seen in the gallbladder with only mild shadowing. Gallbladder wall thickening measures 3.6 mm. The patient is on a ventilator and a Murphy's sign cannot be well assessed. No  pericholecystic fluid. Common bile duct: Diameter: 4.5 mm Liver: 2.7 x 4.2 x 4.1 cm right hepatic lobe cyst. IMPRESSION: 1. Layering material posteriorly in the gallbladder with mild shadowing likely represents sludge and stones with associated wall thickening but no pericholecystic fluid. The common bile duct is normal in caliber. The patient cannot be assessed for Murphy's sign as he is on a ventilator. If there is concern for acute cholecystitis, recommend HIDA scan. Electronically Signed   By: Dorise Bullion III M.D   On: 02/19/2016 11:48    Maxson Oddo, DO  [facility] Pager [phone]  If 7PM-7AM, please contact night-coverage [email or web address] Password [email or web address] 02/20/2016, 5:44 PM   LOS: 13 days

## 2016-02-20 NOTE — Progress Notes (Signed)
Family is requesting the patient be made to remain comfortable as possible. Refusing 8pm  meds.

## 2016-02-20 NOTE — Progress Notes (Signed)
Patient family stated that they are ready for comfort care and extubation. Respiratory notified.

## 2016-02-20 NOTE — Progress Notes (Signed)
Daily Progress Note   Patient Name: [name]       Date: 02/20/2016 DOB: November 14, 1940  Age: 76 y.o. MRN#: [id number] Attending Physician: Orson Eva, MD Primary Care Physician: Vic Blackbird, MD Admit Date: 01/20/2016  Reason for Consultation/Follow-up: Establishing goals of care, Psychosocial/spiritual support, Terminal Care and Withdrawal of life-sustaining treatment  Subjective: [name] is lying quietly in bed. He is intubated/ventilated/sedated. Request to call his wife Tye Maryland. We talk about today's choice of trach versus compassionate extubation. Tye Maryland states that they are not willing to burden [name] with tracheotomy. We discuss family coming to the hospital around 2 PM this afternoon.  Request second call to [facility]. She asks if it would be possible for [place] to allow a daughter, who is now in town, to spend the night with [name] and extubate tomorrow. I encourage Tye Maryland to think about [name] comfort not their daughter. She agrees, plan for compassionate extubation 2/5 at 4 PM.  Call to Dr. Luan Pulling, pulmonary. He agrees with the above plan.  Nursing staff and respiratory therapy aware of planed compassionate extubation. Family meeting outside of ICU with wife Tye Maryland, son Jenny Reichmann, and son J. Family continues to be an agreement for compassionate extubation today. John continues to ask questions about trach and quality of life, but then states wife Tye Maryland is the Media planner, and he will support her decision 100%.  Large family gathering at bedside, some tearful. Family states that [name] eldest sister is driving in from [place] and will be arriving around 5 PM they requested we wait for compassionate extubation. We talk about symptom management and the use of IV  sedatives. I encourage family to lean on nursing and RT staff.         Length of Stay: 13  Current Medications: Scheduled Meds:  . chlorhexidine gluconate (MEDLINE KIT)  15 mL Mouth Rinse BID  . Chlorhexidine Gluconate Cloth  6 each Topical Daily  . enoxaparin (LOVENOX) injection  40 mg Subcutaneous Q24H  . famotidine (PEPCID) IV  20 mg Intravenous Q12H  . fentaNYL (SUBLIMAZE) injection  50 mcg Intravenous Once  . furosemide  40 mg Intravenous Q12H  . insulin aspart  0-9 Units Subcutaneous Q4H  . ipratropium-albuterol  3 mL Nebulization Q6H  . mouth rinse  15 mL Mouth Rinse QID  .  methylPREDNISolone (SOLU-MEDROL) injection  60 mg Intravenous Q8H  . nicotine  21 mg Transdermal Daily  . polyvinyl alcohol  1 drop Both Eyes Q4H  . potassium chloride  40 mEq Oral Daily  . propofol  20 mg Intravenous Once  . propofol (DIPRIVAN) infusion  5-80 mcg/kg/min Intravenous Once  . sodium chloride flush  10-40 mL Intracatheter Q12H    Continuous Infusions: . feeding supplement (VITAL AF 1.2 CAL) 1,000 mL (02/18/16 1636)  . fentaNYL infusion INTRAVENOUS 100 mcg/hr (02/20/16 0559)  . propofol (DIPRIVAN) infusion 30 mcg/kg/min (02/20/16 0606)    PRN Meds: acetaminophen, albuterol, bisacodyl, fentaNYL, sodium chloride, sodium chloride flush  Physical Exam  Constitutional: No distress.  Intubated/sedated  HENT:  Head: Normocephalic and atraumatic.  Cardiovascular: Regular rhythm.   Rate 100s to 110s  Pulmonary/Chest:  Intubated/ventilated, F I 02 30%  Abdominal: Soft. He exhibits no distension.  Musculoskeletal: He exhibits no edema.  Neurological:  Intubated/sedated  Skin: Skin is warm and dry.  Nursing note and vitals reviewed.           Vital Signs: BP (!) 150/78   Pulse (!) 106   Temp (!) 101.1 F (38.4 C)   Resp (!) 21   Ht _0  (1.727 m)   Wt 60.3 kg (132 lb 15 oz)   SpO2 96%   BMI 20.21 kg/m  SpO2: SpO2: 96 % O2 Device: O2 Device: Ventilator O2 Flow Rate: O2 Flow  Rate (L/min): 30 L/min  Intake/output summary:  Intake/Output Summary (Last 24 hours) at 02/20/16 1153 Last data filed at 02/20/16 6295  Gross per 24 hour  Intake           977.05 ml  Output             2850 ml  Net         -1872.95 ml   LBM: Last BM Date: 02/18/16 Baseline Weight: Weight: 59.9 kg (132 lb) Most recent weight: Weight: 60.3 kg (132 lb 15 oz)       Palliative Assessment/Data:    Flowsheet Rows   Flowsheet Row Most Recent Value  Intake Tab  Referral Department  Hospitalist  Unit at Time of Referral  ICU  Palliative Care Primary Diagnosis  Pulmonary  Date Notified  02/09/16  Palliative Care Type  New Palliative care  Reason for referral  Clarify Goals of Care  Date of Admission  02/06/2016  Date first seen by Palliative Care  02/09/16  # of days Palliative referral response time  0 Day(s)  # of days IP prior to Palliative referral  2  Clinical Assessment  Palliative Performance Scale Score  10%  Pain Max last 24 hours  Not able to report  Pain Min Last 24 hours  Not able to report  Dyspnea Max Last 24 Hours  Not able to report  Dyspnea Min Last 24 hours  Not able to report  Psychosocial & Spiritual Assessment  Palliative Care Outcomes  Patient/Family meeting held?  Yes  Who was at the meeting?  Wife, Branko Steeves  Palliative Care Outcomes  Provided psychosocial or spiritual support, Clarified goals of care  Palliative Care follow-up planned  -- [Follow-up while at [facility]      Patient Active Problem List   Diagnosis Date Noted  . Dependence on respirator status ([place])   . Transaminasemia   . Acute on chronic respiratory failure with hypoxia and hypercapnia ([place]) 02/17/2016  . FUO (fever of unknown origin)   . Obstructive chronic bronchitis  with exacerbation ([place])   . Other abnormal blood chemistry   . Fever, unspecified   . Dependence on respirator, status   . Acute respiratory failure with hypoxia and hypercarbia ([place]) 02/15/2016  . Elevated  troponin 02/15/2016  . Ventilator dependence ([place])   . Palliative care encounter   . Goals of care, counseling/discussion   . DNR (do not resuscitate) discussion   . Respiratory failure ([place]) 02/12/2016  . Acute on chronic respiratory failure with hypercapnia ([place]) 02/06/2016  . Respiratory disorder with ventilator dependence ([place]) 01/30/2016  . Fever 01/17/2016  . Acute bronchitis 01/25/2016  . 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 Tremor 07/17/2015  . GERD (gastroesophageal reflux disease) 03/21/2015  . Generalized weakness 04/20/2014  . Gait instability 04/20/2014  . Acute sinusitis 04/20/2014  . COPD exacerbation ([place]) 03/31/2014  . Chronic respiratory failure with hypoxia ([place]) 08/19/2013  . AK (actinic keratosis) 07/29/2013  . Atypical chest pain 07/29/2013  . Decreased hearing 11/03/2012  . Leg cramps 07/04/2012  . OA (osteoarthritis) 04/25/2012  . Impaired mobility 04/25/2012  . Insomnia 03/29/2012  . Tobacco abuse 04/05/2011  . COPD (chronic obstructive pulmonary disease) ([place]) 03/14/2011  . Essential hypertension, benign 03/14/2011  . Hyperlipidemia 03/14/2011  . PVD (peripheral vascular disease) ([place]) 03/14/2011  . Depression with anxiety 03/14/2011  . Joint pain 03/14/2011  . Skin cancer 03/14/2011    Palliative Care Assessment & Plan   Patient Profile: 76 y.o.malewith past medical history of COPD per wife intubated/ventilated 5 times, hypertension and high cholesterol, PVD, anxiety, arthritis, skin canceradmitted on 1/23/2018with acute on chronic ventilator dependent respiratory failure due to COPD exacerbation. He has been unable to wean over the last few days. He is only weaning for a few hours one day.  He has been unable to wean for the last several days.   Assessment: Acute on chronic hypoxemic respiratory failure, ventilatory dependent; Ventilator weaning today for 2 hours, continue ventilator support. Family meeting today to discuss  possible outcomes. At this point, continue full support, weaning efforts. Wife Norm Salt for compassionate extubation 2/5 at 4 PM. Dr. Luan Pulling aware and in agreement. Hospitalist Dr. Carles Collet also aware and in agreement. Functional decline; wife States that Mr. Rosebrook has had functional decline over the last few months, his goal was to get out and exercise more, but he has not been able. She tells a story of him being unable to lift a hammer to complete a project.  Mr. Garris is dependent on her for help with bathing. Son Lavella Hammock had stated that he has seen a decline in his father over the last year.    Recommendations/Plan:  compassionate extubation today at 4 PM. It's unlikely Mr. Bidinger will last more than a few hours. Anticipated Hospital death.  Goals of Care and Additional Recommendations:  Limitations on Scope of Treatment: Full Comfort Care after extubation.   Code Status:    Code Status Orders        Start     Ordered   02/15/16 1604  Limited resuscitation (code)  Continuous    Question Answer Comment  In the event of cardiac or respiratory ARREST: Initiate Code Blue, Call Rapid Response No   In the event of cardiac or respiratory ARREST: Perform CPR No   In the event of cardiac or respiratory ARREST: Perform Intubation/Mechanical Ventilation Yes   In the event of cardiac or respiratory ARREST: Use NIPPV/BiPAp only if indicated Yes   In the event of cardiac or  respiratory ARREST: Administer ACLS medications if indicated Yes   In the event of cardiac or respiratory ARREST: Perform Defibrillation or Cardioversion if indicated No      02/15/16 1604    Code Status History    Date Active Date Inactive Code Status Order ID Comments User Context   01/20/2016  9:00 PM 02/15/2016  4:04 PM Full Code [id number]  Roney Jaffe, MD Inpatient   08/18/2013  3:55 PM 08/20/2013  7:03 PM Full Code [id number]  Kinnie Feil, MD Inpatient   04/05/2011  3:46 AM 04/07/2011  7:59 PM Full Code [id number]   Ardath Sax, RN Inpatient       Prognosis:   Hours - Days, after extubation. Likely hours.   Discharge Planning:  Anticipated Hospital Death  Care plan was discussed with nursing staff, case manager, social worker, Dr. Luan Pulling and Dr. Carles Collet.   Thank you for allowing the Palliative Medicine Team to assist in the care of this patient.   Time In: 1000 1430 Time Out: 1020 1510 Total Time 60 minutes Prolonged Time Billed  yes       Greater than 50%  of this time was spent counseling and coordinating care related to the above assessment and plan.  Drue Novel, NP  Please contact Palliative Medicine Team phone at [phone] for questions and concerns.

## 2016-02-20 NOTE — Discharge Summary (Signed)
Death Summary  [name] [id number] DOB: Aug 14, 1940 DOA: 02-23-2016  PCP: Vic Blackbird, MD  Admit date: 02-23-2016 Date of Death:03/08/16 Time of Death: 0100 Notification: Vic Blackbird, MD notified of death   History of present illness:  76 year old man admitted to the hospital on 2022-02-22 due to shortness of breath, was intubated in the emergency department. Continued difficulty with resp acidosis on vent. Abdominal distention but Xray without obstruction. Tube feeds have been held. 1/28: ABG improved. With significant increased volume and changes on CXR that may represent volume overload. Weaning attempts have been unsuccessful initially due to respiratory acidosis and now due to tachycardia every time sedation is decreased. Palliative care and rounding physicians have had multiple family discussions with some family members seem to be unrealistic with expectations. Patient has very poor prognosis. Pulmonary medicine continues to follow.  Interim Events -Overnight 02/18/16, the patient had hypotension. His propofol and fentanyl were discontinued for at least 1 hour. As a result, patient became more tachycardic and tachypneic. The fentanyl was restarted, but pt continues to be tachypneic-->restart profol with improvement in RR and BP.  The patient continued to fail weaning attempts. Conversations were held with the patient's wife and family on daily basis. The patient continued to spike fevers on a daily basis without a clear source. Palliative medicine continued to follow. Ultimately, the patient's wife who is the primary decision maker felt to transition to comfort care approach was most reasonable.She did not wish to pursue tracheostomy and  Gtube placement. Therefore, after discussion with pulmonary medicine, terminal extrubation was performed on 02/20/2016. 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 Luan Pulling input and recommendations-->if unable to wean by 02/20/16-->will need trach/PEG vs terminal extubation pending family meeting on 02/20/16 -has failed multipleweaning attempts -appreciate Palliative medicine continued discussions - patient's wife who is the primary decision maker felt to transition to comfort care approach was most reasonable.   New Fever -2/1 UA--no pyuria -2/1 blood cultures x 2 sets--neg to date -tracheal aspirate/culture--pending -procalcitonin <0.10 -personally reviewed Chest x-ray--negative for infiltrates; shows chronic interstitial prominence -d/c antimicrobials and observe clinically -2/2--CT chest--no infiltrates. Stable right upper lobe 6 cm nodular density. Emphysema with pulmonary scarring -2/2--CT- abdomen/pelvis--moderately distended colon with air-fluid levels; no colonic wall thickening. No hydronephrosis or abscess. -Continue to observe off antibiotics -with mild transaminasemia and cholelithiasis-->RUQ ultrasound--> cholelithiasis with sludge and wall thickening without pericholecystic fluid. -Further workup was not undertaken regarding the patient's gallbladder as the patient's focus of care was to transition to focus on his comfort. -2/4--WBC climbing--check cdiff--neg, repeat blood cultures-neg to date  COPD Exacerbation -continue solumedrol -continue DuoNebs -appreciate Dr. Kathaleen Grinder followup  HCAP -check procalcitonin < 0.10 -finished 8 days vanc (2/1) /zosyn (2/2) -d/c antimicrobials and observe  Elevated troponins -Due to demand ischemia -02/10/2016 echo EF 55-60%, grade 1 daily, technically difficult -No plans for aggressive cardiac workup  Paroxsymal Afib--new onset -secondary to infection and respiratory failure/COPD -presently in  sinus  Lower extremity edema -check albumin--2.8 -urine protein/creatinine ratio--unable to calculate as urine protein <6 -increasedlasix per Dr. Luan Pulling -daily weights--NEG 10lbs since increased lasix; NEG 9L in past 72hours  FEN -agree with restart enteral feeds-->tolerating @50cc /hr -hypernatremia due to furosemide -Hypokalemia-->replete -Magnesium 2.2 -diuresis per Dr. Luan Pulling  Hyperglycemia -likely due to steroids -start SSI  Goals of Care -Appreciate palliative medicine follow-up--discussed with Ms. Hulan Fray 02/17/16 -Patient is now no defibrillation, no chest compressions -complex family dynamics - patient's wife who is the primary decision maker felt to transition to comfort care approach was most reasonable. She did not wish to pursue tracheostomy and  Gtube placement.Therefore, after discussion with pulmonary medicine, terminal extrubation was performed on 12/19/2016.   The results of significant diagnostics from this hospitalization (including imaging, microbiology, ancillary and laboratory) are listed below for reference.    Significant Diagnostic Studies: Ct Abdomen Pelvis Wo Contrast  Result Date: 02/17/2016 CLINICAL DATA:  Fever of unknown origin. EXAM: CT CHEST, ABDOMEN AND PELVIS WITHOUT CONTRAST TECHNIQUE: Multidetector CT imaging of the chest, abdomen and pelvis was performed following the standard protocol without IV contrast. COMPARISON:  Chest CT 08/18/2013 FINDINGS: CT CHEST FINDINGS Chest wall: No chest wall mass, supraclavicular or axillary lymphadenopathy. The the thyroid gland is grossly normal. A left-sided PICC line is noted. Cardiovascular: Endotracheal tube and NG tubes are in good position. No complicating features. The heart is normal in size. No pericardial effusion. There is tortuosity, ectasia and calcification of the thoracic aorta. No focal aneurysm. Scattered coronary artery calcifications. Mediastinum/Nodes: No mediastinal or hilar mass or  adenopathy. The esophagus is grossly normal. Lungs/Pleura: Biapical pleural and parenchymal scarring changes, left greater than right, stable. Severe emphysematous changes and pulmonary scarring. Stable 6 mm nodular density in the right upper lobe adjacent to an area of pleural thickening. No new worrisome pulmonary lesions or acute pulmonary findings. No pulmonary infiltrate or abscess. Small amount of pleural fluid on the right. Musculoskeletal: No significant bony findings. No evidence of diskitis or osteomyelitis. CT ABDOMEN PELVIS FINDINGS Hepatobiliary: Stable right hepatic lobe cyst. No worrisome hepatic lesions or intrahepatic biliary dilatation. Layering gallstones are noted in the gallbladder. No common bile duct dilatation. Pancreas: No mass, inflammation or ductal dilatation. Spleen: Normal size.  No focal lesions. Adrenals/Urinary Tract: The adrenal glands are unremarkable. Right renal calculus but no obstructing ureteral calculi or bladder calculi. No obvious renal or bladder mass. The bladder is decompressed by Foley catheter. Stomach/Bowel: The stomach, duodenum, small bowel and colon are grossly normal without oral contrast. No inflammatory changes, mass lesions or obstructive findings. The terminal ileum and appendix are normal. Moderate distention of the colon with air-fluid levels suggesting diarrhea. No colonic wall thickening or low colonic obstruction. Vascular/Lymphatic: Atherosclerotic calcifications involving the aorta iliac arteries. No aneurysm. No mesenteric or retroperitoneal mass or adenopathy. Reproductive: The prostate gland and seminal vesicles are grossly normal. Other: No pelvic mass, adenopathy or free pelvic fluid collections. No findings for abdominal or pelvic abscess. Musculoskeletal: No significant bony findings. Moderate degenerative changes involving the lumbar spine. IMPRESSION: 1. Moderate diffuse colonic distention with air-fluid levels all way to the rectum suggesting  diarrhea the but no wall thickening or obstructive findings. No significant small bowel findings. 2. No significant findings in the chest. No pneumonia or abscess. Stable emphysematous changes and pulmonary scarring. 3. The no intra-abdominal/intrapelvic abscess. 4. Body wall edema suggesting anasarca. 5. Cholelithiasis. 6. Stable hepatic cysts. Electronically Signed   By: Marijo Sanes M.D.   On: 02/17/2016 19:20   Dg Abd 1 View  Result Date: 02/10/2016 CLINICAL DATA:  76 year old male are with abdominal distention and concern for small bowel obstruction. Decreased bowel sounds. EXAM: ABDOMEN - 1 VIEW COMPARISON:  None. FINDINGS: An enteric tube is partially visualized with tip in the proximal stomach. The side port of the enteric tube appears to  be at the level of the gastroesophageal junction. There is no bowel dilatation or evidence of obstruction. Air is noted within the stomach and colon. There is thickened appearance of the gastric wall which may be artifactual. Clinical correlation is recommended to evaluate for gastritis. No free air or radiopaque calculi identified. There is degenerative changes of the spine. No acute osseous pathology identified. IMPRESSION: No bowel dilatation or evidence of obstruction. Enteric tube with tip in the proximal stomach. Artifact versus less likely thickening of the gastric wall. Clinical correlation is recommended to evaluate for gastritis. Electronically Signed   By: Anner Crete M.D.   On: 02/10/2016 23:55   Ct Head Wo Contrast  Result Date: 02/14/2016 CLINICAL DATA:  Unresponsive.  Possible unintentional overdose. EXAM: CT HEAD WITHOUT CONTRAST TECHNIQUE: Contiguous axial images were obtained from the base of the skull through the vertex without intravenous contrast. COMPARISON:  Head CT 04/19/2014 and MRI brain 04/19/2014 FINDINGS: Brain: Stable age related cerebral atrophy, ventriculomegaly and periventricular white matter disease. No extra-axial fluid  collections are identified. No CT findings for acute hemispheric infarction or intracranial hemorrhage. No mass lesions. The brainstem and cerebellum are normal. Vascular: Stable vascular calcifications. No hyperdense vessels or obvious aneurysm. Skull: No skull fracture or bone lesions. Sinuses/Orbits: Extensive chronic paranasal sinus disease mainly involving the ethmoid air cells, left maxillary sinus and left half of the sphenoid sinus. The mastoid air cells and middle ear cavities are clear. The globes are intact. Other: No scalp lesions or scalp hematoma. IMPRESSION: 1. Age related cerebral atrophy, ventriculomegaly and periventricular white matter disease, unchanged when compared to prior examination. 2. No acute intracranial findings or mass lesions. 3. Chronic sinus disease. Electronically Signed   By: Marijo Sanes M.D.   On: 02/14/2016 17:00   Ct Chest Wo Contrast  Result Date: 02/17/2016 CLINICAL DATA:  Fever of unknown origin. EXAM: CT CHEST, ABDOMEN AND PELVIS WITHOUT CONTRAST TECHNIQUE: Multidetector CT imaging of the chest, abdomen and pelvis was performed following the standard protocol without IV contrast. COMPARISON:  Chest CT 08/18/2013 FINDINGS: CT CHEST FINDINGS Chest wall: No chest wall mass, supraclavicular or axillary lymphadenopathy. The the thyroid gland is grossly normal. A left-sided PICC line is noted. Cardiovascular: Endotracheal tube and NG tubes are in good position. No complicating features. The heart is normal in size. No pericardial effusion. There is tortuosity, ectasia and calcification of the thoracic aorta. No focal aneurysm. Scattered coronary artery calcifications. Mediastinum/Nodes: No mediastinal or hilar mass or adenopathy. The esophagus is grossly normal. Lungs/Pleura: Biapical pleural and parenchymal scarring changes, left greater than right, stable. Severe emphysematous changes and pulmonary scarring. Stable 6 mm nodular density in the right upper lobe adjacent to  an area of pleural thickening. No new worrisome pulmonary lesions or acute pulmonary findings. No pulmonary infiltrate or abscess. Small amount of pleural fluid on the right. Musculoskeletal: No significant bony findings. No evidence of diskitis or osteomyelitis. CT ABDOMEN PELVIS FINDINGS Hepatobiliary: Stable right hepatic lobe cyst. No worrisome hepatic lesions or intrahepatic biliary dilatation. Layering gallstones are noted in the gallbladder. No common bile duct dilatation. Pancreas: No mass, inflammation or ductal dilatation. Spleen: Normal size.  No focal lesions. Adrenals/Urinary Tract: The adrenal glands are unremarkable. Right renal calculus but no obstructing ureteral calculi or bladder calculi. No obvious renal or bladder mass. The bladder is decompressed by Foley catheter. Stomach/Bowel: The stomach, duodenum, small bowel and colon are grossly normal without oral contrast. No inflammatory changes, mass lesions or obstructive findings. The terminal  ileum and appendix are normal. Moderate distention of the colon with air-fluid levels suggesting diarrhea. No colonic wall thickening or low colonic obstruction. Vascular/Lymphatic: Atherosclerotic calcifications involving the aorta iliac arteries. No aneurysm. No mesenteric or retroperitoneal mass or adenopathy. Reproductive: The prostate gland and seminal vesicles are grossly normal. Other: No pelvic mass, adenopathy or free pelvic fluid collections. No findings for abdominal or pelvic abscess. Musculoskeletal: No significant bony findings. Moderate degenerative changes involving the lumbar spine. IMPRESSION: 1. Moderate diffuse colonic distention with air-fluid levels all way to the rectum suggesting diarrhea the but no wall thickening or obstructive findings. No significant small bowel findings. 2. No significant findings in the chest. No pneumonia or abscess. Stable emphysematous changes and pulmonary scarring. 3. The no intra-abdominal/intrapelvic  abscess. 4. Body wall edema suggesting anasarca. 5. Cholelithiasis. 6. Stable hepatic cysts. Electronically Signed   By: Marijo Sanes M.D.   On: 02/17/2016 19:20   Dg Chest Port 1 View  Result Date: 02/16/2016 CLINICAL DATA:  Respiratory failure. EXAM: PORTABLE CHEST 1 VIEW COMPARISON:  02/13/2016 . FINDINGS: Endotracheal tube and NG tube in stable position. Mild right base infiltrate cannot be excluded. No pleural effusion or pneumothorax. No acute bony abnormality . IMPRESSION: 1.  Lines and tubes in stable position. 2.  Mild right base infiltrate cannot be excluded . Electronically Signed   By: Marcello Moores  Register   On: 02/16/2016 07:32   Dg Chest Port 1 View  Result Date: 02/13/2016 CLINICAL DATA:  Follow-up chest x-ray, ventilated patient. History of COPD, acute on chronic respiratory failure current smoker. EXAM: PORTABLE CHEST 1 VIEW COMPARISON:  Portable chest x-ray of February 12, 2016 FINDINGS: The patient is rotated toward the right. The lungs are hyperinflated. There is no focal infiltrate. There is a trace of blunting of the costophrenic angles. The lung markings are coarse in the right upper lobe. There is no pneumothorax nor pneumomediastinum. The heart and pulmonary vascularity are normal. The endotracheal tube tip lies 6 cm above the carina. The esophagogastric tube tip projects below the inferior margin of the image. IMPRESSION: COPD, coarse right upper lobe interstitial markings may reflect subsegmental atelectasis or fibrosis. Trace bilateral pleural effusions. No alveolar pneumonia nor pulmonary edema. The support tubes are in reasonable position. Electronically Signed   By: Allicia Culley  [place] M.D.   On: 02/13/2016 07:33   Dg Chest Port 1 View  Result Date: 02/12/2016 CLINICAL DATA:  Shortness of Breath EXAM: PORTABLE CHEST 1 VIEW COMPARISON:  02/11/2016 FINDINGS: There is hyperinflation of the lungs compatible with COPD. Endotracheal tube and NG tube are unchanged. No confluent opacities or  effusions. Heart is normal size. IMPRESSION: COPD.  No acute findings. Electronically Signed   By: Rolm Baptise M.D.   On: 02/12/2016 07:52   Dg Chest Port 1 View  Result Date: 02/11/2016 CLINICAL DATA:  Short of breath. Ventilated patient. Subsequent encounter. EXAM: PORTABLE CHEST 1 VIEW COMPARISON:  02/10/2016 FINDINGS: Endotracheal tube and nasal/orogastric tube are stable. Lungs are hyperexpanded. There changes of emphysema with decrease markings in the peripheral lungs most evident on the left. No evidence of pneumonia or pulmonary edema. No pleural effusion or pneumothorax. IMPRESSION: 1. No acute findings in the lungs. 2. COPD. 3. Support apparatus is stable and well positioned. Electronically Signed   By: Lajean Manes M.D.   On: 02/11/2016 08:04   Dg Chest Port 1 View  Result Date: 02/10/2016 CLINICAL DATA:  Ventilator dependent EXAM: PORTABLE CHEST 1 VIEW COMPARISON:  02/09/2016 FINDINGS: Endotracheal tube and  nasogastric catheter are again seen and stable. Cardiac shadow is within normal limits. Previously seen patchy changes in the right upper lobe have resolved in the interval. No new focal infiltrate is seen. No bony abnormality is noted. IMPRESSION: No acute abnormality seen. Tubes and lines as described. Electronically Signed   By: Inez Catalina M.D.   On: 02/10/2016 09:01   Dg Chest Port 1 View  Result Date: 02/09/2016 CLINICAL DATA:  Irregular heartbeat.  Chest pain . EXAM: PORTABLE CHEST 1 VIEW COMPARISON:  02/03/2016. FINDINGS: Endotracheal tube noted with tip 3 cm above the carina. NG tube noted with tip below left hemidiaphragm. Mild infiltrate in the right upper lobe. Questionable nodular density is noted in the right apex. Small bilateral pleural effusions. Apical pleural-parenchymal thickening and cystic changes again noted on the right. These changes are consistent with scarring. No pneumothorax. Heart size stable. IMPRESSION: 1.  Endotracheal tube and NG tube in good anatomic  position. 2. Questionable nodule right upper lobe. Questionable right upper lobe infiltrate. Follow-up chest x-rays recommended to demonstrate clearing. Electronically Signed   By: Marcello Moores  Register   On: 02/09/2016 08:11   Dg Chest Port 1 View  Result Date: 02/13/2016 CLINICAL DATA:  Respiratory failure EXAM: PORTABLE CHEST 1 VIEW COMPARISON:  10/27/2015 FINDINGS: Endotracheal tube placed. Tip is 4.5 cm from the carina. NG tube tip is beyond the gastroesophageal junction. Lungs are hyperaerated. New nodular density at the right apex measures less than 1 cm. No pneumothorax. Small right pleural effusion. IMPRESSION: Endotracheal and NG tubes placed. Subcentimeter nodule at the right apex has developed. Small right pleural effusion. Electronically Signed   By: Marybelle Killings M.D.   On: 01/30/2016 16:09   US Abdomen Limited Ruq  Result Date: 02/19/2016 CLINICAL DATA:  Elevated LFTs EXAM: US ABDOMEN LIMITED - RIGHT UPPER QUADRANT COMPARISON:  None. FINDINGS: Gallbladder: High echogenicity material seen in the gallbladder with only mild shadowing. Gallbladder wall thickening measures 3.6 mm. The patient is on a ventilator and a Murphy's sign cannot be well assessed. No pericholecystic fluid. Common bile duct: Diameter: 4.5 mm Liver: 2.7 x 4.2 x 4.1 cm right hepatic lobe cyst. IMPRESSION: 1. Layering material posteriorly in the gallbladder with mild shadowing likely represents sludge and stones with associated wall thickening but no pericholecystic fluid. The common bile duct is normal in caliber. The patient cannot be assessed for Murphy's sign as he is on a ventilator. If there is concern for acute cholecystitis, recommend HIDA scan. Electronically Signed   By: Dorise Bullion III M.D   On: 02/19/2016 11:48    Microbiology: Recent Results (from the past 240 hour(s))  Urine culture     Status: None   Collection Time: 02/16/16  7:39 AM  Result Value Ref Range Status   Specimen Description URINE, CLEAN CATCH   Final   Special Requests NONE  Final   Culture   Final    NO GROWTH Performed at [facility], [address] [place], [place] [address]    Report Status 02/17/2016 FINAL  Final  Culture, blood (Routine X 2) w Reflex to ID Panel     Status: None (Preliminary result)   Collection Time: 02/16/16  7:56 AM  Result Value Ref Range Status   Specimen Description BLOOD RIGHT HAND  Final   Special Requests BOTTLES DRAWN AEROBIC AND ANAEROBIC 6CC  Final   Culture NO GROWTH 4 DAYS  Final   Report Status PENDING  Incomplete  Culture, blood (Routine X  2) w Reflex to ID Panel     Status: None (Preliminary result)   Collection Time: 02/16/16 10:01 AM  Result Value Ref Range Status   Specimen Description BLOOD RIGHT HAND  Final   Special Requests BOTTLES DRAWN AEROBIC ONLY 6CC  Final   Culture NO GROWTH 4 DAYS  Final   Report Status PENDING  Incomplete  Culture, respiratory (NON-Expectorated)     Status: None   Collection Time: 02/17/16 11:21 AM  Result Value Ref Range Status   Specimen Description TRACHEAL ASPIRATE  Final   Special Requests NONE  Final   Gram Stain   Final    RARE SQUAMOUS EPITHELIAL CELLS PRESENT MODERATE WBC PRESENT,BOTH PMN AND MONONUCLEAR FEW YEAST Performed at [facility], [place] [address] [place], [place] [address]    Culture FEW CANDIDA ALBICANS  Final   Report Status 02/19/2016 FINAL  Final  C difficile quick scan w PCR reflex     Status: None   Collection Time: 02/19/16  6:00 AM  Result Value Ref Range Status   C Diff antigen NEGATIVE NEGATIVE Final   C Diff toxin NEGATIVE NEGATIVE Final   C Diff interpretation No C. difficile detected.  Final  Culture, blood (Routine X 2) w Reflex to ID Panel     Status: None (Preliminary result)   Collection Time: 02/19/16  8:52 AM  Result Value Ref Range Status   Specimen Description BLOOD BLOOD RIGHT HAND  Final   Special Requests BOTTLES DRAWN AEROBIC AND ANAEROBIC 6CC  Final   Culture NO GROWTH < 24 HOURS   Final   Report Status PENDING  Incomplete  Culture, blood (Routine X 2) w Reflex to ID Panel     Status: None (Preliminary result)   Collection Time: 02/19/16  9:07 AM  Result Value Ref Range Status   Specimen Description BLOOD BLOOD RIGHT HAND  Final   Special Requests BOTTLES DRAWN AEROBIC AND ANAEROBIC 6CC  Final   Culture NO GROWTH < 24 HOURS  Final   Report Status PENDING  Incomplete     Labs: Basic Metabolic Panel:  Recent Labs Lab 02/16/16 0421 02/17/16 0416 02/18/16 0455 02/19/16 0500 02/20/16 0429  NA 153* 152* 153* 152* 153*  K 3.0* 3.3* 3.4* 4.0 4.0  CL 114* 112* 110 108 110  CO2 30 30 33* 32 36*  GLUCOSE 175* 194* 228* 204* 209*  BUN 49* 60* 63* 67* 76*  CREATININE 1.42* 1.56* 1.43* 1.31* 1.46*  CALCIUM 7.6* 7.5* 7.1* 7.5* 7.3*  MG 2.2  --   --   --   --    Liver Function Tests:  Recent Labs Lab 02/15/16 0417 02/18/16 0455 02/19/16 0500  AST 30 32 29  ALT 51 83* 70*  ALKPHOS 37* 43 56  BILITOT 1.0 0.8 0.8  PROT 5.1* 5.0* 5.5*  ALBUMIN 2.8* 2.6* 2.8*   No results for input(s): LIPASE, AMYLASE in the last 168 hours. No results for input(s): AMMONIA in the last 168 hours. CBC:  Recent Labs Lab 02/15/16 0417 02/18/16 0455 02/19/16 0500 02/20/16 0429  WBC 6.1 12.3* 23.6* 15.6*  NEUTROABS  --  11.3*  --   --   HGB 13.8 13.6 15.6 13.8  HCT 41.9 43.2 49.2 43.9  MCV 95.7 98.9 99.4 100.0  PLT 159 109* 144* 102*   Cardiac Enzymes: No results for input(s): CKTOTAL, CKMB, CKMBINDEX, TROPONINI in the last 168 hours. D-Dimer No results for input(s): DDIMER in the last 72 hours. BNP: Invalid  input(s): POCBNP CBG:  Recent Labs Lab 02/19/16 1935 02/20/16 0001 02/20/16 0428 02/20/16 0717 02/20/16 1048  GLUCAP 172* 235* 208* 178* 201*   Anemia work up No results for input(s): VITAMINB12, FOLATE, FERRITIN, TIBC, IRON, RETICCTPCT in the last 72 hours. Urinalysis    Component Value Date/Time   COLORURINE YELLOW 02/16/2016 0739   APPEARANCEUR  CLEAR 02/16/2016 0739   LABSPEC 1.015 02/16/2016 0739   PHURINE 5.0 02/16/2016 0739   GLUCOSEU NEGATIVE 02/16/2016 0739   HGBUR SMALL (A) 02/16/2016 0739   BILIRUBINUR NEGATIVE 02/16/2016 0739   KETONESUR NEGATIVE 02/16/2016 0739   PROTEINUR NEGATIVE 02/16/2016 0739   UROBILINOGEN 0.2 04/19/2014 1946   NITRITE NEGATIVE 02/16/2016 0739   LEUKOCYTESUR NEGATIVE 02/16/2016 0739   Sepsis Labs Invalid input(s): PROCALCITONIN,  WBC,  LACTICIDVEN Microbiology Recent Results (from the past 240 hour(s))  Urine culture     Status: None   Collection Time: 02/16/16  7:39 AM  Result Value Ref Range Status   Specimen Description URINE, CLEAN CATCH  Final   Special Requests NONE  Final   Culture   Final    NO GROWTH Performed at [facility], [facility] [address], [place], [place] [address]    Report Status 02/17/2016 FINAL  Final  Culture, blood (Routine X 2) w Reflex to ID Panel     Status: None (Preliminary result)   Collection Time: 02/16/16  7:56 AM  Result Value Ref Range Status   Specimen Description BLOOD RIGHT HAND  Final   Special Requests BOTTLES DRAWN AEROBIC AND ANAEROBIC 6CC  Final   Culture NO GROWTH 4 DAYS  Final   Report Status PENDING  Incomplete  Culture, blood (Routine X 2) w Reflex to ID Panel     Status: None (Preliminary result)   Collection Time: 02/16/16 10:01 AM  Result Value Ref Range Status   Specimen Description BLOOD RIGHT HAND  Final   Special Requests BOTTLES DRAWN AEROBIC ONLY 6CC  Final   Culture NO GROWTH 4 DAYS  Final   Report Status PENDING  Incomplete  Culture, respiratory (NON-Expectorated)     Status: None   Collection Time: 02/17/16 11:21 AM  Result Value Ref Range Status   Specimen Description TRACHEAL ASPIRATE  Final   Special Requests NONE  Final   Gram Stain   Final    RARE SQUAMOUS EPITHELIAL CELLS PRESENT MODERATE WBC PRESENT,BOTH PMN AND MONONUCLEAR FEW YEAST Performed at [facility], [place] [address] [place], [place] [address]     Culture FEW CANDIDA ALBICANS  Final   Report Status 02/19/2016 FINAL  Final  C difficile quick scan w PCR reflex     Status: None   Collection Time: 02/19/16  6:00 AM  Result Value Ref Range Status   C Diff antigen NEGATIVE NEGATIVE Final   C Diff toxin NEGATIVE NEGATIVE Final   C Diff interpretation No C. difficile detected.  Final  Culture, blood (Routine X 2) w Reflex to ID Panel     Status: None (Preliminary result)   Collection Time: 02/19/16  8:52 AM  Result Value Ref Range Status   Specimen Description BLOOD BLOOD RIGHT HAND  Final   Special Requests BOTTLES DRAWN AEROBIC AND ANAEROBIC 6CC  Final   Culture NO GROWTH < 24 HOURS  Final   Report Status PENDING  Incomplete  Culture, blood (Routine X 2) w Reflex to ID Panel     Status: None (Preliminary result)   Collection Time: 02/19/16  9:07 AM  Result Value Ref Range Status   Specimen Description BLOOD BLOOD RIGHT HAND  Final   Special Requests BOTTLES DRAWN AEROBIC AND ANAEROBIC [place]  Final   Culture NO GROWTH < 24 HOURS  Final   Report Status PENDING  Incomplete     Signed:  Geraldo Docker [facility] [phone] 02/20/2016, 4:49 PM

## 2016-02-20 NOTE — Progress Notes (Signed)
Subjective: He failed weaning attempt yesterday. No new changes noted except for some diarrhea from tube feeding.  Objective: Vital signs in last 24 hours: Temp:  [100.3 F (37.9 C)-101.1 F (38.4 C)] 101.1 F (38.4 C) (02/05 0722) Pulse Rate:  [106] 106 (02/04 2102) Resp:  [17-22] 20 (02/05 0722) BP: (95-169)/(55-127) 150/78 (02/05 0645) SpO2:  [97 %-98 %] 97 % (02/05 0600) FiO2 (%):  [30 %] 30 % (02/05 0443) Weight:  [60.3 kg (132 lb 15 oz)] 60.3 kg (132 lb 15 oz) (02/05 0600) Weight change: -2.9 kg (-6 lb 6.3 oz) Last BM Date: 02/18/16  Intake/Output from previous day: 02/04 0701 - 02/05 0700 In: 977.1 [I.V.:397.1; NG/GT:530; IV Piggyback:50] Out: 2850 [Urine:2850]  PHYSICAL EXAM General appearance: Intubated sedated on the ventilator, chronically sick in appearance Resp: rhonchi bilaterally Cardio: Regular tachycardia with occasional PAC and PVC GI: soft, non-tender; bowel sounds normal; no masses,  no organomegaly Extremities: Still 1+ edema of his legs Skin warm and dry. Mucous membranes slightly dry  Lab Results:  Results for orders placed or performed during the hospital encounter of 01/27/2016 (from the past 48 hour(s))  Glucose, capillary     Status: Abnormal   Collection Time: 02/18/16 11:02 AM  Result Value Ref Range   Glucose-Capillary 204 (H) 65 - 99 mg/dL  Glucose, capillary     Status: Abnormal   Collection Time: 02/18/16  4:03 PM  Result Value Ref Range   Glucose-Capillary 202 (H) 65 - 99 mg/dL  Glucose, capillary     Status: Abnormal   Collection Time: 02/18/16  7:30 PM  Result Value Ref Range   Glucose-Capillary 176 (H) 65 - 99 mg/dL   Comment 1 Notify RN   Glucose, capillary     Status: Abnormal   Collection Time: 02/18/16 11:54 PM  Result Value Ref Range   Glucose-Capillary 212 (H) 65 - 99 mg/dL   Comment 1 Notify RN   Glucose, capillary     Status: Abnormal   Collection Time: 02/19/16  3:49 AM  Result Value Ref Range   Glucose-Capillary 175  (H) 65 - 99 mg/dL   Comment 1 Notify RN   Comprehensive metabolic panel     Status: Abnormal   Collection Time: 02/19/16  5:00 AM  Result Value Ref Range   Sodium 152 (H) 135 - 145 mmol/[name]   Potassium 4.0 3.5 - 5.1 mmol/[name]   Chloride 108 101 - 111 mmol/[name]   CO2 32 22 - 32 mmol/[name]   Glucose, Bld 204 (H) 65 - 99 mg/dL   BUN 67 (H) 6 - 20 mg/dL   Creatinine, Ser 1.31 (H) 0.61 - 1.24 mg/dL   Calcium 7.5 ([name]) 8.9 - 10.3 mg/dL   Total Protein 5.5 ([name]) 6.5 - 8.1 g/dL   Albumin 2.8 ([name]) 3.5 - 5.0 g/dL   AST 29 15 - 41 U/[name]   ALT 70 (H) 17 - 63 U/[name]   Alkaline Phosphatase 56 38 - 126 U/[name]   Total Bilirubin 0.8 0.3 - 1.2 mg/dL   GFR calc non Af Amer 52 ([name]) >60 mL/min   GFR calc Af Amer 60 ([name]) >60 mL/min    Comment: (NOTE) The eGFR has been calculated using the CKD EPI equation. This calculation has not been validated in all clinical situations. eGFR's persistently <60 mL/min signify possible Chronic Kidney Disease.    Anion gap 12 5 - 15  CBC     Status: Abnormal   Collection Time: 02/19/16  5:00 AM  Result  Value Ref Range   WBC 23.6 (H) 4.0 - 10.5 K/uL   RBC 4.95 4.22 - 5.81 MIL/uL   Hemoglobin 15.6 13.0 - 17.0 g/dL   HCT 49.2 39.0 - 52.0 %   MCV 99.4 78.0 - 100.0 fL   MCH 31.5 26.0 - 34.0 pg   MCHC 31.7 30.0 - 36.0 g/dL   RDW 15.7 (H) 11.5 - 15.5 %   Platelets 144 ([name]) 150 - 400 K/uL    Comment: SPECIMEN CHECKED FOR CLOTS PLATELET COUNT CONFIRMED BY SMEAR   Blood gas, arterial     Status: Abnormal   Collection Time: 02/19/16  5:45 AM  Result Value Ref Range   FIO2 30.00    Delivery systems VENTILATOR    Mode PRESSURE REGULATED VOLUME CONTROL    VT 550 mL   LHR 20 resp/min   Peep/cpap 5.0 cm H20   pH, Arterial 7.401 7.350 - 7.450   pCO2 arterial 52.3 (H) 32.0 - 48.0 mmHg   pO2, Arterial 80.5 ([name]) 83.0 - 108.0 mmHg   Bicarbonate 29.6 (H) 20.0 - 28.0 mmol/[name]   Acid-Base Excess 7.0 (H) 0.0 - 2.0 mmol/[name]   O2 Saturation 95.1 %   Collection site RADIAL    Drawn by [id number]    Sample  type ARTERIAL    Allens test (pass/fail) PASS PASS  C difficile quick scan w PCR reflex     Status: None   Collection Time: 02/19/16  6:00 AM  Result Value Ref Range   C Diff antigen NEGATIVE NEGATIVE   C Diff toxin NEGATIVE NEGATIVE   C Diff interpretation No C. difficile detected.   Glucose, capillary     Status: Abnormal   Collection Time: 02/19/16  7:32 AM  Result Value Ref Range   Glucose-Capillary 173 (H) 65 - 99 mg/dL  Culture, blood (Routine X 2) w Reflex to ID Panel     Status: None (Preliminary result)   Collection Time: 02/19/16  8:52 AM  Result Value Ref Range   Specimen Description BLOOD BLOOD RIGHT HAND    Special Requests BOTTLES DRAWN AEROBIC AND ANAEROBIC 6CC    Culture NO GROWTH < 12 HOURS    Report Status PENDING   Culture, blood (Routine X 2) w Reflex to ID Panel     Status: None (Preliminary result)   Collection Time: 02/19/16  9:07 AM  Result Value Ref Range   Specimen Description BLOOD BLOOD RIGHT HAND    Special Requests BOTTLES DRAWN AEROBIC AND ANAEROBIC 6CC    Culture NO GROWTH < 12 HOURS    Report Status PENDING   Glucose, capillary     Status: Abnormal   Collection Time: 02/19/16 11:43 AM  Result Value Ref Range   Glucose-Capillary 170 (H) 65 - 99 mg/dL  Glucose, capillary     Status: Abnormal   Collection Time: 02/19/16  3:47 PM  Result Value Ref Range   Glucose-Capillary 193 (H) 65 - 99 mg/dL  Glucose, capillary     Status: Abnormal   Collection Time: 02/19/16  7:35 PM  Result Value Ref Range   Glucose-Capillary 172 (H) 65 - 99 mg/dL   Comment 1 Notify RN   Glucose, capillary     Status: Abnormal   Collection Time: 02/20/16 12:01 AM  Result Value Ref Range   Glucose-Capillary 235 (H) 65 - 99 mg/dL   Comment 1 Notify RN   Glucose, capillary     Status: Abnormal   Collection Time: 02/20/16  4:28 AM  Result  Value Ref Range   Glucose-Capillary 208 (H) 65 - 99 mg/dL  Basic metabolic panel     Status: Abnormal   Collection Time: 02/20/16  4:29  AM  Result Value Ref Range   Sodium 153 (H) 135 - 145 mmol/[name]   Potassium 4.0 3.5 - 5.1 mmol/[name]   Chloride 110 101 - 111 mmol/[name]   CO2 36 (H) 22 - 32 mmol/[name]   Glucose, Bld 209 (H) 65 - 99 mg/dL   BUN 76 (H) 6 - 20 mg/dL   Creatinine, Ser 1.46 (H) 0.61 - 1.24 mg/dL   Calcium 7.3 ([name]) 8.9 - 10.3 mg/dL   GFR calc non Af Amer 45 ([name]) >60 mL/min   GFR calc Af Amer 52 ([name]) >60 mL/min    Comment: (NOTE) The eGFR has been calculated using the CKD EPI equation. This calculation has not been validated in all clinical situations. eGFR's persistently <60 mL/min signify possible Chronic Kidney Disease.    Anion gap 7 5 - 15  CBC     Status: Abnormal   Collection Time: 02/20/16  4:29 AM  Result Value Ref Range   WBC 15.6 (H) 4.0 - 10.5 K/uL   RBC 4.39 4.22 - 5.81 MIL/uL   Hemoglobin 13.8 13.0 - 17.0 g/dL   HCT 43.9 39.0 - 52.0 %   MCV 100.0 78.0 - 100.0 fL   MCH 31.4 26.0 - 34.0 pg   MCHC 31.4 30.0 - 36.0 g/dL   RDW 15.6 (H) 11.5 - 15.5 %   Platelets 102 ([name]) 150 - 400 K/uL    Comment: SPECIMEN CHECKED FOR CLOTS PLATELET COUNT CONFIRMED BY SMEAR   Glucose, capillary     Status: Abnormal   Collection Time: 02/20/16  7:17 AM  Result Value Ref Range   Glucose-Capillary 178 (H) 65 - 99 mg/dL    ABGS  Recent Labs  02/19/16 0545  PHART 7.401  PO2ART 80.5*  HCO3 29.6*   CULTURES Recent Results (from the past 240 hour(s))  Urine culture     Status: None   Collection Time: 02/16/16  7:39 AM  Result Value Ref Range Status   Specimen Description URINE, CLEAN CATCH  Final   Special Requests NONE  Final   Culture   Final    NO GROWTH Performed at [facility], [address], [place], [place] [address]    Report Status 02/17/2016 FINAL  Final  Culture, blood (Routine X 2) w Reflex to ID Panel     Status: None (Preliminary result)   Collection Time: 02/16/16  7:56 AM  Result Value Ref Range Status   Specimen Description BLOOD RIGHT HAND  Final   Special Requests BOTTLES DRAWN  AEROBIC AND ANAEROBIC 6CC  Final   Culture NO GROWTH 3 DAYS  Final   Report Status PENDING  Incomplete  Culture, blood (Routine X 2) w Reflex to ID Panel     Status: None (Preliminary result)   Collection Time: 02/16/16 10:01 AM  Result Value Ref Range Status   Specimen Description BLOOD RIGHT HAND  Final   Special Requests BOTTLES DRAWN AEROBIC ONLY 6CC  Final   Culture NO GROWTH 3 DAYS  Final   Report Status PENDING  Incomplete  Culture, respiratory (NON-Expectorated)     Status: None   Collection Time: 02/17/16 11:21 AM  Result Value Ref Range Status   Specimen Description TRACHEAL ASPIRATE  Final   Special Requests NONE  Final   Gram Stain   Final  RARE SQUAMOUS EPITHELIAL CELLS PRESENT MODERATE WBC PRESENT,BOTH PMN AND MONONUCLEAR FEW YEAST Performed at [facility], [place] [address], [place], [place] [address]    Culture FEW CANDIDA ALBICANS  Final   Report Status 02/19/2016 FINAL  Final  C difficile quick scan w PCR reflex     Status: None   Collection Time: 02/19/16  6:00 AM  Result Value Ref Range Status   C Diff antigen NEGATIVE NEGATIVE Final   C Diff toxin NEGATIVE NEGATIVE Final   C Diff interpretation No C. difficile detected.  Final  Culture, blood (Routine X 2) w Reflex to ID Panel     Status: None (Preliminary result)   Collection Time: 02/19/16  8:52 AM  Result Value Ref Range Status   Specimen Description BLOOD BLOOD RIGHT HAND  Final   Special Requests BOTTLES DRAWN AEROBIC AND ANAEROBIC 6CC  Final   Culture NO GROWTH < 12 HOURS  Final   Report Status PENDING  Incomplete  Culture, blood (Routine X 2) w Reflex to ID Panel     Status: None (Preliminary result)   Collection Time: 02/19/16  9:07 AM  Result Value Ref Range Status   Specimen Description BLOOD BLOOD RIGHT HAND  Final   Special Requests BOTTLES DRAWN AEROBIC AND ANAEROBIC 6CC  Final   Culture NO GROWTH < 12 HOURS  Final   Report Status PENDING  Incomplete   Studies/Results: US Abdomen  Limited Ruq  Result Date: 02/19/2016 CLINICAL DATA:  Elevated LFTs EXAM: US ABDOMEN LIMITED - RIGHT UPPER QUADRANT COMPARISON:  None. FINDINGS: Gallbladder: High echogenicity material seen in the gallbladder with only mild shadowing. Gallbladder wall thickening measures 3.6 mm. The patient is on a ventilator and a Murphy's sign cannot be well assessed. No pericholecystic fluid. Common bile duct: Diameter: 4.5 mm Liver: 2.7 x 4.2 x 4.1 cm right hepatic lobe cyst. IMPRESSION: 1. Layering material posteriorly in the gallbladder with mild shadowing likely represents sludge and stones with associated wall thickening but no pericholecystic fluid. The common bile duct is normal in caliber. The patient cannot be assessed for Murphy's sign as he is on a ventilator. If there is concern for acute cholecystitis, recommend HIDA scan. Electronically Signed   By: Dorise Bullion III M.D   On: 02/19/2016 11:48    Medications:  Prior to Admission:  Prescriptions Prior to Admission  Medication Sig Dispense Refill Last Dose  . albuterol (PROVENTIL HFA;VENTOLIN HFA) 108 (90 Base) MCG/ACT inhaler Inhale 1-2 puffs into the lungs every 6 (six) hours as needed for wheezing or shortness of breath. 1 Inhaler 0 Past Week at Unknown time  . albuterol (PROVENTIL) (2.5 MG/3ML) 0.083% nebulizer solution Take 3 mLs (2.5 mg total) by nebulization every 6 (six) hours as needed for wheezing or shortness of breath. 150 mL 1 Past Week at Unknown time  . ALPRAZolam (XANAX) 1 MG tablet Take 1 tablet (1 mg total) by mouth 3 (three) times daily as needed for anxiety. 90 tablet 1 Past Week at Unknown time  . amLODipine (NORVASC) 10 MG tablet Take 10 mg by mouth daily.   Past Week at Unknown time  . budesonide-formoterol (SYMBICORT) 160-4.5 MCG/ACT inhaler Inhale 2 puffs into the lungs 2 (two) times daily.   Past Week at Unknown time  . FLUoxetine (PROZAC) 40 MG capsule Take 1 capsule (40 mg total) by mouth daily. 30 capsule 3 Past Week at  Unknown time  . ibuprofen (ADVIL,MOTRIN) 200 MG tablet Take 200 mg by mouth  every 6 (six) hours as needed for headache, mild pain or moderate pain. For pain    Past Week at Unknown time  . Ivermectin 0.5 % LOTN Apply thoroughly to dry hair and scalp; leave lotion on hair for 10 minutes, and then rinse with water. 1 Tube 1 Past Week at Unknown time  . loratadine (CLARITIN) 10 MG tablet Take 1 tablet (10 mg total) by mouth daily. 30 tablet 11 Past Month at Unknown time  . omeprazole (PRILOSEC) 20 MG capsule take 1 capsule by mouth once daily 30 capsule 3 Past Month at Unknown time  . propranolol (INDERAL) 10 MG tablet take 1 tablet by mouth twice a day for TREMOR 60 tablet 3 Past Week at Unknown time  . rosuvastatin (CRESTOR) 10 MG tablet take 1 tablet by mouth once daily 30 tablet 3 Past Week at Unknown time  . tiotropium (SPIRIVA) 18 MCG inhalation capsule Place 18 mcg into inhaler and inhale daily.   Past Week at Unknown time  . vitamin B-12 (CYANOCOBALAMIN) 1000 MCG tablet Take 1,000 mcg by mouth daily.   Past Week at Unknown time  . nitroGLYCERIN (NITROSTAT) 0.4 MG SL tablet Place 0.4 mg under the tongue every 5 (five) minutes as needed for chest pain.   unknown  . [DISCONTINUED] predniSONE (DELTASONE) 20 MG tablet 2 tabs po daily x 4 days 8 tablet 0    Scheduled: . chlorhexidine gluconate (MEDLINE KIT)  15 mL Mouth Rinse BID  . Chlorhexidine Gluconate Cloth  6 each Topical Daily  . enoxaparin (LOVENOX) injection  40 mg Subcutaneous Q24H  . famotidine (PEPCID) IV  20 mg Intravenous Q12H  . fentaNYL (SUBLIMAZE) injection  50 mcg Intravenous Once  . furosemide  40 mg Intravenous Q12H  . insulin aspart  0-9 Units Subcutaneous Q4H  . ipratropium-albuterol  3 mL Nebulization Q6H  . mouth rinse  15 mL Mouth Rinse QID  . methylPREDNISolone (SOLU-MEDROL) injection  60 mg Intravenous Q8H  . nicotine  21 mg Transdermal Daily  . polyvinyl alcohol  1 drop Both Eyes Q4H  . potassium chloride  40 mEq  Oral Daily  . propofol  20 mg Intravenous Once  . propofol (DIPRIVAN) infusion  5-80 mcg/kg/min Intravenous Once  . sodium chloride flush  10-40 mL Intracatheter Q12H   Continuous: . feeding supplement (VITAL AF 1.2 CAL) 1,000 mL (02/18/16 1636)  . fentaNYL infusion INTRAVENOUS 100 mcg/hr (02/20/16 0559)  . propofol (DIPRIVAN) infusion 30 mcg/kg/min (02/20/16 0606)   [id number], albuterol, bisacodyl, fentaNYL, sodium chloride, sodium chloride flush  Assesment: He was admitted with COPD exacerbation and acute on chronic hypoxic/hypercapnic respiratory failure. He has been on the ventilator since admission. Family conference is scheduled for today. Prognosis is extremely poor. Principal Problem:   COPD exacerbation ([place]) Active Problems:   COPD (chronic obstructive pulmonary disease) (HCC)   Essential hypertension, benign   Respiratory failure (HCC)   Acute on chronic respiratory failure with hypercapnia (HCC)   Respiratory disorder with ventilator dependence (HCC)   Fever   Acute bronchitis   Palliative care encounter   Goals of care, counseling/discussion   DNR (do not resuscitate) discussion   Ventilator dependence ([place])   Acute respiratory failure with hypoxia and hypercarbia (HCC)   Elevated troponin   FUO (fever of unknown origin)   Acute on chronic respiratory failure with hypoxia and hypercapnia (HCC)   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

## 2016-02-20 NOTE — Progress Notes (Signed)
Patient extubated, on 2L [place] now. Family at bedside.

## 2016-02-21 DIAGNOSIS — J441 Chronic obstructive pulmonary disease with (acute) exacerbation: Secondary | ICD-10-CM

## 2016-02-21 LAB — CULTURE, BLOOD (ROUTINE X 2)
Culture: NO GROWTH
Culture: NO GROWTH

## 2016-02-24 LAB — CULTURE, BLOOD (ROUTINE X 2)
CULTURE: NO GROWTH
Culture: NO GROWTH

## 2016-03-15 NOTE — Progress Notes (Signed)
RN reported and pronounced patient's death at 1am on 22-Mar-2016. Death certificate filled out by me.  Orvan Falconer MD FACP.  Hospitalist.

## 2016-03-15 NOTE — Progress Notes (Signed)
200cc of remaining fentanyl drip wasted in sink with T.Roberts, RN. Disposed of bag in proper container. Pt wife at bedside. Post mortem care complete.

## 2016-03-15 DEATH — deceased

## 2016-10-02 IMAGING — CT CT HEAD W/O CM
1 series · 16 of 30 positions shown, 20 images · non-contrast
Comparison: None

CLINICAL DATA: Weakness from waist down beginning yesterday, fell
today but did not strike head, history hypertension, skin cancer,
COPD, hyperlipidemia

EXAM:
CT HEAD WITHOUT CONTRAST
TECHNIQUE: Contiguous axial images were obtained from the base of the skull
through the vertex without intravenous contrast.

[Series 2: headseq 4.8 h37s · axial · 0.47mm/px · z∈[+120,+255]mm · 16 of 30 slices shown, 20 images]
[im 2/30  brain]
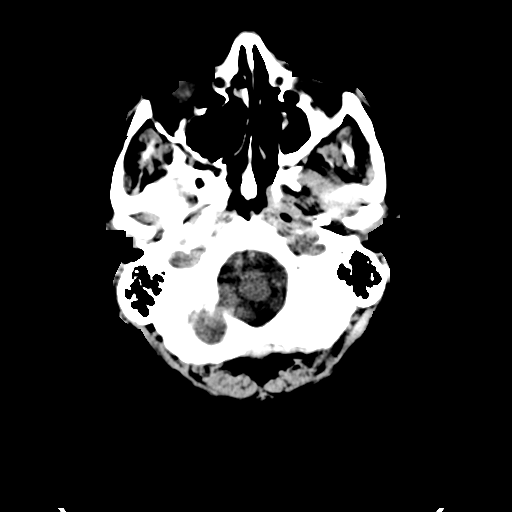
[im 2/30  bone]
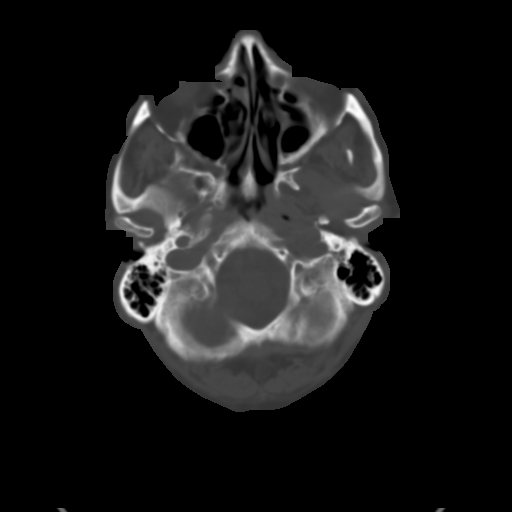
[im 4/30  brain]
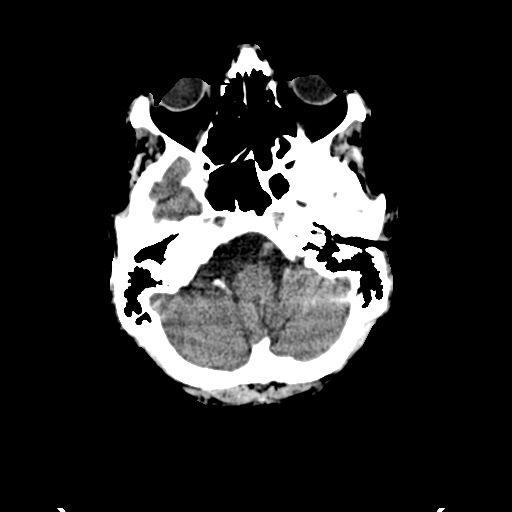
[im 6/30  brain]
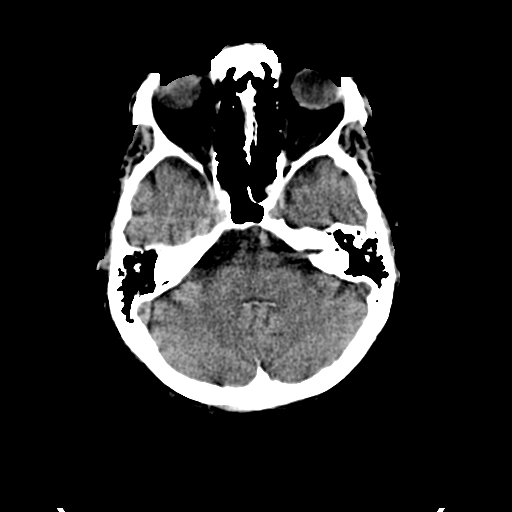
[im 8/30  brain]
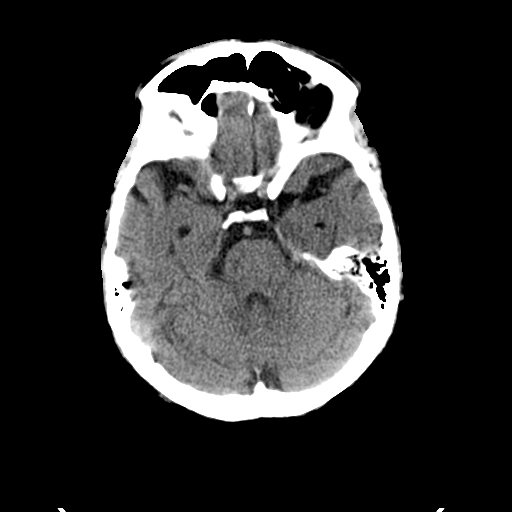
[im 9/30  brain]
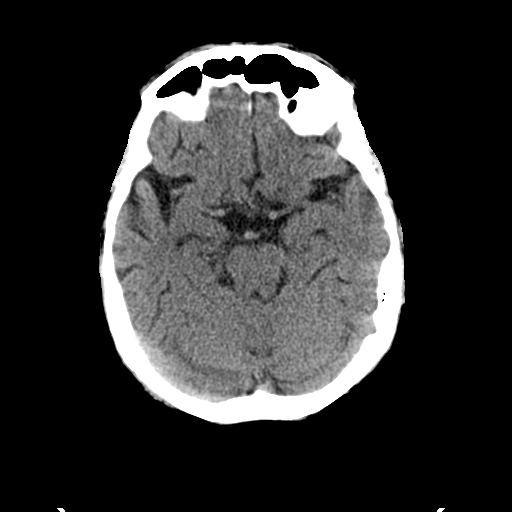
[im 9/30  bone]
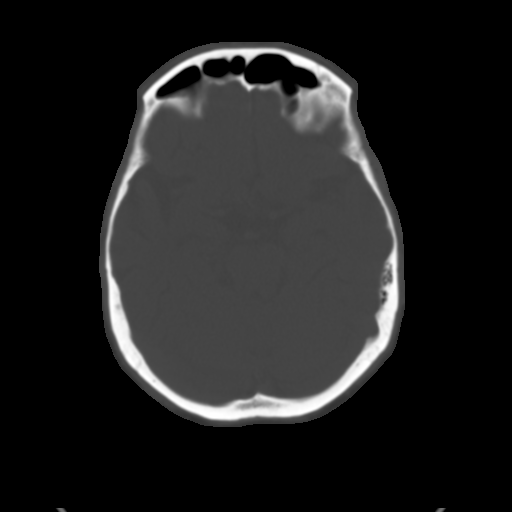
[im 11/30  brain]
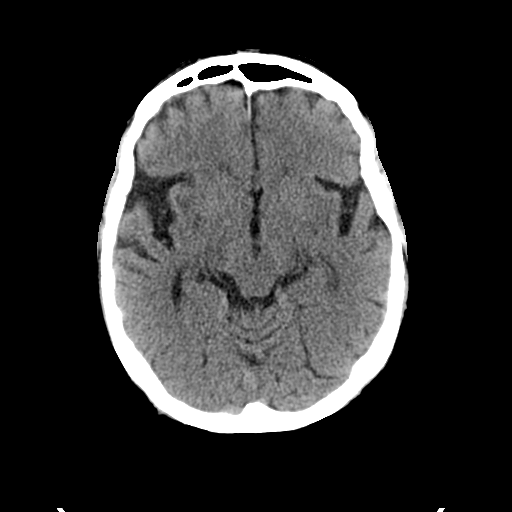
[im 13/30  brain]
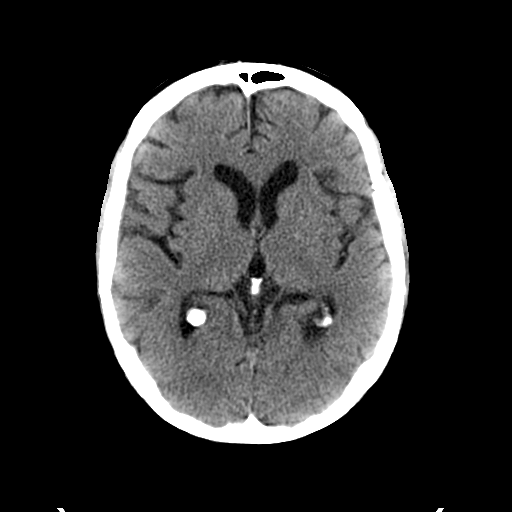
[im 15/30  brain]
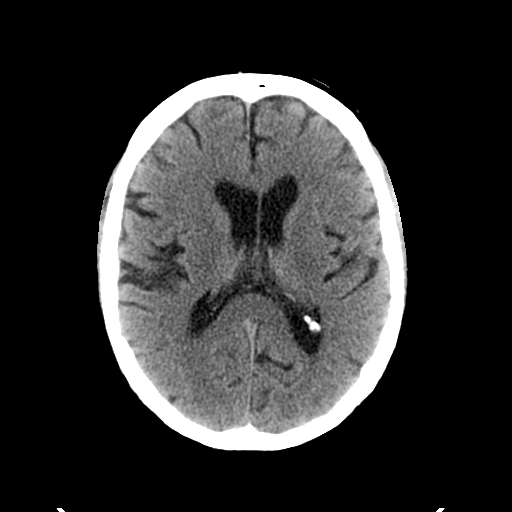
[im 16/30  brain]
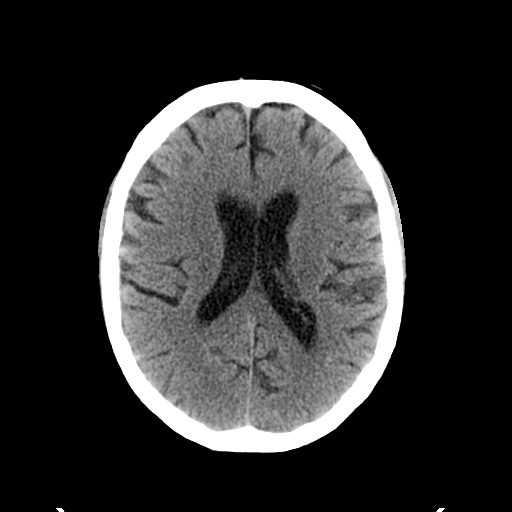
[im 16/30  bone]
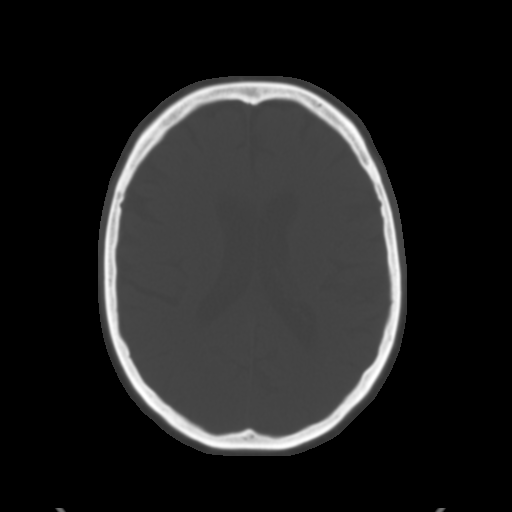
[im 18/30  brain]
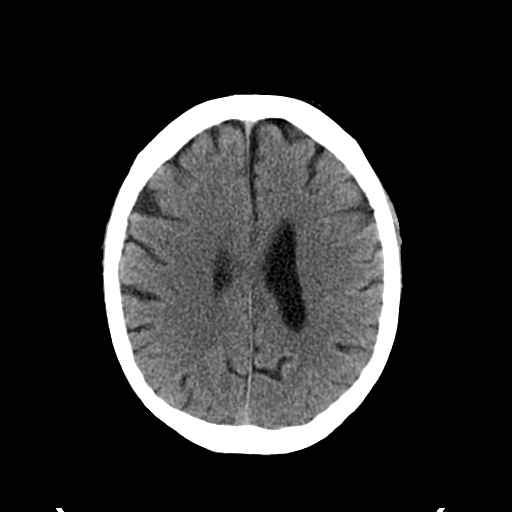
[im 20/30  brain]
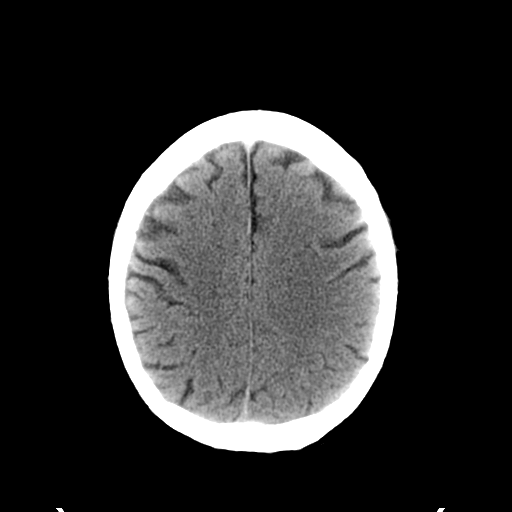
[im 22/30  brain]
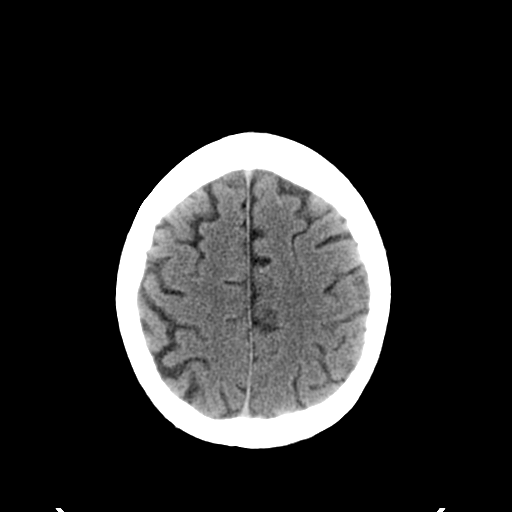
[im 23/30  brain]
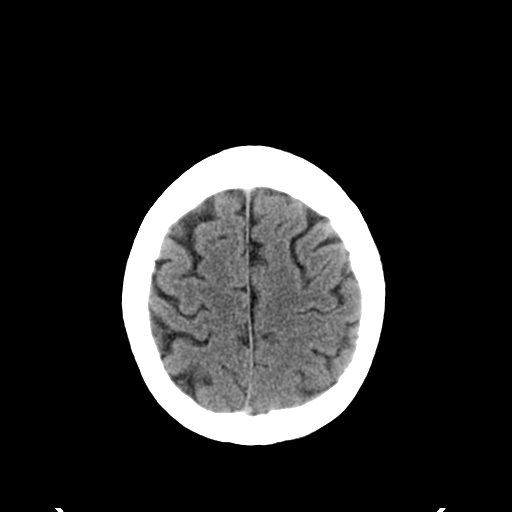
[im 23/30  bone]
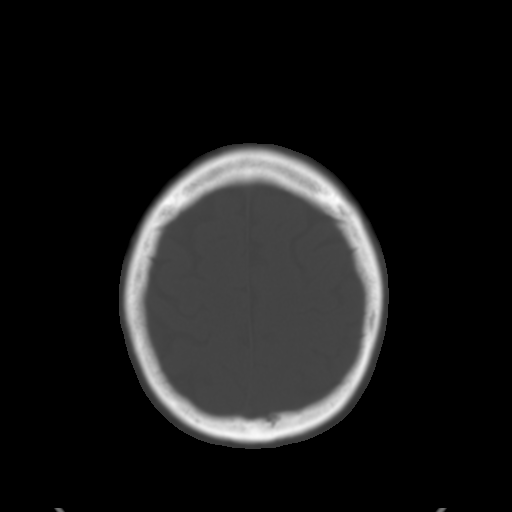
[im 25/30  brain]
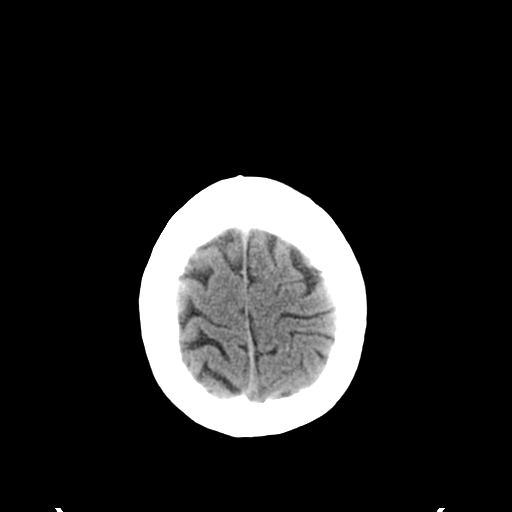
[im 27/30  brain]
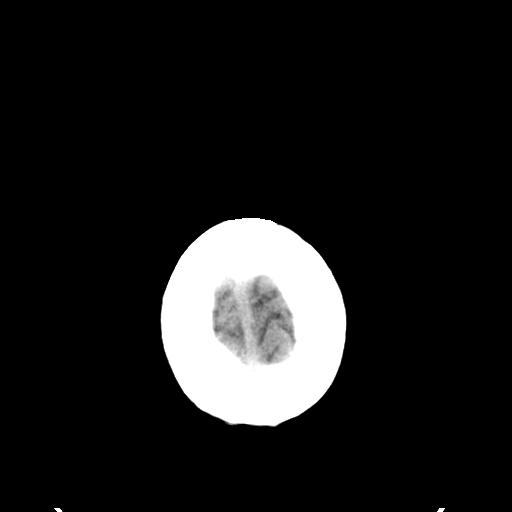
[im 29/30  brain]
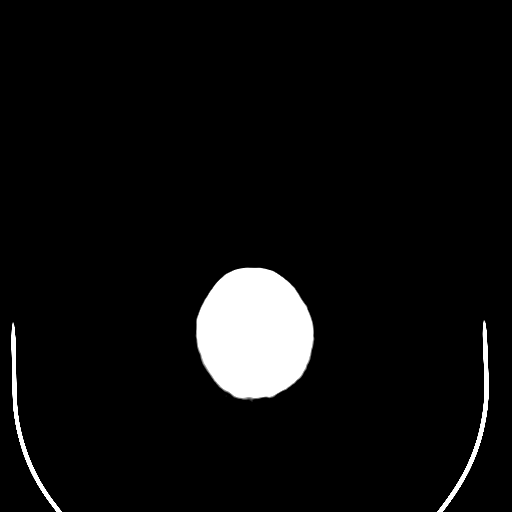

[16 of 30 positions shown; findings below may reference images not displayed]

FINDINGS: Mild generalized atrophy.

Normal ventricular morphology.

No midline shift or mass effect.

No extra-axial fluid collections.

Fluid versus mucus within LEFT maxillary sinus.

Bones and sinuses otherwise unremarkable.
IMPRESSION: No acute intracranial abnormalities.

Small amount of fluid or mucus within LEFT maxillary sinus.
